# Patient Record
Sex: Female | Born: 1955 | Race: Black or African American | Hispanic: No | Marital: Married | Smoking: Never smoker
Health system: Southern US, Community
[De-identification: ages and names within clinical notes are randomized; demographics above are authoritative.]

## PROBLEM LIST (undated history)

## (undated) DIAGNOSIS — J302 Other seasonal allergic rhinitis: Secondary | ICD-10-CM

## (undated) DIAGNOSIS — E119 Type 2 diabetes mellitus without complications: Secondary | ICD-10-CM

## (undated) DIAGNOSIS — I10 Essential (primary) hypertension: Secondary | ICD-10-CM

## (undated) DIAGNOSIS — R7302 Impaired glucose tolerance (oral): Secondary | ICD-10-CM

## (undated) DIAGNOSIS — E785 Hyperlipidemia, unspecified: Secondary | ICD-10-CM

## (undated) DIAGNOSIS — K5900 Constipation, unspecified: Secondary | ICD-10-CM

## (undated) DIAGNOSIS — E669 Obesity, unspecified: Secondary | ICD-10-CM

## (undated) HISTORY — DX: Hyperlipidemia, unspecified: E78.5

## (undated) HISTORY — PX: TUBAL LIGATION: SHX77

## (undated) HISTORY — DX: Essential (primary) hypertension: I10

## (undated) HISTORY — DX: Impaired glucose tolerance (oral): R73.02

## (undated) HISTORY — DX: Constipation, unspecified: K59.00

## (undated) HISTORY — PX: CYSTECTOMY: SUR359

## (undated) HISTORY — PX: CYSTECTOMY: SHX5119

## (undated) HISTORY — DX: Obesity, unspecified: E66.9

---

## 2002-05-04 ENCOUNTER — Ambulatory Visit (HOSPITAL_COMMUNITY): Admission: RE | Admit: 2002-05-04 | Discharge: 2002-05-04 | Payer: Self-pay | Admitting: Family Medicine

## 2002-08-31 ENCOUNTER — Ambulatory Visit (HOSPITAL_COMMUNITY): Admission: RE | Admit: 2002-08-31 | Discharge: 2002-08-31 | Payer: Self-pay | Admitting: Family Medicine

## 2002-08-31 ENCOUNTER — Encounter: Payer: Self-pay | Admitting: Family Medicine

## 2005-03-17 ENCOUNTER — Ambulatory Visit: Payer: Self-pay | Admitting: Family Medicine

## 2005-03-30 ENCOUNTER — Ambulatory Visit (HOSPITAL_COMMUNITY): Admission: RE | Admit: 2005-03-30 | Discharge: 2005-03-30 | Payer: Self-pay | Admitting: Family Medicine

## 2005-11-04 ENCOUNTER — Ambulatory Visit: Payer: Self-pay | Admitting: Family Medicine

## 2006-05-25 ENCOUNTER — Other Ambulatory Visit: Admission: RE | Admit: 2006-05-25 | Discharge: 2006-05-25 | Payer: Self-pay | Admitting: Family Medicine

## 2006-05-25 ENCOUNTER — Encounter: Payer: Self-pay | Admitting: Family Medicine

## 2006-05-25 ENCOUNTER — Ambulatory Visit: Payer: Self-pay | Admitting: Family Medicine

## 2006-05-25 LAB — CONVERTED CEMR LAB: Pap Smear: NORMAL

## 2006-05-28 ENCOUNTER — Encounter: Payer: Self-pay | Admitting: Family Medicine

## 2006-05-28 LAB — CONVERTED CEMR LAB
BUN: 15 mg/dL (ref 6–23)
Basophils Absolute: 0.1 10*3/uL (ref 0.0–0.1)
Basophils Relative: 1 % (ref 0–1)
CO2: 27 meq/L (ref 19–32)
Calcium: 9.3 mg/dL (ref 8.4–10.5)
Chloride: 103 meq/L (ref 96–112)
Cholesterol: 234 mg/dL — ABNORMAL HIGH (ref 0–200)
Creatinine, Ser: 0.81 mg/dL (ref 0.40–1.20)
Eosinophils Absolute: 0.2 10*3/uL (ref 0.0–0.7)
Eosinophils Relative: 3 % (ref 0–5)
Glucose, Bld: 112 mg/dL — ABNORMAL HIGH (ref 70–99)
HCT: 41.3 % (ref 36.0–46.0)
HDL: 69 mg/dL (ref >39)
Helicobacter Pylori Antibody-IgG: 7.2 — ABNORMAL HIGH
Hemoglobin: 13 g/dL (ref 12.0–15.0)
LDL Cholesterol: 151 mg/dL — ABNORMAL HIGH (ref 0–99)
Lymphocytes Relative: 60 % — ABNORMAL HIGH (ref 12–46)
Lymphs Abs: 3.5 10*3/uL — ABNORMAL HIGH (ref 0.7–3.3)
MCHC: 31.5 g/dL (ref 30.0–36.0)
MCV: 85.3 fL (ref 78.0–100.0)
Monocytes Absolute: 0.5 10*3/uL (ref 0.2–0.7)
Monocytes Relative: 8 % (ref 3–11)
Neutro Abs: 1.7 10*3/uL (ref 1.7–7.7)
Neutrophils Relative %: 30 % — ABNORMAL LOW (ref 43–77)
Platelets: 376 10*3/uL (ref 150–400)
Potassium: 4.4 meq/L (ref 3.5–5.3)
RBC: 4.84 M/uL (ref 3.87–5.11)
RDW: 14.9 % — ABNORMAL HIGH (ref 11.5–14.0)
Sodium: 139 meq/L (ref 135–145)
TSH: 3.336 microintl units/mL (ref 0.350–5.50)
Total CHOL/HDL Ratio: 3.4
Triglycerides: 71 mg/dL (ref <150)
VLDL: 14 mg/dL (ref 0–40)
WBC: 5.9 10*3/uL (ref 4.0–10.5)

## 2006-05-31 ENCOUNTER — Encounter: Payer: Self-pay | Admitting: Family Medicine

## 2006-05-31 LAB — CONVERTED CEMR LAB: Hgb A1c MFr Bld: 6.5 % — ABNORMAL HIGH (ref 4.6–6.1)

## 2006-06-28 ENCOUNTER — Ambulatory Visit (HOSPITAL_COMMUNITY): Admission: RE | Admit: 2006-06-28 | Discharge: 2006-06-28 | Payer: Self-pay | Admitting: Family Medicine

## 2006-07-13 ENCOUNTER — Ambulatory Visit: Payer: Self-pay | Admitting: Family Medicine

## 2006-07-14 ENCOUNTER — Encounter: Payer: Self-pay | Admitting: Family Medicine

## 2006-07-14 LAB — CONVERTED CEMR LAB
Chlamydia, DNA Probe: NEGATIVE
GC Probe Amp, Genital: NEGATIVE

## 2006-07-15 ENCOUNTER — Encounter: Payer: Self-pay | Admitting: Family Medicine

## 2006-07-15 LAB — CONVERTED CEMR LAB
Candida species: NEGATIVE
Gardnerella vaginalis: NEGATIVE
Trichomonal Vaginitis: POSITIVE — AB

## 2006-07-20 ENCOUNTER — Encounter: Payer: Self-pay | Admitting: Family Medicine

## 2006-07-20 LAB — CONVERTED CEMR LAB
Glucose, 2 hour: 189 mg/dL — ABNORMAL HIGH (ref 70–139)
Glucose, Fasting: 113 mg/dL — ABNORMAL HIGH (ref 70–99)

## 2006-08-26 ENCOUNTER — Ambulatory Visit: Payer: Self-pay | Admitting: Family Medicine

## 2006-11-27 ENCOUNTER — Encounter: Payer: Self-pay | Admitting: Family Medicine

## 2006-11-27 LAB — CONVERTED CEMR LAB
BUN: 12 mg/dL (ref 6–23)
CO2: 24 meq/L (ref 19–32)
Calcium: 9.5 mg/dL (ref 8.4–10.5)
Chloride: 105 meq/L (ref 96–112)
Cholesterol: 240 mg/dL — ABNORMAL HIGH (ref 0–200)
Creatinine, Ser: 0.73 mg/dL (ref 0.40–1.20)
Glucose, Bld: 100 mg/dL — ABNORMAL HIGH (ref 70–99)
HDL: 63 mg/dL (ref >39)
LDL Cholesterol: 165 mg/dL — ABNORMAL HIGH (ref 0–99)
Potassium: 4 meq/L (ref 3.5–5.3)
Sodium: 141 meq/L (ref 135–145)
Total CHOL/HDL Ratio: 3.8
Triglycerides: 59 mg/dL (ref <150)
VLDL: 12 mg/dL (ref 0–40)

## 2006-12-03 ENCOUNTER — Ambulatory Visit: Payer: Self-pay | Admitting: Family Medicine

## 2007-01-10 ENCOUNTER — Ambulatory Visit: Payer: Self-pay | Admitting: Family Medicine

## 2007-01-27 ENCOUNTER — Ambulatory Visit: Payer: Self-pay | Admitting: Family Medicine

## 2007-03-26 ENCOUNTER — Encounter: Payer: Self-pay | Admitting: Family Medicine

## 2007-03-26 LAB — CONVERTED CEMR LAB
Cholesterol: 224 mg/dL — ABNORMAL HIGH (ref 0–200)
HDL: 59 mg/dL (ref >39)
LDL Cholesterol: 152 mg/dL — ABNORMAL HIGH (ref 0–99)
Total CHOL/HDL Ratio: 3.8
Triglycerides: 66 mg/dL (ref <150)
VLDL: 13 mg/dL (ref 0–40)

## 2007-04-06 ENCOUNTER — Ambulatory Visit: Payer: Self-pay | Admitting: Family Medicine

## 2007-07-02 ENCOUNTER — Encounter: Payer: Self-pay | Admitting: Family Medicine

## 2007-07-02 LAB — CONVERTED CEMR LAB
ALT: 9 units/L (ref 0–35)
AST: 15 units/L (ref 0–37)
Albumin: 4.1 g/dL (ref 3.5–5.2)
Alkaline Phosphatase: 89 units/L (ref 39–117)
BUN: 13 mg/dL (ref 6–23)
Bilirubin, Direct: 0.1 mg/dL (ref 0.0–0.3)
CO2: 25 meq/L (ref 19–32)
Calcium: 9.2 mg/dL (ref 8.4–10.5)
Chloride: 106 meq/L (ref 96–112)
Cholesterol: 233 mg/dL — ABNORMAL HIGH (ref 0–200)
Creatinine, Ser: 0.72 mg/dL (ref 0.40–1.20)
Glucose, Bld: 100 mg/dL — ABNORMAL HIGH (ref 70–99)
HDL: 69 mg/dL (ref >39)
Indirect Bilirubin: 0.4 mg/dL (ref 0.0–0.9)
LDL Cholesterol: 151 mg/dL — ABNORMAL HIGH (ref 0–99)
Potassium: 4.2 meq/L (ref 3.5–5.3)
Sodium: 143 meq/L (ref 135–145)
Total Bilirubin: 0.5 mg/dL (ref 0.3–1.2)
Total CHOL/HDL Ratio: 3.4
Total Protein: 7.1 g/dL (ref 6.0–8.3)
Triglycerides: 66 mg/dL (ref <150)
VLDL: 13 mg/dL (ref 0–40)

## 2007-07-12 ENCOUNTER — Other Ambulatory Visit: Admission: RE | Admit: 2007-07-12 | Discharge: 2007-07-12 | Payer: Self-pay | Admitting: Family Medicine

## 2007-07-12 ENCOUNTER — Encounter: Payer: Self-pay | Admitting: Family Medicine

## 2007-07-12 ENCOUNTER — Ambulatory Visit: Payer: Self-pay | Admitting: Family Medicine

## 2007-07-19 ENCOUNTER — Encounter: Payer: Self-pay | Admitting: Family Medicine

## 2007-07-19 DIAGNOSIS — E1169 Type 2 diabetes mellitus with other specified complication: Secondary | ICD-10-CM | POA: Insufficient documentation

## 2007-07-19 DIAGNOSIS — E1159 Type 2 diabetes mellitus with other circulatory complications: Secondary | ICD-10-CM | POA: Insufficient documentation

## 2007-07-19 DIAGNOSIS — E785 Hyperlipidemia, unspecified: Secondary | ICD-10-CM | POA: Insufficient documentation

## 2007-07-19 DIAGNOSIS — E739 Lactose intolerance, unspecified: Secondary | ICD-10-CM | POA: Insufficient documentation

## 2007-07-19 DIAGNOSIS — I10 Essential (primary) hypertension: Secondary | ICD-10-CM | POA: Insufficient documentation

## 2007-08-24 ENCOUNTER — Ambulatory Visit (HOSPITAL_COMMUNITY): Admission: RE | Admit: 2007-08-24 | Discharge: 2007-08-24 | Payer: Self-pay | Admitting: Family Medicine

## 2007-10-08 ENCOUNTER — Encounter: Payer: Self-pay | Admitting: Family Medicine

## 2007-10-08 LAB — CONVERTED CEMR LAB
Cholesterol: 231 mg/dL — ABNORMAL HIGH (ref 0–200)
HDL: 69 mg/dL (ref >39)
LDL Cholesterol: 152 mg/dL — ABNORMAL HIGH (ref 0–99)
Total CHOL/HDL Ratio: 3.3
Triglycerides: 51 mg/dL (ref <150)
VLDL: 10 mg/dL (ref 0–40)

## 2007-10-17 ENCOUNTER — Ambulatory Visit: Payer: Self-pay | Admitting: Family Medicine

## 2007-10-17 LAB — CONVERTED CEMR LAB
Glucose, Bld: 95 mg/dL
Hgb A1c MFr Bld: 6.2 %

## 2007-12-20 ENCOUNTER — Telehealth: Payer: Self-pay | Admitting: Family Medicine

## 2008-01-10 ENCOUNTER — Encounter: Payer: Self-pay | Admitting: Family Medicine

## 2008-01-16 LAB — CONVERTED CEMR LAB
ALT: 9 units/L (ref 0–35)
AST: 16 units/L (ref 0–37)
Albumin: 4.3 g/dL (ref 3.5–5.2)
Alkaline Phosphatase: 96 units/L (ref 39–117)
Bilirubin, Direct: 0.1 mg/dL (ref 0.0–0.3)
Cholesterol: 232 mg/dL — ABNORMAL HIGH (ref 0–200)
HDL: 65 mg/dL (ref >39)
Indirect Bilirubin: 0.3 mg/dL (ref 0.0–0.9)
LDL Cholesterol: 153 mg/dL — ABNORMAL HIGH (ref 0–99)
Total Bilirubin: 0.4 mg/dL (ref 0.3–1.2)
Total CHOL/HDL Ratio: 3.6
Total Protein: 7.4 g/dL (ref 6.0–8.3)
Triglycerides: 72 mg/dL (ref <150)
VLDL: 14 mg/dL (ref 0–40)

## 2008-01-17 ENCOUNTER — Ambulatory Visit: Payer: Self-pay | Admitting: Family Medicine

## 2008-01-17 DIAGNOSIS — N950 Postmenopausal bleeding: Secondary | ICD-10-CM | POA: Insufficient documentation

## 2008-01-17 LAB — CONVERTED CEMR LAB: Hgb A1c MFr Bld: 6 %

## 2008-01-19 ENCOUNTER — Telehealth: Payer: Self-pay | Admitting: Family Medicine

## 2008-02-13 ENCOUNTER — Ambulatory Visit: Payer: Self-pay | Admitting: Gastroenterology

## 2008-02-13 ENCOUNTER — Encounter: Payer: Self-pay | Admitting: Gastroenterology

## 2008-04-25 ENCOUNTER — Telehealth: Payer: Self-pay | Admitting: Family Medicine

## 2008-05-03 ENCOUNTER — Ambulatory Visit (HOSPITAL_COMMUNITY): Admission: RE | Admit: 2008-05-03 | Discharge: 2008-05-03 | Payer: Self-pay | Admitting: Internal Medicine

## 2008-05-12 ENCOUNTER — Encounter: Payer: Self-pay | Admitting: Family Medicine

## 2008-05-14 LAB — CONVERTED CEMR LAB
ALT: 8 units/L (ref 0–35)
AST: 14 units/L (ref 0–37)
Albumin: 4.3 g/dL (ref 3.5–5.2)
Alkaline Phosphatase: 85 units/L (ref 39–117)
BUN: 14 mg/dL (ref 6–23)
Bilirubin, Direct: 0.1 mg/dL (ref 0.0–0.3)
CO2: 27 meq/L (ref 19–32)
Calcium: 9.3 mg/dL (ref 8.4–10.5)
Chloride: 104 meq/L (ref 96–112)
Cholesterol: 153 mg/dL (ref 0–200)
Creatinine, Ser: 0.78 mg/dL (ref 0.40–1.20)
Glucose, Bld: 92 mg/dL (ref 70–99)
HDL: 65 mg/dL (ref >39)
Indirect Bilirubin: 0.2 mg/dL (ref 0.0–0.9)
LDL Cholesterol: 77 mg/dL (ref 0–99)
Potassium: 4.1 meq/L (ref 3.5–5.3)
Sodium: 142 meq/L (ref 135–145)
Total Bilirubin: 0.3 mg/dL (ref 0.3–1.2)
Total CHOL/HDL Ratio: 2.4
Total Protein: 7 g/dL (ref 6.0–8.3)
Triglycerides: 54 mg/dL (ref <150)
VLDL: 11 mg/dL (ref 0–40)

## 2008-05-17 ENCOUNTER — Ambulatory Visit: Payer: Self-pay | Admitting: Family Medicine

## 2008-05-20 DIAGNOSIS — E669 Obesity, unspecified: Secondary | ICD-10-CM | POA: Insufficient documentation

## 2008-05-20 DIAGNOSIS — E663 Overweight: Secondary | ICD-10-CM | POA: Insufficient documentation

## 2008-06-29 ENCOUNTER — Encounter: Payer: Self-pay | Admitting: Family Medicine

## 2008-09-17 ENCOUNTER — Encounter: Payer: Self-pay | Admitting: Family Medicine

## 2008-09-17 ENCOUNTER — Other Ambulatory Visit: Admission: RE | Admit: 2008-09-17 | Discharge: 2008-09-17 | Payer: Self-pay | Admitting: Family Medicine

## 2008-09-17 ENCOUNTER — Ambulatory Visit: Payer: Self-pay | Admitting: Family Medicine

## 2008-09-17 LAB — CONVERTED CEMR LAB
Hgb A1c MFr Bld: 6.2 %
OCCULT 1: NEGATIVE

## 2008-09-21 ENCOUNTER — Telehealth: Payer: Self-pay | Admitting: Family Medicine

## 2008-10-03 ENCOUNTER — Ambulatory Visit (HOSPITAL_COMMUNITY): Admission: RE | Admit: 2008-10-03 | Discharge: 2008-10-03 | Payer: Self-pay | Admitting: Pediatrics

## 2009-01-01 ENCOUNTER — Telehealth: Payer: Self-pay | Admitting: Family Medicine

## 2009-01-15 ENCOUNTER — Ambulatory Visit: Payer: Self-pay | Admitting: Family Medicine

## 2009-01-15 LAB — CONVERTED CEMR LAB: Hgb A1c MFr Bld: 6 %

## 2009-01-16 LAB — CONVERTED CEMR LAB
ALT: 11 units/L (ref 0–35)
AST: 16 units/L (ref 0–37)
Albumin: 4.3 g/dL (ref 3.5–5.2)
Alkaline Phosphatase: 82 units/L (ref 39–117)
BUN: 11 mg/dL (ref 6–23)
Basophils Absolute: 0 10*3/uL (ref 0.0–0.1)
Basophils Relative: 1 % (ref 0–1)
Bilirubin, Direct: 0.1 mg/dL (ref 0.0–0.3)
CO2: 23 meq/L (ref 19–32)
Calcium: 9.6 mg/dL (ref 8.4–10.5)
Chloride: 105 meq/L (ref 96–112)
Cholesterol: 174 mg/dL (ref 0–200)
Creatinine, Ser: 0.7 mg/dL (ref 0.40–1.20)
Eosinophils Absolute: 0.1 10*3/uL (ref 0.0–0.7)
Eosinophils Relative: 2 % (ref 0–5)
Glucose, Bld: 113 mg/dL — ABNORMAL HIGH (ref 70–99)
HCT: 38.5 % (ref 36.0–46.0)
HDL: 64 mg/dL (ref >39)
Hemoglobin: 12.6 g/dL (ref 12.0–15.0)
Indirect Bilirubin: 0.2 mg/dL (ref 0.0–0.9)
LDL Cholesterol: 102 mg/dL — ABNORMAL HIGH (ref 0–99)
Lymphocytes Relative: 53 % — ABNORMAL HIGH (ref 12–46)
Lymphs Abs: 2.4 10*3/uL (ref 0.7–4.0)
MCHC: 32.7 g/dL (ref 30.0–36.0)
MCV: 82.8 fL (ref 78.0–100.0)
Monocytes Absolute: 0.3 10*3/uL (ref 0.1–1.0)
Monocytes Relative: 8 % (ref 3–12)
Neutro Abs: 1.7 10*3/uL (ref 1.7–7.7)
Neutrophils Relative %: 38 % — ABNORMAL LOW (ref 43–77)
Platelets: 349 10*3/uL (ref 150–400)
Potassium: 3.9 meq/L (ref 3.5–5.3)
RBC: 4.65 M/uL (ref 3.87–5.11)
RDW: 14.8 % (ref 11.5–15.5)
Sodium: 143 meq/L (ref 135–145)
TSH: 3.518 microintl units/mL (ref 0.350–4.500)
Total Bilirubin: 0.3 mg/dL (ref 0.3–1.2)
Total CHOL/HDL Ratio: 2.7
Total Protein: 7 g/dL (ref 6.0–8.3)
Triglycerides: 41 mg/dL (ref <150)
VLDL: 8 mg/dL (ref 0–40)
WBC: 4.5 10*3/uL (ref 4.0–10.5)

## 2009-01-25 ENCOUNTER — Telehealth: Payer: Self-pay | Admitting: Family Medicine

## 2009-02-11 ENCOUNTER — Telehealth: Payer: Self-pay | Admitting: Family Medicine

## 2009-03-05 ENCOUNTER — Telehealth: Payer: Self-pay | Admitting: Family Medicine

## 2009-06-03 ENCOUNTER — Telehealth: Payer: Self-pay | Admitting: Family Medicine

## 2009-07-02 ENCOUNTER — Encounter (INDEPENDENT_AMBULATORY_CARE_PROVIDER_SITE_OTHER): Payer: Self-pay

## 2009-07-02 ENCOUNTER — Ambulatory Visit: Payer: Self-pay | Admitting: Family Medicine

## 2009-07-02 ENCOUNTER — Other Ambulatory Visit: Admission: RE | Admit: 2009-07-02 | Discharge: 2009-07-02 | Payer: Self-pay | Admitting: Family Medicine

## 2009-07-02 DIAGNOSIS — M79609 Pain in unspecified limb: Secondary | ICD-10-CM | POA: Insufficient documentation

## 2009-07-02 DIAGNOSIS — R7301 Impaired fasting glucose: Secondary | ICD-10-CM | POA: Insufficient documentation

## 2009-07-02 DIAGNOSIS — H6120 Impacted cerumen, unspecified ear: Secondary | ICD-10-CM | POA: Insufficient documentation

## 2009-07-02 LAB — CONVERTED CEMR LAB: OCCULT 1: NEGATIVE

## 2009-07-05 ENCOUNTER — Telehealth: Payer: Self-pay | Admitting: Family Medicine

## 2009-07-05 LAB — CONVERTED CEMR LAB
ALT: 15 units/L (ref 0–35)
AST: 20 units/L (ref 0–37)
Albumin: 4.4 g/dL (ref 3.5–5.2)
Alkaline Phosphatase: 89 units/L (ref 39–117)
BUN: 12 mg/dL (ref 6–23)
Bilirubin, Direct: 0.1 mg/dL (ref 0.0–0.3)
CO2: 27 meq/L (ref 19–32)
Calcium: 9.9 mg/dL (ref 8.4–10.5)
Chloride: 102 meq/L (ref 96–112)
Cholesterol: 186 mg/dL (ref 0–200)
Creatinine, Ser: 0.75 mg/dL (ref 0.40–1.20)
Glucose, Bld: 108 mg/dL — ABNORMAL HIGH (ref 70–99)
HDL: 62 mg/dL (ref >39)
Hgb A1c MFr Bld: 6.3 % — ABNORMAL HIGH (ref <5.7)
Indirect Bilirubin: 0.3 mg/dL (ref 0.0–0.9)
LDL Cholesterol: 110 mg/dL — ABNORMAL HIGH (ref 0–99)
Potassium: 4.1 meq/L (ref 3.5–5.3)
Sodium: 140 meq/L (ref 135–145)
Total Bilirubin: 0.4 mg/dL (ref 0.3–1.2)
Total CHOL/HDL Ratio: 3
Total Protein: 7.2 g/dL (ref 6.0–8.3)
Triglycerides: 71 mg/dL (ref <150)
VLDL: 14 mg/dL (ref 0–40)

## 2009-07-08 ENCOUNTER — Telehealth: Payer: Self-pay | Admitting: Family Medicine

## 2009-07-08 LAB — CONVERTED CEMR LAB: Pap Smear: NEGATIVE

## 2009-07-15 ENCOUNTER — Telehealth: Payer: Self-pay | Admitting: Family Medicine

## 2009-07-15 DIAGNOSIS — M25559 Pain in unspecified hip: Secondary | ICD-10-CM | POA: Insufficient documentation

## 2009-07-17 ENCOUNTER — Encounter: Payer: Self-pay | Admitting: Orthopedic Surgery

## 2009-07-17 ENCOUNTER — Ambulatory Visit (HOSPITAL_COMMUNITY): Admission: RE | Admit: 2009-07-17 | Discharge: 2009-07-17 | Payer: Self-pay | Admitting: Family Medicine

## 2009-07-18 ENCOUNTER — Telehealth: Payer: Self-pay | Admitting: Family Medicine

## 2009-09-12 ENCOUNTER — Ambulatory Visit: Payer: Self-pay | Admitting: Family Medicine

## 2009-09-12 DIAGNOSIS — L259 Unspecified contact dermatitis, unspecified cause: Secondary | ICD-10-CM | POA: Insufficient documentation

## 2009-09-19 ENCOUNTER — Ambulatory Visit: Payer: Self-pay | Admitting: Otolaryngology

## 2009-09-19 ENCOUNTER — Encounter: Payer: Self-pay | Admitting: Family Medicine

## 2009-09-24 DIAGNOSIS — B369 Superficial mycosis, unspecified: Secondary | ICD-10-CM | POA: Insufficient documentation

## 2009-10-14 ENCOUNTER — Ambulatory Visit: Payer: Self-pay | Admitting: Orthopedic Surgery

## 2009-10-14 DIAGNOSIS — M5430 Sciatica, unspecified side: Secondary | ICD-10-CM | POA: Insufficient documentation

## 2009-10-14 DIAGNOSIS — M549 Dorsalgia, unspecified: Secondary | ICD-10-CM | POA: Insufficient documentation

## 2009-10-18 ENCOUNTER — Telehealth: Payer: Self-pay | Admitting: Orthopedic Surgery

## 2009-11-12 ENCOUNTER — Telehealth (INDEPENDENT_AMBULATORY_CARE_PROVIDER_SITE_OTHER): Payer: Self-pay | Admitting: *Deleted

## 2009-11-18 LAB — CONVERTED CEMR LAB
ALT: 15 units/L (ref 0–35)
AST: 22 units/L (ref 0–37)
Albumin: 4.3 g/dL (ref 3.5–5.2)
Alkaline Phosphatase: 92 units/L (ref 39–117)
Bilirubin, Direct: 0.1 mg/dL (ref 0.0–0.3)
Cholesterol: 216 mg/dL — ABNORMAL HIGH (ref 0–200)
HDL: 67 mg/dL (ref >39)
Hgb A1c MFr Bld: 6.1 % — ABNORMAL HIGH (ref <5.7)
Indirect Bilirubin: 0.3 mg/dL (ref 0.0–0.9)
LDL Cholesterol: 138 mg/dL — ABNORMAL HIGH (ref 0–99)
Total Bilirubin: 0.4 mg/dL (ref 0.3–1.2)
Total CHOL/HDL Ratio: 3.2
Total Protein: 6.9 g/dL (ref 6.0–8.3)
Triglycerides: 57 mg/dL (ref <150)
VLDL: 11 mg/dL (ref 0–40)

## 2009-11-20 ENCOUNTER — Ambulatory Visit: Payer: Self-pay | Admitting: Family Medicine

## 2009-12-02 ENCOUNTER — Ambulatory Visit (HOSPITAL_COMMUNITY): Admission: RE | Admit: 2009-12-02 | Discharge: 2009-12-02 | Payer: Self-pay | Admitting: Family Medicine

## 2009-12-03 ENCOUNTER — Telehealth: Payer: Self-pay | Admitting: Orthopedic Surgery

## 2010-02-10 ENCOUNTER — Encounter: Payer: Self-pay | Admitting: Family Medicine

## 2010-02-18 ENCOUNTER — Telehealth: Payer: Self-pay | Admitting: Family Medicine

## 2010-02-20 NOTE — Progress Notes (Signed)
Summary: bw results  Phone Note Call from Patient   Summary of Call: wants results of blood work Initial call taken by: Lind Guest,  January 25, 2009 10:28 AM  Follow-up for Phone Call        returned call, left message Follow-up by: Worthy Keeler LPN,  January 25, 2009 10:30 AM  Additional Follow-up for Phone Call Additional follow up Details #1::        patient aware Additional Follow-up by: Worthy Keeler LPN,  January 25, 2009 11:46 AM

## 2010-02-20 NOTE — Letter (Signed)
Summary: Work Excuse  [facility]  [address]   [place], [place] [address]   Phone: [phone]  Fax: [phone]    Today's Date: July 02, 2009  Name of Patient: Megan Villa  The above named patient had a medical visit today at: 3:00pm.  Please take this into consideration when reviewing the time away from work/school.    Special Instructions:  [  ] None  [*  ] To be off the remainder of today, returning to the normal work / school schedule tomorrow.  [  ] To be off until the next scheduled appointment on ______________________.  [  ] Other ________________________________________________________________ ________________________________________________________________________   Sincerely yours,   Milus Mallick. Lodema Hong, M.D.

## 2010-02-20 NOTE — Progress Notes (Signed)
Summary: lovastatin 40 mg  Phone Note Call from Patient   Summary of Call: patient needs a prescription sent to [facility] in [place] for her lovastatin 40 mg. Initial call taken by: Curtis Sites,  November 12, 2009 5:02 PM  Follow-up for Phone Call        pls fax printed script to pharmacy after you spk with the pt Follow-up by: Syliva Overman MD,  November 12, 2009 5:53 PM  Additional Follow-up for Phone Call Additional follow up Details #1::        Rx Called In Additional Follow-up by: Adella Hare LPN,  November 13, 2009 1:08 PM    New/Updated Medications: LOVASTATIN 40 MG TABS (LOVASTATIN) Take 1 tab by mouth at bedtime Prescriptions: LOVASTATIN 40 MG TABS (LOVASTATIN) Take 1 tab by mouth at bedtime  #90 x 1   Entered by:   Syliva Overman MD   Authorized by:   Adella Hare LPN   Signed by:   Syliva Overman MD on 11/12/2009   Method used:   Printed then faxed to ...       [facility] [address] [facility] (retail)       [address]       [place], [place]  [address]       Ph: [phone]       Fax: [phone]   RxID:   [id number]

## 2010-02-20 NOTE — Progress Notes (Signed)
Summary: DR. Russella Dar  Phone Note Call from Patient   Summary of Call: FOR HER COLONOSCOPY SHE WANTS TO GO TO DR. Russella Dar IN [place] Initial call taken by: Lind Guest,  January 19, 2008 10:07 AM  Follow-up for Phone Call        pt has appt for consultation on jan 12th 9:45. and also a colon on jan 27th 7:30.  Follow-up by: Rudene Anda,  January 23, 2008 9:34 AM

## 2010-02-20 NOTE — Progress Notes (Signed)
Summary: medication side effect?  Phone Note Call from Patient   Caller: Patient Summary of Call: Patient relates that she had taken the Gabapentin and the Terbinifine at the same time, on 10/16/09, before bedtime, and had nausea and vomiting the next morning. Has not taken either since then. Her cell ph# [phone] Initial call taken by: Cammie Sickle,  October 18, 2009 1:01 PM  Follow-up for Phone Call        ok Follow-up by: Ether Griffins,  October 21, 2009 8:36 AM

## 2010-02-20 NOTE — Progress Notes (Signed)
Summary: patient did not wish to keep fol/up appt  Phone Note Outgoing Call   Summary of Call: When our office Steward Drone) called patient to remind of appt for tomorrow 12/04/09, patient stated she did not wish to keep the appointment. She stated that she had called after last visit about a question on the medication and that she did not receive a call back.   Steward Drone was about to defer her to me; or to Dr Romeo Apple; patient said to "just cancel the appointment." Initial call taken by: Cammie Sickle,  December 03, 2009 4:12 PM

## 2010-02-20 NOTE — Letter (Signed)
Summary: External Correspondence  External Correspondence   Imported By: Rudene Anda 06/29/2008 13:10:05  _____________________________________________________________________  External Attachment:    Type:   Image     Comment:   External Document

## 2010-02-20 NOTE — Progress Notes (Signed)
Summary: refill  Phone Note Call from Patient   Summary of Call: has no refills on her pot. pills [facility] [place]  appt in june  call back at [phone] to let her know Initial call taken by: Lind Guest,  Jun 03, 2009 8:10 AM  Follow-up for Phone Call        Rx Called In Follow-up by: Adella Hare LPN,  Jun 03, 2009 9:12 AM    Prescriptions: KLOR-CON M20 20 MEQ  CR-TABS (POTASSIUM CHLORIDE CRYS CR) one tab by mouth once daily  #30 Each x 0   Entered by:   Adella Hare LPN   Authorized by:   Syliva Overman MD   Signed by:   Adella Hare LPN on 16/10/9602   Method used:   Electronically to        [facility]  [address] (retail)       [address]       [place], [place]  [address]       Ph: [phone]       Fax: [phone]   RxID:   [id number]

## 2010-02-20 NOTE — Progress Notes (Signed)
  Phone Note Call from Patient   Summary of Call: The prednisone isn't working and she is still hurting on the right side of her butt like she has a pulled muscle. Wants to know what you want to do about it.  [facility] Initial call taken by: Everitt Amber LPN,  July 08, 2009 3:13 PM  Follow-up for Phone Call        advise muscle relaxant has been sent in see if she wants x ray of the hip if so pls order one verify with pt befope you order exactly whivch hip Follow-up by: Syliva Overman MD,  July 08, 2009 3:39 PM  Additional Follow-up for Phone Call Additional follow up Details #1::        patient aware patient says if gets no better, will do xray then Additional Follow-up by: Adella Hare LPN,  July 08, 2009 5:15 PM    New/Updated Medications: CYCLOBENZAPRINE HCL 10 MG TABS (CYCLOBENZAPRINE HCL) Take 1 tab by mouth at bedtime as needed Prescriptions: CYCLOBENZAPRINE HCL 10 MG TABS (CYCLOBENZAPRINE HCL) Take 1 tab by mouth at bedtime as needed  #30 x 0   Entered and Authorized by:   Syliva Overman MD   Signed by:   Syliva Overman MD on 07/08/2009   Method used:   Electronically to        [facility]  [address] (retail)       [address]       [place], [place]  [address]       Ph: [phone]       Fax: [phone]   RxID:   [id number]

## 2010-02-20 NOTE — Progress Notes (Signed)
Summary: x-ray  Phone Note Call from Patient   Summary of Call: pt would like to get a x-ray of side of leg. she said yall talked about it when she was in. and muscle relaxer not working. [phone] Initial call taken by: Rudene Anda,  July 15, 2009 8:18 AM  Follow-up for Phone Call        pls order x ray of right side of foot , ensure that is where she hurts, have her go get the film complaint is pain Follow-up by: Syliva Overman MD,  July 16, 2009 12:22 PM  Additional Follow-up for Phone Call Additional follow up Details #1::        returned call, left message Additional Follow-up by: Adella Hare LPN,  July 16, 2009 2:32 PM  New Problems: HIP PAIN, RIGHT (ICD-719.45)   Additional Follow-up for Phone Call Additional follow up Details #2::    patient complains of pain from right hip, into buttock,  down leg Follow-up by: Adella Hare LPN,  July 16, 2009 3:08 PM  Additional Follow-up for Phone Call Additional follow up Details #3:: Details for Additional Follow-up Action Taken: PLS ORDER X RAY OF RIGHT HIP, LET HERKNOW IF THIS DOES NOT SHE ANYTHING SHE WILL NEED TO SEE ORTHO ABOUT IT, THANKS Additional Follow-up by: Syliva Overman MD,  July 16, 2009 3:19 PM  New Problems: HIP PAIN, RIGHT (ICD-719.45) x ray ordered, called patient, left message  patient aware Adella Hare LPN  July 17, 2009 4:41 PM

## 2010-02-20 NOTE — Progress Notes (Signed)
Summary: flonax  Phone Note Call from Patient   Summary of Call: [facility] [place] call at [phone] Initial call taken by: Lind Guest,  February 11, 2009 8:07 AM  Follow-up for Phone Call        Rx Called In Follow-up by: Worthy Keeler LPN,  February 11, 2009 10:50 AM    Prescriptions: Aleda Grana 50 MCG/ACT SUSP (FLUTICASONE PROPIONATE) 1 puff per nostril tweice daily as needed  #3 x 2   Entered by:   Worthy Keeler LPN   Authorized by:   Syliva Overman MD   Signed by:   Worthy Keeler LPN on 60/45/4098   Method used:   Electronically to        [facility]  [address] (retail)       [address]       [place], [place]  [address]       Ph: [phone]       Fax: [phone]   RxID:   [id number]

## 2010-02-20 NOTE — Assessment & Plan Note (Signed)
Summary: office visit   Vital Signs:  Patient Profile:   55 Years Old Female Weight:      184 pounds O2 Sat:      98 % Pulse rate:   71 / minute Pulse rhythm:   regular Resp:     16 per minute BP sitting:   118 / 80  (left arm)  Pt. in pain?   no  Vitals Entered By: Everitt Amber (October 17, 2007 4:45 PM)                  Chief Complaint:  follw up chronic problems and review of recent labs.  History of Present Illness: Patient reoports that she has been doing well. She is exercising regularly and reducing caloric intake to assist with weight loss and improved blood sugar.  She denies any recent fever or chills. She denies depression, anxiety or insomnia.    Current Allergies: No known allergies      Review of Systems  ENT      See HPI      Denies decreased hearing, difficulty swallowing, ear discharge, earache, hoarseness, nasal congestion, nosebleeds, postnasal drainage, ringing in ears, sinus pressure, and sore throat.  CV      Denies bluish discoloration of lips or nails, chest pain or discomfort, difficulty breathing at night, difficulty breathing while lying down, fainting, fatigue, leg cramps with exertion, lightheadness, near fainting, palpitations, shortness of breath with exertion, swelling of feet, swelling of hands, and weight gain.  Resp      Denies chest discomfort, chest pain with inspiration, cough, coughing up blood, excessive snoring, hypersomnolence, morning headaches, pleuritic, shortness of breath, sputum productive, and wheezing.  GU      Denies abnormal vaginal bleeding, decreased libido, discharge, dysuria, genital sores, hematuria, incontinence, nocturia, urinary frequency, and urinary hesitancy.  MS      Denies joint pain, muscle weakness, and stiffness.  Endo      Denies cold intolerance, excessive thirst, and excessive urination.  Allergy      Denies hives or rash, itching eyes, and sneezing.   Physical Exam  General:  overweight-appearing.   Head:     Normocephalic and atraumatic without obvious abnormalities. No apparent alopecia or balding. Eyes:     vision grossly intact.   Ears:     R ear normal and L ear normal.   Nose:     no nasal discharge.   Mouth:     Oral mucosa and oropharynx without lesions or exudates.  Teeth in good repair. Neck:     No deformities, masses, or tenderness noted. Lungs:     Normal respiratory effort, chest expands symmetrically. Lungs are clear to auscultation, no crackles or wheezes. Heart:     Normal rate and regular rhythm. S1 and S2 normal without gallop, murmur, click, rub or other extra sounds. Abdomen:     soft and non-tender.   Extremities:     No clubbing, cyanosis, edema, or deformity noted with normal full range of motion of all joints.   Neurologic:     alert & oriented X3, cranial nerves II-XII intact, strength normal in all extremities, sensation intact to light touch, and gait normal.   Skin:     Intact without suspicious lesions or rashes Psych:     Cognition and judgment appear intact. Alert and cooperative with normal attention span and concentration. No apparent delusions, illusions, hallucinations    Impression & Recommendations:  Problem # 1:  IMPAIRED GLUCOSE TOLERANCE (ICD-271.3)  Assessment: Improved  Orders: Glucose, (CBG) (82962) 95 Hemoglobin A1C (83036)  6.2  Orders: Glucose, (CBG) (82962) Hemoglobin A1C (83036)   Problem # 2:  OBESITY (ICD-278.00) Assessment: Improved  Problem # 3:  HYPERLIPIDEMIA (ICD-272.4) Assessment: Deteriorated  Her updated medication list for this problem includes:    Lovastatin 40 Mg Tabs (Lovastatin) .Marland Kitchen... Take 1 tab by mouth at bedtime  Orders: T-Hepatic Function [id number]) T-Lipid Profile [phone])  Labs Reviewed: Chol: 233 (07/02/2007)   HDL: 69 (07/02/2007)   LDL: 151 (07/02/2007)   TG: 66 (07/02/2007) SGOT: 15 (07/02/2007)   SGPT: 9 (07/02/2007)   Problem # 4:  HYPERTENSION  (ICD-401.9) Assessment: Improved  Her updated medication list for this problem includes:    Hydrochlorothiazide 25 Mg Tabs (Hydrochlorothiazide) ..... One tab by mouth once daily   Complete Medication List: 1)  Klor-con M20 20 Meq Cr-tabs (Potassium chloride crys cr) .... One tab by mouth once daily 2)  Hydrochlorothiazide 25 Mg Tabs (Hydrochlorothiazide) .... One tab by mouth once daily 3)  Flonase 50 Mcg/act Susp (Fluticasone propionate) .... 2 puffs per nostril daily 4)  Lovastatin 40 Mg Tabs (Lovastatin) .... Take 1 tab by mouth at bedtime   Patient Instructions: 1)  Please schedule a follow-up appointment in 3 months. 2)  Hepatic Panel prior to visit, ICD-9: 3)  Lipid Panel prior to visit, ICD-9: 4)  Please start new med for cholesterol andcontinue to follow low fat diet and exercise regularly.   Prescriptions: LOVASTATIN 40 MG TABS (LOVASTATIN) Take 1 tab by mouth at bedtime  #30 x 4   Entered and Authorized by:   Syliva Overman MD   Signed by:   Syliva Overman MD on 10/20/2007   Method used:   Electronically to        [facility]  [address] [facility]ail)       [address]       [place], [place]  [address]       Ph: [phone]       Fax: [phone]   RxID:   [id number]  ]   Laboratory Results   Blood Tests   Date/Time Received: 10/17/07 4:30pm Date/Time Reported: 10/17/07 4:30pm  Glucose (random): 95 mg/dL   (Normal Range: 95-638) HGBA1C: 6.2%   (Normal Range: Non-Diabetic - 3-6%   Control Diabetic - 6-8%)

## 2010-02-20 NOTE — Miscellaneous (Signed)
Summary: GI PV  Clinical Lists Changes  Medications: Added new medication of MOVIPREP 100 GM  SOLR (PEG-KCL-NACL-NASULF-NA ASC-C) As per prep instructions. - Signed Rx of MOVIPREP 100 GM  SOLR (PEG-KCL-NACL-NASULF-NA ASC-C) As per prep instructions.;  #1 x 0;  Signed;  Entered by: Barton Fanny RN;  Authorized by: Meryl Dare MD [facility];  Method used: Electronically to [facility] [address], [place], [place], [place]  [address], Ph: [phone], Fax: [phone] Observations: Added new observation of ALLERGY REV: Done (02/13/2008 15:11)    Prescriptions: MOVIPREP 100 GM  SOLR (PEG-KCL-NACL-NASULF-NA ASC-C) As per prep instructions.  #1 x 0   Entered by:   Barton Fanny RN   Authorized by:   Meryl Dare MD [facility]   Signed by:   Barton Fanny RN on 02/13/2008   Method used:   Electronically to        [facility] (retail)       [address]       [place], [place]  [address]       Ph: [phone]       Fax: [phone]   RxID:   [id number]

## 2010-02-20 NOTE — Letter (Signed)
Summary: Work Excuse  [facility]  [address]   [place], [place] [address]   Phone: [phone]  Fax: [phone]    Today's Date: September 17, 2008  Name of Patient: Megan Villa  The above named patient had a medical visit today.  Please take this into consideration when reviewing the time away from work/school.    Special Instructions:  [ * ] None  [  ] To be off the remainder of today, returning to the normal work / school schedule tomorrow.  [  ] To be off until the next scheduled appointment on ______________________.  [  ] Other ________________________________________________________________ ________________________________________________________________________   Sincerely yours,   Worthy Keeler LPN

## 2010-02-20 NOTE — Assessment & Plan Note (Signed)
Summary: physical   Vital Signs:  Patient profile:   55 year old female Menstrual status:  irregular Height:      68 inches Weight:      184.25 pounds BMI:     28.12 O2 Sat:      99 % Pulse rate:   68 / minute Pulse rhythm:   regular Resp:     16 per minute BP sitting:   130 / 78  (left arm) Cuff size:   large  Vitals Entered By: Everitt Amber (September 17, 2008 8:36 AM)  Nutrition Counseling: Patient's BMI is greater than 25 and therefore counseled on weight management options. CC: CPE Is Patient Diabetic? No  Vision Screening:Left eye w/o correction: 20 / 25 Right Eye w/o correction: 20 / 25 Both eyes w/o correction:  20/ 20  Color vision testing: normal      Vision Entered By: Everitt Amber (September 17, 2008 8:46 AM)   CC:  CPE.  History of Present Illness: Patient reports doing well.  Denies any recent fever or chills.  Denies any appetite change or change in bowel movements. Patient denies depression, anxiety or insomnia. the pt has unfortunately not been as diligent with diet AND exercise as in the past, so she has gained a considerablke amt of weight since her last visit.She denies symptoms of infection in recent times.  Current Medications (verified): 1)  Klor-Con M20 20 Meq  Cr-Tabs (Potassium Chloride Crys Cr) .... One Tab By Mouth Once Daily 2)  Hydrochlorothiazide 25 Mg  Tabs (Hydrochlorothiazide) .... One Tab By Mouth Once Daily 3)  Flonase 50 Mcg/act Susp (Fluticasone Propionate) .Marland Kitchen.. 1 Puff Per Nostril Tweice Daily As Needed 4)  Lovastatin 40 Mg Tabs (Lovastatin) .... Take 1 Tablet By Mouth Once A Day  Allergies (verified): 1)  ! Penicillin  Review of Systems      See HPI General:  Denies chills and fever. ENT:  Denies hoarseness, nasal congestion, sinus pressure, and sore throat. CV:  Denies chest pain or discomfort, palpitations, and swelling of feet. Resp:  Denies cough and sputum productive. GI:  Denies abdominal pain, constipation, diarrhea,  nausea, and vomiting. GU:  Denies dysuria and urinary frequency. MS:  Denies joint pain and stiffness. Derm:  Denies itching, lesion(s), and rash. Neuro:  Denies headaches, seizures, and sensation of room spinning. Psych:  Denies anxiety and depression. Endo:  Denies cold intolerance, excessive hunger, excessive thirst, excessive urination, heat intolerance, polyuria, and weight change. Heme:  Denies abnormal bruising and bleeding. Allergy:  Denies hives or rash and itching eyes.  Physical Exam  General:  alert, well-hydrated, and overweight-appearing.   Head:  Normocephalic and atraumatic without obvious abnormalities. No apparent alopecia or balding. Eyes:  No corneal or conjunctival inflammation noted. EOMI. Perrla. Funduscopic exam benign, without hemorrhages, exudates or papilledema. Vision grossly normal. Ears:  External ear exam shows no significant lesions or deformities.  Otoscopic examination reveals clear canals, tympanic membranes are intact bilaterally without bulging, retraction, inflammation or discharge. Hearing is grossly normal bilaterally. Nose:  External nasal examination shows no deformity or inflammation. Nasal mucosa are pink and moist without lesions or exudates. Mouth:  Oral mucosa and oropharynx without lesions or exudates.  Teeth in good repair. Neck:  No deformities, masses, or tenderness noted. Chest Wall:  No deformities, masses, or tenderness noted. Breasts:  No mass, nodules, thickening, tenderness, bulging, retraction, inflamation, nipple discharge or skin changes noted.   Lungs:  Normal respiratory effort, chest expands symmetrically. Lungs are clear  to auscultation, no crackles or wheezes. Heart:  Normal rate and regular rhythm. S1 and S2 normal without gallop, murmur, click, rub or other extra sounds. Abdomen:  Bowel sounds positive,abdomen soft and non-tender without masses, organomegaly or hernias noted. Rectal:  No external abnormalities noted. Normal  sphincter tone. No rectal masses or tenderness.Guaic negative stool Genitalia:  Normal introitus for age, no external lesions, no vaginal discharge, mucosa pink and moist, no vaginal or cervical lesions, no vaginal atrophy, no friaility or hemorrhage, normal uterus size and position, no adnexal masses or tenderness Msk:  No deformity or scoliosis noted of thoracic or lumbar spine.   Pulses:  R and L carotid,radial,femoral,dorsalis pedis and posterior tibial pulses are full and equal bilaterally Extremities:  No clubbing, cyanosis, edema, or deformity noted with normal full range of motion of all joints.   Neurologic:  No cranial nerve deficits noted. Station and gait are normal. Plantar reflexes are down-going bilaterally. DTRs are symmetrical throughout. Sensory, motor and coordinative functions appear intact. Skin:  Intact without suspicious lesions or rashes Cervical Nodes:  No lymphadenopathy noted Axillary Nodes:  No palpable lymphadenopathy Inguinal Nodes:  No significant adenopathy Psych:  Cognition and judgment appear intact. Alert and cooperative with normal attention span and concentration. No apparent delusions, illusions, hallucinations   Impression & Recommendations:  Problem # 1:  OVERWEIGHT (ICD-278.02) Assessment Deteriorated  Ht: 68 (09/17/2008)   Wt: 184.25 (09/17/2008)   BMI: 28.12 (09/17/2008)  Problem # 2:  HYPERLIPIDEMIA (ICD-272.4) Assessment: Comment Only  Her updated medication list for this problem includes:    Lovastatin 40 Mg Tabs (Lovastatin) .Marland Kitchen... Take 1 tablet by mouth once a day  Labs Reviewed: SGOT: 14 (05/12/2008)   SGPT: 8 (05/12/2008)   HDL:65 (05/12/2008), 65 (01/10/2008)  LDL:77 (05/12/2008), 153 (62/13/0865)  Chol:153 (05/12/2008), 232 (01/10/2008)  Trig:54 (05/12/2008), 72 (01/10/2008)  Problem # 3:  IMPAIRED GLUCOSE TOLERANCE (ICD-271.3) Assessment: Comment Only HbA1C 6.2, pt to address lifestyle issues diligently  Problem # 4:  HYPERTENSION  (ICD-401.9) Assessment: Deteriorated  Her updated medication list for this problem includes:    Hydrochlorothiazide 25 Mg Tabs (Hydrochlorothiazide) ..... One tab by mouth once daily  BP today: 130/78 Prior BP: 122/70 (05/17/2008)  Labs Reviewed: K+: 4.1 (05/12/2008) Creat: : 0.78 (05/12/2008)   Chol: 153 (05/12/2008)   HDL: 65 (05/12/2008)   LDL: 77 (05/12/2008)   TG: 54 (05/12/2008)  Complete Medication List: 1)  Klor-con M20 20 Meq Cr-tabs (Potassium chloride crys cr) .... One tab by mouth once daily 2)  Hydrochlorothiazide 25 Mg Tabs (Hydrochlorothiazide) .... One tab by mouth once daily 3)  Flonase 50 Mcg/act Susp (Fluticasone propionate) .Marland Kitchen.. 1 puff per nostril tweice daily as needed 4)  Lovastatin 40 Mg Tabs (Lovastatin) .... Take 1 tablet by mouth once a day  Other Orders: Pap Smear (78469) Hemoccult Guaiac-1 spec.(in office) (62952) Radiology Referral (Radiology)  Patient Instructions: 1)  Please schedule a follow-up appointment in 4 months. 2)  It is important that you exercise regularly at least 40 minutes 6 times a week. If you develop chest pain, have severe difficulty breathing, or feel very tired , stop exercising immediately and seek medical attention. 3)  You need to lose weight. Consider a lower calorie diet and regular exercise.  4)  No med changes  at this time Prescriptions: HYDROCHLOROTHIAZIDE 25 MG  TABS (HYDROCHLOROTHIAZIDE) one tab by mouth once daily  #30 x 5   Entered by:   Worthy Keeler LPN   Authorized by:   Claris Che  Lodema Hong MD   Signed by:   Worthy Keeler LPN on 62/83/1517   Method used:   Electronically to        [facility] [facility] (retail)       [address]       [place], [place]  [address]       Ph: [phone]       Fax: [phone]   RxID:   [id number] LOVASTATIN 40 MG TABS (LOVASTATIN) Take 1 tablet by mouth once a day  #30 x 5   Entered by:   Worthy Keeler LPN   Authorized by:   Syliva Overman MD   Signed by:    Worthy Keeler LPN on 93/81/0175   Method used:   Electronically to        [facility] [address] [facility] (retail)       [address]       [place], [place]  [address]       Ph: [phone]       Fax: [phone]   RxID:   [id number] HYDROCHLOROTHIAZIDE 25 MG  TABS (HYDROCHLOROTHIAZIDE) one tab by mouth once daily  #30 x 5   Entered by:   Worthy Keeler LPN   Authorized by:   Syliva Overman MD   Signed by:   Worthy Keeler LPN on 45/80/9983   Method used:   Electronically to        [facility] [address] [facility] (retail)       [address]       [place], [place]  [address]       Ph: [phone]       Fax: [phone]   RxID:   [id number]     Laboratory Results   Blood Tests   Date/Time Received: 09/17/08 10am Date/Time Reported: 09/17/08 10am  HGBA1C: 6.2%   (Normal Range: Non-Diabetic - 3-6%   Control Diabetic - 6-8%)  Date/Time Received: 09/17/08 10am Date/Time Reported: 09/17/08 10am  Stool - Occult Blood Hemmoccult #1: negative Date: 09/17/2008 Comments: [id number] 11L 8/11 118 10/12

## 2010-02-20 NOTE — Assessment & Plan Note (Signed)
Summary: office visit   Vital Signs:  Patient profile:   55 year old female Menstrual status:  irregular Height:      68 inches (172.72 cm) Weight:      173.31 pounds (78.78 kg) BMI:     26.45 O2 Sat:      98 % Pulse rate:   77 / minute Pulse rhythm:   regular Resp:     16 per minute BP sitting:   122 / 70  (left arm) Cuff size:   large  Vitals Entered By: Everitt Amber (May 17, 2008 3:47 PM)  Nutrition Counseling: Patient's BMI is greater than 25 and therefore counseled on weight management options. CC: Follow up chronic problems, allergies bothering her  years   days  Menstrual Status irregular Last PAP Result normal   CC:  Follow up chronic problems and allergies bothering her.  History of Present Illness: Patient reports doing well.  Denies any recent fever or chills.  Denies any appetite change or change in bowel movements. Patient denies depression, anxiety or insomnia. The pt denies polyuria, polydypsia, blurred vision. She is being evaluated by gynae for post menopausal bleeding , and she recently had a negative screening colonscopy.   Preventive Screening-Counseling & Management     Smoking Status: never  Current Medications (verified): 1)  Klor-Con M20 20 Meq  Cr-Tabs (Potassium Chloride Crys Cr) .... One Tab By Mouth Once Daily 2)  Hydrochlorothiazide 25 Mg  Tabs (Hydrochlorothiazide) .... One Tab By Mouth Once Daily  Allergies (verified): 1)  ! Penicillin  Review of Systems General:  Denies chills and fever. ENT:  Denies nasal congestion, sinus pressure, and sore throat. CV:  Denies chest pain or discomfort, palpitations, shortness of breath with exertion, and swelling of feet. Resp:  Denies cough, sputum productive, and wheezing. GI:  Denies abdominal pain, constipation, diarrhea, nausea, and vomiting; recent colonscopy, this year, is normal. GU:  See HPI; Complains of abnormal vaginal bleeding; denies dysuria and urinary frequency. MS:  Denies joint  pain and stiffness. Derm:  Denies itching, lesion(s), and rash. Neuro:  Denies headaches and seizures. Psych:  Denies anxiety and depression. Endo:  Denies cold intolerance, excessive hunger, excessive thirst, excessive urination, heat intolerance, polyuria, and weight change. Heme:  Denies bleeding and enlarge lymph nodes. Allergy:  Denies hives or rash and itching eyes.  Physical Exam  General:  alert, well-hydrated, and overweight-appearing. HEENT: No facial asymmetry,  EOMI, No sinus tenderness, TM's Clear, oropharynx  pink and moist.   Chest: Clear to auscultation bilaterally.  CVS: S1, S2, No murmurs, No S3.   Abd: Soft, Nontender.  MS: Adequate ROM spine, hips, shoulders and knees.  Ext: No edema.   CNS: CN 2-12 intact, power tone and sensation normal throughout.   Skin: Intact, no visible lesions or rashes.  Psych: Good eye contact, normal affect.  Memory intact, not anxious or depressed appearing.      Impression & Recommendations:  Problem # 1:  OVERWEIGHT (ICD-278.02) Assessment Unchanged  Ht: 68 (05/17/2008)   Wt: 173.31 (05/17/2008)   BMI: 26.45 (05/17/2008)  Problem # 2:  POSTMENOPAUSAL BLEEDING (ICD-627.1) Assessment: Comment Only currently undergoing gynae eval and treatment  Problem # 3:  HYPERLIPIDEMIA (ICD-272.4) Assessment: Improved  Labs Reviewed: SGOT: 14 (05/12/2008)   SGPT: 8 (05/12/2008)   HDL:65 (05/12/2008), 65 (01/10/2008)  LDL:77 (05/12/2008), 153 (16/10/9602)  Chol:153 (05/12/2008), 232 (01/10/2008)  Trig:54 (05/12/2008), 72 (01/10/2008), may reduce lovastatin dose  Problem # 4:  HYPERTENSION (ICD-401.9) Assessment: Unchanged  Her updated medication list for this problem includes:    Hydrochlorothiazide 25 Mg Tabs (Hydrochlorothiazide) ..... One tab by mouth once daily  BP today: 122/70 Prior BP: 126/78 (01/17/2008)  Labs Reviewed: K+: 4.1 (05/12/2008) Creat: : 0.78 (05/12/2008)   Chol: 153 (05/12/2008)   HDL: 65 (05/12/2008)   LDL:  77 (05/12/2008)   TG: 54 (05/12/2008)  Complete Medication List: 1)  Klor-con M20 20 Meq Cr-tabs (Potassium chloride crys cr) .... One tab by mouth once daily 2)  Hydrochlorothiazide 25 Mg Tabs (Hydrochlorothiazide) .... One tab by mouth once daily 3)  Flonase 50 Mcg/act Susp (Fluticasone propionate) .Marland Kitchen.. 1 puff per nostril tweice daily as needed  Patient Instructions: 1)  cPE 4 months. 2)  You can reduce the cholesterol med to 1 every Monday, wednesday , Friday and Sunday, your cholesterol, andsugar and liver are all great, keep it up. 3)  It is important that you exercise regularly at least 20 minutes 5 times a week. If you develop chest pain, have severe difficulty breathing, or feel very tired , stop exercising immediately and seek medical attention. 4)  You need to lose weight. Consider a lower calorie diet and regular exercise.  5)  Allergy med is sent to your pharmacy. Prescriptions: FLONASE 50 MCG/ACT SUSP (FLUTICASONE PROPIONATE) 1 puff per nostril tweice daily as needed  #1 x 4   Entered and Authorized by:   Margaret Simpson MD   Signed by:   Margaret Simpson MD on 05/17/2008   Method used:   Electronically to        [facility] [address] ([facility])       [address]       [place], [place]  [address]       Ph: [phone]       Fax: [phone]   RxID:   [id number]

## 2010-02-20 NOTE — Assessment & Plan Note (Signed)
Summary: EVAL/TREAT RT HIP PAIN/XR [facility] 07/17/09/REF M.SIMPSON/   Visit Type:  new patient Referring Provider:  Dr. Lodema Hong Primary Provider:  Syliva Overman MD  CC:  right hip pain.  History of Present Illness: I saw Megan Villa in the office today for an initial visit.  She is a 55 years old woman with the complaint of:  right hip pain.  Xrays right hip 07/17/09 right hip.  No recent injury.  Meds: EMR, no pain med.  This patient is actually having pain over her RIGHT hip which is actually she is pointing to her back with pain running down the RIGHT leg primarily and the L5 root distribution.  This is become severe for her it is been unrelieved by oral medications to this point.  The pain is fairly severe and been present for several weeks now.      Allergies: 1)  ! Penicillin  Past History:  Past Surgical History: Tubal ligation (1995) cyst left thumb cyst on left elbow  Family History: ONE SON LIVING MOTHER  DECEASED DIABETIC FATHER  LIVING   HYPERTENSION, DIABETES AND STROKE ONE SISTER LIVING  DIABETIC fIVE BROTHERS  LIVING  /  TWO DIABETIC, one deceased at age 66 dec 2010, unknown cause Family History of Arthritis  Social History: EMPLOYED Retail buyer Married Never Smoked Alcohol use-no Drug use-no no caffeine  Review of Systems Constitutional:  Denies weight loss, weight gain, fever, chills, and fatigue. Cardiovascular:  Denies chest pain, palpitations, fainting, and murmurs. Respiratory:  Denies short of breath, wheezing, couch, tightness, pain on inspiration, and snoring . Gastrointestinal:  Complains of heartburn; denies nausea, vomiting, diarrhea, constipation, and blood in your stools. Genitourinary:  Denies frequency, urgency, difficulty urinating, painful urination, flank pain, and bleeding in urine. Neurologic:  Denies numbness, tingling, unsteady gait, dizziness, tremors, and seizure. Musculoskeletal:  Complains of joint pain and  stiffness; denies swelling, instability, redness, heat, and muscle pain. Endocrine:  Denies excessive thirst, exessive urination, and heat or cold intolerance. Psychiatric:  Denies nervousness, depression, anxiety, and hallucinations. Skin:  Complains of rash and itching; denies changes in the skin, poor healing, and redness. HEENT:  Denies blurred or double vision, eye pain, redness, and watering. Immunology:  Complains of seasonal allergies; denies sinus problems and allergic to bee stings. Hemoatologic:  Complains of brusing; denies easy bleeding.  Physical Exam  Additional Exam:  GEN: well developed, well nourished, normal grooming and hygiene, no deformity and normal body habitus.   CDV: Lower extremities/pulses are normal, no edema, no erythema. no tenderness  Lymph:Lower extremities/ normal lymph nodes   Skin: lower extremities/no rashes, skin lesions or open sores   NEURO: lower extremities/normal coordination, reflexes, sensation. she does have a mildly positive straight leg raise on the RIGHT compared to the LEFT at about 60  Psyche: awake, alert and oriented. Mood normal   Gait: ambulating normally  Lumbar spine exam reveals tenderness in the midline primarily between L4 and 5 and L5-S1 there is slight RIGHT box tenderness as well.  Her motor exam the distally is normal in both lower extremities  all joints are stable   Impression & Recommendations:  Problem # 1:  SCIATICA (ICD-724.3) Assessment New  recommendations the pin 100 mg 3 times a day #60 with one refill and Ultracet 1-2 q.4 p.r.n. pain #84 2 refills.  They show some sciatica.  Doesn't seem to be that bad right now with intact muscle function  gaba 100 three times a day 60 1r  ultracet 1-2  q 4 84 2r   Her updated medication list for this problem includes:    Ultracet 37.5-325 Mg Tabs (Tramadol-acetaminophen) .Marland Kitchen... 1-2 q4 as needed pain  Orders: New Patient Level III (28413)  Problem # 2:  BACK PAIN  (ICD-724.5) Assessment: New  Her updated medication list for this problem includes:    Ultracet 37.5-325 Mg Tabs (Tramadol-acetaminophen) .Marland Kitchen... 1-2 q4 as needed pain  Orders: New Patient Level III (24401)  Medications Added to Medication List This Visit: 1)  Neurontin 100 Mg Caps (Gabapentin) .... Three times a day 2)  Ultracet 37.5-325 Mg Tabs (Tramadol-acetaminophen) .Marland Kitchen.. 1-2 q4 as needed pain  Patient Instructions: 1)  Start gabapentin 100 mg hs may icrease to 2 tablets at night  2)  Take ultracet for pain as needed every 4 hours  3)  recheck in 6 weeks  Prescriptions: ULTRACET 37.5-325 MG TABS (TRAMADOL-ACETAMINOPHEN) 1-2 q4 as needed pain  #84 x 2   Entered and Authorized by:   Fuller Canada MD   Signed by:   Fuller Canada MD on 10/16/2009   Method used:   Handwritten   RxID:   [id number] NEURONTIN 100 MG CAPS (GABAPENTIN) three times a day  #60 x 1   Entered and Authorized by:   Fuller Canada MD   Signed by:   Fuller Canada MD on 10/16/2009   Method used:   Handwritten   RxID:   [id number]

## 2010-02-20 NOTE — Letter (Signed)
Summary: DR.TEOH  DR.TEOH   Imported By: Lind Guest 10/01/2009 10:10:23  _____________________________________________________________________  External Attachment:    Type:   Image     Comment:   External Document

## 2010-02-20 NOTE — Assessment & Plan Note (Signed)
Summary: office visit   Vital Signs:  Patient Profile:   55 Years Old Female Height:     68 inches (172.72 cm) Weight:      174 pounds (79.09 kg) BMI:     26.55 BSA:     1.93 O2 Sat:      97 % Pulse rate:   87 / minute Resp:     16 per minute BP sitting:   126 / 78  Vitals Entered ByMarland Kitchen Everitt Amber (January 17, 2008 3:19 PM)                 Chief Complaint:  Follow up.  History of Present Illness: Ms. Martelle has been diligent with lifestyle change to improve her health with excellent results.She has lost weight , and her blood pressure and blood sugar are excellent, unfortunately her cholesterol continues to be high. She denies depression or anxiety. She has had no recent fevr or chills.    Updated Prior Medication List: KLOR-CON M20 20 MEQ  CR-TABS (POTASSIUM CHLORIDE CRYS CR) one tab by mouth once daily HYDROCHLOROTHIAZIDE 25 MG  TABS (HYDROCHLOROTHIAZIDE) one tab by mouth once daily  Current Allergies: ! PENICILLIN     Review of Systems  General      Denies chills, fatigue, fever, loss of appetite, malaise, sleep disorder, sweats, weakness, and weight loss.  ENT      Denies earache, hoarseness, nasal congestion, sinus pressure, and sore throat.  CV      Denies chest pain or discomfort, palpitations, shortness of breath with exertion, and swelling of feet.  Resp      Denies cough, sputum productive, and wheezing.  GI      Denies abdominal pain, constipation, diarrhea, indigestion, nausea, and vomiting.  GU      Complains of abnormal vaginal bleeding.      single episode of pMB in december , lasted 3 days, lMP prior to this was over 1 yeqr ago  MS      Denies joint pain and stiffness.  Derm      Denies itching, lesion(s), and rash.  Neuro      Denies falling down, headaches, poor balance, and seizures.  Psych      Denies anxiety and depression.  Endo      Denies cold intolerance, excessive hunger, excessive thirst, excessive urination, heat  intolerance, polyuria, and weight change.  Allergy      Denies hives or rash and sneezing.   Physical Exam  General:     Well-developed,well-nourished,in no acute distress; alert,appropriate and cooperative throughout examination Head:     Normocephalic and atraumatic without obvious abnormalities. No apparent alopecia or balding. Eyes:     vision grossly intact.   Ears:     External ear exam shows no significant lesions or deformities.  Otoscopic examination reveals clear canals, tympanic membranes are intact bilaterally without bulging, retraction, inflammation or discharge. Hearing is grossly normal bilaterally. Nose:     no external deformity and no septum abnormalities.   Mouth:     Oral mucosa and oropharynx without lesions or exudates.  Teeth in good repair. Neck:     No deformities, masses, or tenderness noted. Lungs:     Normal respiratory effort, chest expands symmetrically. Lungs are clear to auscultation, no crackles or wheezes. Heart:     Normal rate and regular rhythm. S1 and S2 normal without gallop, murmur, click, rub or other extra sounds. Abdomen:     soft and non-tender.   Extremities:  No clubbing, cyanosis, edema, or deformity noted with normal full range of motion of all joints.   Neurologic:     alert & oriented X3, cranial nerves II-XII intact, strength normal in all extremities, sensation intact to light touch, and gait normal.   Skin:     Intact without suspicious lesions or rashes Cervical Nodes:     No lymphadenopathy noted Psych:     Cognition and judgment appear intact. Alert and cooperative with normal attention span and concentration. No apparent delusions, illusions, hallucinations    Impression & Recommendations:  Problem # 1:  POSTMENOPAUSAL BLEEDING (ICD-627.1) Assessment: Comment Only  Orders: Gynecologic Referral (Gyn)   Problem # 2:  IMPAIRED GLUCOSE TOLERANCE (ICD-271.3) Assessment: Improved  Orders: Hemoglobin A1C  (83036)  6.0 improved   Problem # 3:  HYPERLIPIDEMIA (ICD-272.4) Assessment: Unchanged  The following medications were removed from the medication list:    Lovastatin 40 Mg Tabs (Lovastatin) .Marland Kitchen... Take 1 tab by mouth at bedtime, pt was not taking meds but intends to do so  Orders: T-Hepatic Function [phone]) T-Lipid Profile [id number])  Labs Reviewed: Chol: 232 (01/10/2008)   HDL: 65 (01/10/2008)   LDL: 153 (01/10/2008)   TG: 72 (01/10/2008) SGOT: 16 (01/10/2008)   SGPT: 9 (01/10/2008)   Problem # 4:  HYPERTENSION (ICD-401.9) Assessment: Unchanged  Her updated medication list for this problem includes:    Hydrochlorothiazide 25 Mg Tabs (Hydrochlorothiazide) ..... One tab by mouth once daily  Orders: T-Basic Metabolic Panel [id number])  BP today: 126/78 Prior BP: 118/80 (10/17/2007)  Labs Reviewed: Creat: 0.72 (07/02/2007) Chol: 232 (01/10/2008)   HDL: 65 (01/10/2008)   LDL: 153 (01/10/2008)   TG: 72 (01/10/2008)   Problem # 5:  OBESITY (ICD-278.00) Assessment: Improved  Complete Medication List: 1)  Klor-con M20 20 Meq Cr-tabs (Potassium chloride crys cr) .... One tab by mouth once daily 2)  Hydrochlorothiazide 25 Mg Tabs (Hydrochlorothiazide) .... One tab by mouth once daily  Other Orders: Gastroenterology Referral (GI)   Patient Instructions: 1)  Please schedule a follow-up appointment in 4 months. 2)  It is important that you exercise regularly at least 20 minutes 5 times a week. If you develop chest pain, have severe difficulty breathing, or feel very tired , stop exercising immediately and seek medical attention. 3)  You need to lose weight. Consider a lower calorie diet and regular exercise.  4)  Your bp is great and so is your blood sugar, continue exercisre and weight loss and your meds. 5)  Your cholesterol has not changed, you nEED to take th emeds as prescribed... lovastatin 40mg  1 at night. 6)  You will be referred to a gynaecologist about the  menstrual bleeding. 7)  BMP prior to visit, ICD-9: 8)  Hepatic Panel prior to visit, ICD-9:  in four months 9)  Lipid Panel prior to visit, ICD-9:   Prescriptions: HYDROCHLOROTHIAZIDE 25 MG  TABS (HYDROCHLOROTHIAZIDE) one tab by mouth once daily  #30 x 5   Entered by:   Worthy Keeler LPN   Authorized by:   Syliva Overman MD   Signed by:   Worthy Keeler LPN on 47/42/5956   Method used:   Electronically to        [facility]  [address] (retail)       [address]       [place], [place]  [address]       Ph: [phone]  Fax: [phone]   RxID:   [id number] KLOR-CON M20 20 MEQ  CR-TABS (POTASSIUM CHLORIDE CRYS CR) one tab by mouth once daily  #30 x 5   Entered by:   Worthy Keeler LPN   Authorized by:   Syliva Overman MD   Signed by:   Worthy Keeler LPN on 30/86/5784   Method used:   Electronically to        [facility]  [address] (retail)       [address]       [place], [place]  [address]       Ph: [phone]       Fax: [phone]   RxID:   [id number]  ]  Laboratory Results   Blood Tests   Date/Time Received: January 17, 2008 4pm Date/Time Reported: January 17, 2008 4pm  HGBA1C: 6.0%   (Normal Range: Non-Diabetic - 3-6%   Control Diabetic - 6-8%)

## 2010-02-20 NOTE — Assessment & Plan Note (Signed)
Summary: physical   Vital Signs:  Patient profile:   55 year old female Menstrual status:  irregular Height:      68 inches Weight:      192 pounds BMI:     29.30 O2 Sat:      95 % Pulse rate:   91 / minute Pulse rhythm:   regular Resp:     16 per minute BP sitting:   118 / 80  (left arm) Cuff size:   large  Vitals Entered By: Syliva Overman MD (July 02, 2009 2:30 PM)  Nutrition Counseling: Patient's BMI is greater than 25 and therefore counseled on weight management options. CC: CPE, thinks she pulled a mucsle in right hip, shoots pain down her right leg  Vision Screening:Left eye w/o correction: 20 / 20 Right Eye w/o correction: 20 / 20 Both eyes w/o correction:  20/ 20  Color vision testing: normal      Vision Entered By: Syliva Overman MD (July 02, 2009 2:36 PM)  1  Primary Care Provider:  Syliva Overman MD  CC:  CPE, thinks she pulled a mucsle in right hip, and shoots pain down her right leg.  History of Present Illness: Reports  that they are doing well. Denies recent fever or chills. Denies sinus pressure, nasal congestion , ear pain or sore throat. Denies chest congestion, or cough productive of sputum. Denies chest pain, palpitations, PND, orthopnea or leg swelling. Denies abdominal pain, nausea, vomitting, diarrhea or constipation. Denies change in bowel movements or bloody stool. Denies dysuria , frequency, incontinence or hesitancy. . Denies headaches, vertigo, seizures. Denies depression, anxiety or insomnia. Denies  rash, lesions, or itch. Megan Villa reports inc stress and irritability since her spouse has been out of work. She has been less diligent with her diet and does not exercise like she should, she has gained weight ,but plans to reverse this. She denies polyuria, p[olydypsia or  blurred vision      Current Medications (verified): 1)  Klor-Con M20 20 Meq  Cr-Tabs (Potassium Chloride Crys Cr) .... One Tab By Mouth Once Daily 2)   Hydrochlorothiazide 25 Mg  Tabs (Hydrochlorothiazide) .... One Tab By Mouth Once Daily 3)  Flonase 50 Mcg/act Susp (Fluticasone Propionate) .Marland Kitchen.. 1 Puff Per Nostril Tweice Daily As Needed 4)  Lovastatin 40 Mg Tabs (Lovastatin) .... Take 1 Tablet By Mouth Once A Day  Allergies (verified): 1)  ! Penicillin  Review of Systems      See HPI General:  Complains of fatigue. Eyes:  Denies blurring and discharge. Megan:  Complains of low back pain; right hip pain radiating down posterior right thigh to knee ever since pushing something with the hip on the job for 3 weeks, 3 weeks ago she also injured her right knee on the job , she fell, but the pain has resolved, there was some bruising but thisresolved. Psych:  Complains of anxiety and depression; denies suicidal thoughts/plans, thoughts of violence, and unusual visions or sounds; inc stresss due to spouse not working for 2 yrs, also her job is stressful. Endo:  Denies cold intolerance, excessive hunger, excessive thirst, excessive urination, heat intolerance, polyuria, and weight change. Heme:  Denies abnormal bruising and bleeding. Allergy:  Denies hives or rash and itching eyes.  Physical Exam  General:  Well-developed,well-nourished,in no acute distress; alert,appropriate and cooperative throughout examination Head:  Normocephalic and atraumatic without obvious abnormalities. No apparent alopecia or balding. Eyes:  No corneal or conjunctival inflammation noted. EOMI. Perrla. Funduscopic exam  benign, without hemorrhages, exudates or papilledema. Vision grossly normal. Ears:  External ear exam shows no significant lesions or deformities.  Otoscopic examination revealsoccluded  canals, tympanic membranes are intact bilaterally without bulging, retraction, inflammation or discharge. Hearing is grossly normal bilaterally. Nose:  External nasal examination shows no deformity or inflammation. Nasal mucosa are pink and moist without lesions or  exudates. Mouth:  Oral mucosa and oropharynx without lesions or exudates.  Teeth in good repair. Neck:  No deformities, masses, or tenderness noted. Chest Wall:  No deformities, masses, or tenderness noted. Breasts:  No mass, nodules, thickening, tenderness, bulging, retraction, inflamation, nipple discharge or skin changes noted.   Lungs:  Normal respiratory effort, chest expands symmetrically. Lungs are clear to auscultation, no crackles or wheezes. Heart:  Normal rate and regular rhythm. S1 and S2 normal without gallop, murmur, click, rub or other extra sounds. Abdomen:  Bowel sounds positive,abdomen soft and non-tender without masses, organomegaly or hernias noted. Rectal:  No external abnormalities noted. Normal sphincter tone. No rectal masses or tenderness.Guaic neg stool Genitalia:  Normal introitus for age, no external lesions, no vaginal discharge, mucosa pink and moist, no vaginal or cervical lesions, no vaginal atrophy, no friaility or hemorrhage, normal uterus size and position, no adnexal masses or tenderness Msk:  No deformity or scoliosis noted of thoracic or lumbar spine.   Pulses:  R and L carotid,radial,femoral,dorsalis pedis and posterior tibial pulses are full and equal bilaterally Extremities:  No clubbing, cyanosis, edema, or deformity noted with normal full range of motion of all joints.   Neurologic:  No cranial nerve deficits noted. Station and gait are normal. Plantar reflexes are down-going bilaterally. DTRs are symmetrical throughout. Sensory, motor and coordinative functions appear intact. Skin:  Intact without suspicious lesions or rashes Cervical Nodes:  No lymphadenopathy noted Axillary Nodes:  No palpable lymphadenopathy Inguinal Nodes:  No significant adenopathy Psych:  Cognition and judgment appear intact. Alert and cooperative with normal attention span and concentration. No apparent delusions, illusions, hallucinations   Impression &  Recommendations:  Problem # 1:  SPECIAL SCREENING FOR MALIGNANT NEOPLASMS COLON (ICD-V76.51) Assessment Comment Only  Orders: Hemoccult Guaiac-1 spec.(in office) (82270)  Problem # 2:  SCREENING FOR MALIGNANT NEOPLASM OF THE CERVIX (ICD-V76.2) Assessment: Comment Only  Orders: Pap Smear (08657)  Problem # 3:  CERUMEN IMPACTION, BILATERAL (ICD-380.4) Assessment: Comment Only  Orders: ENT Referral (ENT)  Problem # 4:  THIGH PAIN (ICD-729.5) Assessment: Comment Only prednisone dose pack and ibuprofen prescribed  Problem # 5:  IMPAIRED GLUCOSE TOLERANCE (ICD-271.3) Assessment: Comment Only importance of a low carb diet with exercise and weight loss stressed  Problem # 6:  HYPERTENSION (ICD-401.9) Assessment: Unchanged  Her updated medication list for this problem includes:    Hydrochlorothiazide 25 Mg Tabs (Hydrochlorothiazide) ..... One tab by mouth once daily  Orders: T-Basic Metabolic Panel [id number])  BP today: 118/80 Prior BP: 120/80 (01/15/2009)  Labs Reviewed: K+: 3.9 (01/16/2009) Creat: : 0.70 (01/16/2009)   Chol: 174 (01/16/2009)   HDL: 64 (01/16/2009)   LDL: 102 (01/16/2009)   TG: 41 (01/16/2009)  Problem # 7:  HYPERLIPIDEMIA (ICD-272.4) Assessment: Comment Only  Her updated medication list for this problem includes:    Lovastatin 40 Mg Tabs (Lovastatin) .Marland Kitchen... Take 1 tablet by mouth once a day  Orders: T-Hepatic Function [id number]) T-Lipid Profile [id number])  Labs Reviewed: SGOT: 16 (01/16/2009)   SGPT: 11 (01/16/2009)   HDL:64 (01/16/2009), 65 (05/12/2008)  LDL:102 (01/16/2009), 77 (47/42/5956)  Chol:174 (01/16/2009), 153 (05/12/2008)  Trig:41 (  01/16/2009), 54 (05/12/2008)  Complete Medication List: 1)  Klor-con M20 20 Meq Cr-tabs (Potassium chloride crys cr) .... One tab by mouth once daily 2)  Hydrochlorothiazide 25 Mg Tabs (Hydrochlorothiazide) .... One tab by mouth once daily 3)  Flonase 50 Mcg/act Susp (Fluticasone propionate) .Marland Kitchen.. 1  puff per nostril tweice daily as needed 4)  Lovastatin 40 Mg Tabs (Lovastatin) .... Take 1 tablet by mouth once a day 5)  Ibuprofen 800 Mg Tabs (Ibuprofen) .... Take 1 tablet by mouth three times a day , start  today 6)  Prednisone (pak) 5 Mg Tabs (Prednisone) .... Use as directed  Other Orders: T- Hemoglobin A1C ([id number]) T- Hemoglobin A1C ([id number])  Patient Instructions: 1)  Please schedule a follow-up appointment in 3 months. 2)  It is important that you exercise regularly at least 20 minutes 5 times a week. If you develop chest pain, have severe difficulty breathing, or feel very tired , stop exercising immediately and seek medical attention. 3)  You need to lose weight. Consider a lower calorie diet and regular exercise.  4)  BMP prior to visit, ICD-9: 5)  Hepatic Panel prior to visit, ICD-9: 6)  Lipid Panel prior to visit, ICD-9:   fasting asap 7)  HbgA1C prior to visit, ICD-9: 8)  you will be treated for sciatica, meds will be sent in 9)  hBA1c in 3 months 10)  you will be referred for ear irrigation Prescriptions: LOVASTATIN 40 MG TABS (LOVASTATIN) Take 1 tablet by mouth once a day  #90 x 4   Entered by:   Everitt Amber LPN   Authorized by:   Syliva Overman MD   Signed by:   Everitt Amber LPN on 30/86/5784   Method used:   Faxed to ...       [facility] (mail-order)             , [place]         Ph: [phone]       Fax: [phone]   RxID:   [id number] KLOR-CON M20 20 MEQ  CR-TABS (POTASSIUM CHLORIDE CRYS CR) one tab by mouth once daily  #90 x 4   Entered by:   Everitt Amber LPN   Authorized by:   Syliva Overman MD   Signed by:   Everitt Amber LPN on 38/75/6433   Method used:   Faxed to ...       [facility] (mail-order)             , [place]         Ph: [phone]       Fax: [phone]   RxID:   [id number] HYDROCHLOROTHIAZIDE 25 MG  TABS (HYDROCHLOROTHIAZIDE) one tab by mouth once daily  #90 x 4   Entered by:   Everitt Amber LPN   Authorized by:   Syliva Overman  MD   Signed by:   Everitt Amber LPN on 62/37/6283   Method used:   Faxed to ...       [facility] (mail-order)             , [place]         Ph: [phone]       Fax: [phone]   RxID:   [id number] PREDNISONE (PAK) 5 MG TABS (PREDNISONE) Use as directed  #21 x 0   Entered and Authorized by:   Syliva Overman MD   Signed by:   Syliva Overman MD on 07/02/2009   Method used:   Electronically to  [facility]  [address] (retail)       [address]       [place], [place]  [address]       Ph: [phone]       Fax: [phone]   RxID:   [id number] IBUPROFEN 800 MG TABS (IBUPROFEN) Take 1 tablet by mouth three times a day , start  today  #30 x 0   Entered and Authorized by:   Syliva Overman MD   Signed by:   Syliva Overman MD on 07/02/2009   Method used:   Electronically to        [facility]  [address] (retail)       [address]       [place], [place]  [address]       Ph: [phone]       Fax: [phone]   RxID:   [id number]   Laboratory Results    Stool - Occult Blood Hemmoccult #1: negative Date: 07/02/2009 Comments: [id number] 9r 8/11 118 1012

## 2010-02-20 NOTE — Medication Information (Signed)
Summary: Tax adviser   Imported By: Cammie Sickle 10/17/2009 08:29:20  _____________________________________________________________________  External Attachment:    Type:   Image     Comment:   External Document

## 2010-02-20 NOTE — Progress Notes (Signed)
Summary: BP MEDS  Phone Note Call from Patient   Summary of Call: NEEDS BP FILLED DRUG STORE TOLD HER TO CALL HERE [facility] ALSO NEEDS FILLING CELL [phone] Initial call taken by: Lind Guest,  December 20, 2007 10:53 AM  Follow-up for Phone Call        Rxs filled      Prescriptions: KLOR-CON M20 20 MEQ  CR-TABS (POTASSIUM CHLORIDE CRYS CR) one tab by mouth once daily  #30 x 5   Entered by:   Everitt Amber   Authorized by:   Syliva Overman MD   Signed by:   Everitt Amber on 12/20/2007   Method used:   Electronically to        [facility]  [address] (retail)       [address]       [place], [place]  [address]       Ph: [phone]       Fax: [phone]   RxID:   [id number] HYDROCHLOROTHIAZIDE 25 MG  TABS (HYDROCHLOROTHIAZIDE) one tab by mouth once daily  #30 x 5   Entered by:   Everitt Amber   Authorized by:   Syliva Overman MD   Signed by:   Everitt Amber on 12/20/2007   Method used:   Electronically to        [facility]   [address] ([facility]ail)       [address]       [place], [place]  [address]       Ph: [phone]       Fax: [phone]   RxID:   [id number]

## 2010-02-20 NOTE — Progress Notes (Signed)
Summary: REFILL  Phone Note Call from Patient   Reason for Call: Lab or Test Results Summary of Call: NEEDS REFILL ON HER POT. AT [facility] [place] Initial call taken by: Lind Guest,  January 01, 2009 10:32 AM  Follow-up for Phone Call        Rx Called In Follow-up by: Worthy Keeler LPN,  January 01, 2009 1:46 PM    Prescriptions: KLOR-CON M20 20 MEQ  CR-TABS (POTASSIUM CHLORIDE CRYS CR) one tab by mouth once daily  #30 Each x 1   Entered by:   Worthy Keeler LPN   Authorized by:   Syliva Overman MD   Signed by:   Worthy Keeler LPN on 16/10/9602   Method used:   Electronically to        [facility]  [address] (retail)       [address]       [place], [place]  [address]       Ph: [phone]       Fax: [phone]   RxID:   [id number]

## 2010-02-20 NOTE — Progress Notes (Signed)
Summary: refill  Phone Note Call from Patient   Summary of Call: needs her pot. med .[facility] [place] Initial call taken by: Lind Guest,  March 05, 2009 8:14 AM  Follow-up for Phone Call        Rx Called In Follow-up by: Lilyan Gilford LPN,  March 05, 2009 8:36 AM    Prescriptions: KLOR-CON M20 20 MEQ  CR-TABS (POTASSIUM CHLORIDE CRYS CR) one tab by mouth once daily  #90 x 0   Entered by:   Lilyan Gilford LPN   Authorized by:   Syliva Overman MD   Signed by:   Lilyan Gilford LPN on 16/10/9602   Method used:   Electronically to        [facility]  [address] (retail)       [address]       [place], [place]  [address]       Ph: [phone]       Fax: [phone]   RxID:   [id number]

## 2010-02-20 NOTE — Assessment & Plan Note (Signed)
Summary: office visit   Vital Signs:  Patient profile:   55 year old female Menstrual status:  irregular Height:      68 inches Weight:      193 pounds BMI:     29.45 O2 Sat:      97 % on Room air Pulse rate:   83 / minute Pulse rhythm:   regular Resp:     16 per minute BP sitting:   116 / 68  (left arm)  Vitals Entered By: Mauricia Area CMA (November 20, 2009 3:29 PM)  Nutrition Counseling: Patient's BMI is greater than 25 and therefore counseled on weight management options.  O2 Flow:  Room air CC: follow up   Primary Care Provider:  Syliva Overman MD  CC:  follow up.  History of Present Illness: Reports  that she has been  doing well. She states that she really has not been exercising as she should but intends to work on this. Denies recent fever or chills. Denies sinus pressure, nasal congestion , ear pain or sore throat. Denies chest congestion, or cough productive of sputum. Denies chest pain, palpitations, PND, orthopnea or leg swelling. Denies abdominal pain, nausea, vomitting, diarrhea or constipation. Denies change in bowel movements or bloody stool. Denies dysuria , frequency, incontinence or hesitancy. . Denies headaches, vertigo, seizures. Denies depression, anxiety or insomnia. Denies  rash, lesions, or itch.     Current Medications (verified): 1)  Klor-Con M20 20 Meq  Cr-Tabs (Potassium Chloride Crys Cr) .... One Tab By Mouth Once Daily 2)  Hydrochlorothiazide 25 Mg  Tabs (Hydrochlorothiazide) .... One Tab By Mouth Once Daily 3)  Flonase 50 Mcg/act Susp (Fluticasone Propionate) .Marland Kitchen.. 1 Puff Per Nostril Tweice Daily As Needed 4)  Lovastatin 40 Mg Tabs (Lovastatin) .... Take 1 Tab By Mouth At Bedtime  Allergies (verified): 1)  ! Penicillin  Review of Systems      See HPI General:  Complains of fatigue. Eyes:  Denies blurring and discharge. MS:  Complains of low back pain and mid back pain; evaluated by ortho and dx with sciatica. Endo:  Denies  cold intolerance, excessive hunger, excessive thirst, excessive urination, and heat intolerance. Heme:  Denies abnormal bruising and bleeding. Allergy:  Denies hives or rash and itching eyes.  Physical Exam  General:  Well-developedoverweight,in no acute distress; alert,appropriate and cooperative throughout examination HEENT: No facial asymmetry,  EOMI, No sinus tenderness, TM's Clear, oropharynx  pink and moist.   Chest: Clear to auscultation bilaterally.  CVS: S1, S2, No murmurs, No S3.   Abd: Soft, Nontender.  MS: Adequate ROM spine, hips, shoulders and knees.  Ext: No edema.   CNS: CN 2-12 intact, power tone and sensation normal throughout.   Skin: Intact, no visible lesions or rashes.  Psych: Good eye contact, normal affect.  Memory intact, not anxious or depressed appearing.    Impression & Recommendations:  Problem # 1:  OVERWEIGHT (ICD-278.02) Assessment Deteriorated  Ht: 68 (11/20/2009)   Wt: 193 (11/20/2009)   BMI: 29.45 (11/20/2009) therapeutic lifestyle change discussed and encouraged  Problem # 2:  HYPERTENSION (ICD-401.9) Assessment: Improved  Her updated medication list for this problem includes:    Hydrochlorothiazide 25 Mg Tabs (Hydrochlorothiazide) ..... One tab by mouth once daily therapeutic lifestyle change discussed and encouraged  Orders: T-Basic Metabolic Panel ([id number])  BP today: 116/68 Prior BP: 130/70 (09/12/2009)  Labs Reviewed: K+: 4.1 (07/03/2009) Creat: : 0.75 (07/03/2009)   Chol: 216 (11/16/2009)   HDL: 67 (11/16/2009)  LDL: 138 (11/16/2009)   TG: 57 (11/16/2009)  Problem # 3:  HYPERLIPIDEMIA (ICD-272.4) Assessment: Deteriorated  Her updated medication list for this problem includes:    Lovastatin 40 Mg Tabs (Lovastatin) .Marland Kitchen... Take 1 tab by mouth at bedtime Low fat dietdiscussed and encouraged  Orders: T-Lipid Profile ([id number])  Labs Reviewed: SGOT: 22 (11/16/2009)   SGPT: 15 (11/16/2009)   HDL:67 (11/16/2009), 62  (65/78/4696)  LDL:138 (11/16/2009), 110 (07/03/2009)  Chol:216 (11/16/2009), 186 (07/03/2009)  Trig:57 (11/16/2009), 71 (07/03/2009)  Problem # 4:  IMPAIRED GLUCOSE TOLERANCE (ICD-271.3) Assessment: Improved HBA1C diown from 6.3 to 6.1  Complete Medication List: 1)  Klor-con M20 20 Meq Cr-tabs (Potassium chloride crys cr) .... One tab by mouth once daily 2)  Hydrochlorothiazide 25 Mg Tabs (Hydrochlorothiazide) .... One tab by mouth once daily 3)  Flonase 50 Mcg/act Susp (Fluticasone propionate) .Marland Kitchen.. 1 puff per nostril tweice daily as needed 4)  Lovastatin 40 Mg Tabs (Lovastatin) .... Take 1 tab by mouth at bedtime 5)  Clotrimazole-betamethasone 1-0.05 % Crea (Clotrimazole-betamethasone) .... Apply twice daily to affected area for 10 days , then as needed  Other Orders: T-CBC w/Diff ([id number]) T-TSH [phone]) T- Hemoglobin A1C ([id number]) Radiology Referral (Radiology)  Patient Instructions: 1)  Please schedule a follow-up appointment in 4 months. 2)  It is important that you exercise regularly at least 20 minutes 5 times a week. If you develop chest pain, have severe difficulty breathing, or feel very tired , stop exercising immediately and seek medical attention. 3)  You need to lose weight. Consider a lower calorie diet and regular exercise.  4)  BMP prior to visit, ICD-9: 5)  Lipid Panel prior to visit, ICD-9: 6)  TSH prior to visit, ICD-9:b   fasting in 4 mopnths. 7)  CBC w/ Diff prior to visit, ICD-9: 8)  HbgA1C prior to visit, ICD-9: and vit D  9)  your cholesterol is too high, pls increase the lovastatin to every night again Prescriptions: LOVASTATIN 40 MG TABS (LOVASTATIN) Take 1 tab by mouth at bedtime  #90 x 1   Entered by:   Adella Hare LPN   Authorized by:   Syliva Overman MD   Signed by:   Adella Hare LPN on 95/63/8756   Method used:   Electronically to        [facility]  [address] (retail)       [address]       [place], [place]   [address]       Ph: [phone]       Fax: [phone]   RxID:   [id number] CLOTRIMAZOLE-BETAMETHASONE 1-0.05 % CREA (CLOTRIMAZOLE-BETAMETHASONE) apply twice daily to affected area for 10 days , then as needed  #45gm x 1   Entered and Authorized by:   Syliva Overman MD   Signed by:   Syliva Overman MD on 11/20/2009   Method used:   Electronically to        [facility]  [address] (retail)       [address]       [place], [place]  [address]       Ph: [phone]       Fax: [phone]   RxID:   [id number]    Orders Added: 1)  Est. Patient Level IV [[address]] 2)  T-Basic Metabolic Panel [[id number]] 3)  T-Lipid Profile [80061-22930] 4)  T-CBC w/Diff [17510-25852] 5)  T-TSH [  16109-60454] 6)  T- Hemoglobin A1C [83036-23375] 7)  Radiology Referral [Radiology]

## 2010-02-20 NOTE — Progress Notes (Signed)
  Phone Note Call from Patient   Caller: Patient Summary of Call: patient wants to know what she can do about the arthritis Initial call taken by: Adella Hare LPN,  July 18, 2009 11:28 AM  Follow-up for Phone Call        regular walking, weight loss, and if the pain is too bd use one tylenol at most 4 times a week, if the prob worsens I will refer her to ortho pls lether know Follow-up by: Syliva Overman MD,  July 18, 2009 2:47 PM  Additional Follow-up for Phone Call Additional follow up Details #1::        patient aware  wants referal to orth. dr. 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 lower ext pain, pls fax over the xrays also Follow-up by: Syliva Overman MD,  July 19, 2009 11:11 AM  Additional Follow-up for Phone Call Additional follow up Details #3:: Details for Additional Follow-up Action Taken: ok for the ibup used till she sees him Additional Follow-up by: Syliva Overman MD,  July 19, 2009 12:04 PM  Prescriptions: IBUPROFEN 800 MG TABS (IBUPROFEN) Take 1 tablet by mouth three times a day , start  today  #30 x 0   Entered by:   Adella Hare LPN   Authorized by:   Syliva Overman MD   Signed by:   Adella Hare LPN on 96/04/5407   Method used:   Electronically to        [facility]  [address] (retail)       [address]       [place], [place]  [address]       Ph: [phone]       Fax: [phone]   RxID:   [id number]

## 2010-02-20 NOTE — Progress Notes (Signed)
Summary: samples  Phone Note Call from Patient   Summary of Call: pt needs a sample of nasal spray   [phone] Initial call taken by: Rudene Anda,  April 25, 2008 3:46 PM  Follow-up for Phone Call        Phone Call Completed Follow-up by: Worthy Keeler LPN,  April 25, 2008 3:55 PM

## 2010-02-20 NOTE — Miscellaneous (Signed)
Summary: [facility] Previsit/Prep  Clinical Lists Changes  Observations: Added new observation of ALLERGY REV: Done (02/13/2008 17:03)

## 2010-02-20 NOTE — Letter (Signed)
Summary: History form  History form   Imported By: Jacklynn Ganong 10/15/2009 12:17:59  _____________________________________________________________________  External Attachment:    Type:   Image     Comment:   External Document

## 2010-02-20 NOTE — Progress Notes (Signed)
Summary: results on lab  Phone Note Call from Patient   Summary of Call: pt would like to get results of lab [phone] Initial call taken by: Rudene Anda,  July 05, 2009 10:03 AM  Follow-up for Phone Call        Patient aware per append in note Follow-up by: Everitt Amber LPN,  July 05, 2009 3:49 PM

## 2010-02-20 NOTE — Progress Notes (Signed)
Summary: rx  Phone Note Call from Patient   Summary of Call: pt needs her bp meds and chol meds faxed into pharm. [facility] [phone] cell [phone] Initial call taken by: Rudene Anda,  September 21, 2008 8:26 AM  Follow-up for Phone Call        Rx Called In, left detailed message Follow-up by: Worthy Keeler LPN,  September 21, 2008 8:31 AM    Prescriptions: LOVASTATIN 40 MG TABS (LOVASTATIN) Take 1 tablet by mouth once a day  #30 x 5   Entered by:   Worthy Keeler LPN   Authorized by:   Syliva Overman MD   Signed by:   Worthy Keeler LPN on 69/62/9528   Method used:   Electronically to        [facility]  [address] [facility] (retail)       [address]       [place], [place]  [address]       Ph: [phone]       Fax: [phone]   RxID:   [id number] HYDROCHLOROTHIAZIDE 25 MG  TABS (HYDROCHLOROTHIAZIDE) one tab by mouth once daily  #30 x 5   Entered by:   Worthy Keeler LPN   Authorized by:   Syliva Overman MD   Signed by:   Worthy Keeler LPN on 84/16/6063   Method used:   Electronically to        [facility]  [address] (retail)       [address]       [place], [place]  [address]       Ph: [phone]       Fax: [phone]   RxID:   [id number]

## 2010-02-20 NOTE — Assessment & Plan Note (Signed)
Summary: RASH   Vital Signs:  Patient profile:   55 year old female Menstrual status:  irregular Height:      68 inches Weight:      191.75 pounds BMI:     29.26 O2 Sat:      98 % on Room air Pulse rate:   80 / minute Pulse rhythm:   regular Resp:     16 per minute BP sitting:   130 / 70  (left arm)  Vitals Entered By: Adella Hare LPN (September 12, 2009 3:35 PM)  Nutrition Counseling: Patient's BMI is greater than 25 and therefore counseled on weight management options.  O2 Flow:  Room air CC: rash on neck Is Patient Diabetic? No Pain Assessment Patient in pain? no      Comments did not bring meds to ov   Primary Care Provider:  Syliva Overman MD  CC:  rash on neck.  History of Present Illness: Reports  that she has been oing well. Denies recent fever or chills. Denies sinus pressure, nasal congestion , ear pain or sore throat. Denies chest congestion, or cough productive of sputum. Denies chest pain, palpitations, PND, orthopnea or leg swelling. Denies abdominal pain, nausea, vomitting, diarrhea or constipation. Denies change in bowel movements or bloody stool. Denies dysuria , frequency, incontinence or hesitancy.  Denies headaches, vertigo, seizures. Denies depression, anxiety or insomnia.    Allergies (verified): 1)  ! Penicillin  Review of Systems      See HPI General:  Complains of fatigue. Eyes:  Denies blurring and discharge. MS:  Complains of joint pain and stiffness; persistent right hip pain and stiffness. Derm:  Complains of itching and rash; puritic rash on left post neck x 1 week, has used calamine lotion, benadry cream, neosporin getting worse, no fever , chills or drainage. Endo:  Denies cold intolerance, excessive thirst, excessive urination, and weight change. Heme:  Denies abnormal bruising and bleeding. Allergy:  Complains of seasonal allergies.  Physical Exam  General:  Well-developedoverweight,in no acute distress;  alert,appropriate and cooperative throughout examination HEENT: No facial asymmetry,  EOMI, No sinus tenderness, TM's Clear, oropharynx  pink and moist.   Chest: Clear to auscultation bilaterally.  CVS: S1, S2, No murmurs, No S3.   Abd: Soft, Nontender.  MS: Adequate ROM spine,  shoulders and knees. Reduced in right hip. Ext: No edema.   CNS: CN 2-12 intact, power tone and sensation normal throughout.   Skin: Intact,maculopapular rash on neck along with fungal infectiuon Psych: Good eye contact, normal affect.  Memory intact, not anxious or depressed appearing.    Impression & Recommendations:  Problem # 1:  DERMATITIS (ICD-692.9) Assessment Deteriorated  The following medications were removed from the medication list:    Prednisone (pak) 5 Mg Tabs (Prednisone) ..... Use as directed  Orders: Depo- Medrol 80mg  (J1040) Admin of Therapeutic Inj  intramuscular or subcutaneous ([address])  Problem # 2:  HIP PAIN, RIGHT (ICD-719.45) Assessment: Unchanged  The following medications were removed from the medication list:    Ibuprofen 800 Mg Tabs (Ibuprofen) .Marland Kitchen... Take 1 tablet by mouth three times a day , start  today    Cyclobenzaprine Hcl 10 Mg Tabs (Cyclobenzaprine hcl) .Marland Kitchen... Take 1 tab by mouth at bedtime as needed  Orders: Radiology Referral (Radiology) Orthopedic Surgeon Referral (Ortho Surgeon)  Problem # 3:  IMPAIRED FASTING GLUCOSE (ICD-790.21) Assessment: Deteriorated  Labs Reviewed: Creat: 0.75 (07/03/2009)    hBA1C  6.3 in June, reduced caRB INTAKE WITH EXERCISE ADVISED  Problem # 4:  HYPERTENSION (ICD-401.9) Assessment: Unchanged  Her updated medication list for this problem includes:    Hydrochlorothiazide 25 Mg Tabs (Hydrochlorothiazide) ..... One tab by mouth once daily  BP today: 130/70 Prior BP: 118/80 (07/02/2009)  Labs Reviewed: K+: 4.1 (07/03/2009) Creat: : 0.75 (07/03/2009)   Chol: 186 (07/03/2009)   HDL: 62 (07/03/2009)   LDL: 110 (07/03/2009)   TG:  71 (07/03/2009)  Problem # 5:  HYPERLIPIDEMIA (ICD-272.4) Assessment: Deteriorated  Her updated medication list for this problem includes:    Lovastatin 40 Mg Tabs (Lovastatin) .Marland Kitchen... Take 1 tablet by mouth once a day  Labs Reviewed: SGOT: 20 (07/03/2009)   SGPT: 15 (07/03/2009)   HDL:62 (07/03/2009), 64 (01/16/2009)  LDL:110 (07/03/2009), 102 (01/16/2009)  Chol:186 (07/03/2009), 174 (01/16/2009)  Trig:71 (07/03/2009), 41 (01/16/2009)  Problem # 6:  DERMATOMYCOSIS (ICD-111.9) Assessment: Deteriorated  Her updated medication list for this problem includes:    Terbinafine Hcl 250 Mg Tabs (Terbinafine hcl) .Marland Kitchen... Take 1 tablet by mouth once a day    Terbinafine Hcl 1 % Crea (Terbinafine hcl) .Marland Kitchen... Apply twice daily for 10 days, then as needed  Complete Medication List: 1)  Klor-con M20 20 Meq Cr-tabs (Potassium chloride crys cr) .... One tab by mouth once daily 2)  Hydrochlorothiazide 25 Mg Tabs (Hydrochlorothiazide) .... One tab by mouth once daily 3)  Flonase 50 Mcg/act Susp (Fluticasone propionate) .Marland Kitchen.. 1 puff per nostril tweice daily as needed 4)  Lovastatin 40 Mg Tabs (Lovastatin) .... Take 1 tablet by mouth once a day 5)  Terbinafine Hcl 250 Mg Tabs (Terbinafine hcl) .... Take 1 tablet by mouth once a day 6)  Hydroxyzine Hcl 25 Mg Tabs (Hydroxyzine hcl) .... Take 1 tab by mouth at bedtime as needed 7)  Terbinafine Hcl 1 % Crea (Terbinafine hcl) .... Apply twice daily for 10 days, then as needed  Other Orders: T-Hepatic Function [phone]) T-Lipid Profile [phone]) T- Hemoglobin A1C ([id number])  Patient Instructions: 1)  F/u  early october, pls cancel  Sept 2)  You are being treated for dermatitis and fungal; infection of the neck 3)  you are referred to dr Romeo Apple. 4)  HbgA1C prior to visit, ICD-9: 5)  Hepatic Panel prior to visit, ICD-9:   fasting inn october 6)  Lipid Panel prior to visit, ICD-9: Prescriptions: TERBINAFINE HCL 1 % CREA (TERBINAFINE HCL) apply  twice daily for 10 days, then as needed  #45 gm x 1   Entered and Authorized by:   Syliva Overman MD   Signed by:   Syliva Overman MD on 09/12/2009   Method used:   Electronically to        [facility]  [address] [address] [facility] ([facility]ail)       [address]       [place], [place]  [address]       Ph: [phone]       Fax: [phone]   RxID:   [id number] HYDROXYZINE HCL 25 MG TABS (HYDROXYZINE HCL) Take 1 tab by mouth at bedtime as needed  #30 x 0   Entered and Authorized by:   Syliva Overman MD   Signed by:   Syliva Overman MD on 09/12/2009   Method used:   Electronically to        [facility]  [address] [facility] (retail)       [address]       [place],  [place]  [address]       Ph: [phone]       Fax: [phone]   RxID:   [id number] PREDNISONE (PAK) 5 MG TABS (PREDNISONE) Use as directed  #21 x 0   Entered and Authorized by:   Syliva Overman MD   Signed by:   Syliva Overman MD on 09/12/2009   Method used:   Electronically to        [facility]  [address] (retail)       [address]       [place], [place]  [address]       Ph: [phone]       Fax: [phone]   RxID:   [id number] TERBINAFINE HCL 250 MG TABS (TERBINAFINE HCL) Take 1 tablet by mouth once a day  #10 x 0   Entered and Authorized by:   Syliva Overman MD   Signed by:   Syliva Overman MD on 09/12/2009   Method used:   Electronically to        [facility] [address] [facility] ([facility]ail)       [address]       [place], [place]  [address]       Ph: [phone]       Fax: [phone]   RxID:   [id number]    Medication Administration  Injection # 1:    Medication: Depo- Medrol 80mg     Diagnosis: DERMATITIS (ICD-692.9)    Route: IM    Site: LUOQ gluteus    Exp Date: 4/12    Lot #: [id number]    Mfr: [employer]    Patient tolerated injection without complications    Given by: Adella Hare LPN (September 12, 2009 4:40 PM)  Orders Added: 1)   Est. Patient Level IV [[address]] 2)  T-Hepatic Function [80076-22960] 3)  T-Lipid Profile [80061-22930] 4)  T- Hemoglobin A1C [83036-23375] 5)  Depo- Medrol 80mg  [J1040] 6)  Admin of Therapeutic Inj  intramuscular or subcutaneous [[address]] 7)  Radiology Referral [Radiology] 8)  Orthopedic Surgeon Referral [Ortho Surgeon]

## 2010-02-26 NOTE — Progress Notes (Signed)
Summary: NASAL SPRAY  Phone Note Call from Patient   Summary of Call: WANTS TO KNOW WILL YOU SEND IN SOME MORE NASAL PRAY FOR HER AT [facility]  CALL BACK AT [phone] TO LET HER KNOW Initial call taken by: Lind Guest,  February 18, 2010 8:09 AM    Prescriptions: Aleda Grana 50 MCG/ACT SUSP (FLUTICASONE PROPIONATE) 1 puff per nostril tweice daily as needed  #3 x 1   Entered by:   Adella Hare LPN   Authorized by:   Syliva Overman MD   Signed by:   Adella Hare LPN on 11/91/4782   Method used:   Electronically to        [facility]  [address] (retail)       [address]       [place], [place]  [address]       Ph: [phone]       Fax: [phone]   RxID:   [id number]

## 2010-03-23 LAB — CONVERTED CEMR LAB
BUN: 15 mg/dL (ref 6–23)
Basophils Absolute: 0 10*3/uL (ref 0.0–0.1)
Basophils Relative: 1 % (ref 0–1)
CO2: 26 meq/L (ref 19–32)
Calcium: 9.5 mg/dL (ref 8.4–10.5)
Chloride: 105 meq/L (ref 96–112)
Cholesterol: 189 mg/dL (ref 0–200)
Creatinine, Ser: 0.76 mg/dL (ref 0.40–1.20)
Eosinophils Absolute: 0.2 10*3/uL (ref 0.0–0.7)
Eosinophils Relative: 3 % (ref 0–5)
Glucose, Bld: 101 mg/dL — ABNORMAL HIGH (ref 70–99)
HCT: 38.7 % (ref 36.0–46.0)
HDL: 68 mg/dL (ref >39)
Hemoglobin: 12.4 g/dL (ref 12.0–15.0)
Hgb A1c MFr Bld: 6.4 % — ABNORMAL HIGH (ref <5.7)
LDL Cholesterol: 111 mg/dL — ABNORMAL HIGH (ref 0–99)
Lymphocytes Relative: 53 % — ABNORMAL HIGH (ref 12–46)
Lymphs Abs: 2.8 10*3/uL (ref 0.7–4.0)
MCHC: 32 g/dL (ref 30.0–36.0)
MCV: 82.5 fL (ref 78.0–100.0)
Monocytes Absolute: 0.3 10*3/uL (ref 0.1–1.0)
Monocytes Relative: 6 % (ref 3–12)
Neutro Abs: 2 10*3/uL (ref 1.7–7.7)
Neutrophils Relative %: 38 % — ABNORMAL LOW (ref 43–77)
Platelets: 326 10*3/uL (ref 150–400)
Potassium: 4.2 meq/L (ref 3.5–5.3)
RBC: 4.69 M/uL (ref 3.87–5.11)
RDW: 15.4 % (ref 11.5–15.5)
Sodium: 141 meq/L (ref 135–145)
TSH: 3.092 microintl units/mL (ref 0.350–4.500)
Total CHOL/HDL Ratio: 2.8
Triglycerides: 49 mg/dL (ref <150)
VLDL: 10 mg/dL (ref 0–40)
WBC: 5.2 10*3/uL (ref 4.0–10.5)

## 2010-03-24 ENCOUNTER — Encounter: Payer: Self-pay | Admitting: Family Medicine

## 2010-03-24 ENCOUNTER — Ambulatory Visit (INDEPENDENT_AMBULATORY_CARE_PROVIDER_SITE_OTHER): Payer: BC Managed Care – PPO | Admitting: Family Medicine

## 2010-03-24 DIAGNOSIS — E663 Overweight: Secondary | ICD-10-CM

## 2010-03-24 DIAGNOSIS — I10 Essential (primary) hypertension: Secondary | ICD-10-CM

## 2010-03-24 DIAGNOSIS — E785 Hyperlipidemia, unspecified: Secondary | ICD-10-CM

## 2010-04-08 NOTE — Assessment & Plan Note (Signed)
Summary: follow up 4 mth/slj   Vital Signs:  Patient profile:   55 year old female Menstrual status:  irregular Height:      68 inches Weight:      192 pounds BMI:     29.30 O2 Sat:      97 % Pulse rate:   73 / minute Pulse rhythm:   regular Resp:     16 per minute BP sitting:   140 / 80  (left arm) Cuff size:   large  Vitals Entered By: Everitt Amber LPN (March 24, 3662 4:06 PM)  Nutrition Counseling: Patient's BMI is greater than 25 and therefore counseled on weight management options. CC: Follow up chronic problems, wants bump under right breast checked, it has been there for years   Primary Care Provider:  Syliva Overman MD  CC:  Follow up chronic problems, wants bump under right breast checked, and it has been there for years.  History of Present Illness: Reports  that she is doing fairly well. She is under increased stress, her spousehas not worked for 2 years, and is experiencing increased mental health challenges, sometimes reportedly threatening suicide, i have advised she accompany him to mental health She has not been as dilient with exercise and diet, but intends to change this Denies recent fever or chills. Denies sinus pressure, nasal congestion , ear pain or sore throat. Denies chest congestion, or cough productive of sputum. Denies chest pain, palpitations, PND, orthopnea or leg swelling. Denies abdominal pain, nausea, vomitting, diarrhea or constipation. Denies change in bowel movements or bloody stool. Denies dysuria , frequency, incontinence or hesitancy. Denies  joint pain, swelling, or reduced mobility. Denies headaches, vertigo, seizures.  Denies  rash, lesions, or itch.     Current Medications (verified): 1)  Klor-Con M20 20 Meq  Cr-Tabs (Potassium Chloride Crys Cr) .... One Tab By Mouth Once Daily 2)  Hydrochlorothiazide 25 Mg  Tabs (Hydrochlorothiazide) .... One Tab By Mouth Once Daily 3)  Flonase 50 Mcg/act Susp (Fluticasone Propionate) .Marland Kitchen.. 1  Puff Per Nostril Tweice Daily As Needed 4)  Lovastatin 40 Mg Tabs (Lovastatin) .... Take 1 Tab By Mouth At Bedtime 5)  Clotrimazole-Betamethasone 1-0.05 % Crea (Clotrimazole-Betamethasone) .... Apply Twice Daily To Affected Area For 10 Days , Then As Needed  Allergies (verified): 1)  ! Penicillin  Review of Systems Derm:  Complains of lesion(s); painless lesion under right breast . Psych:  Complains of anxiety; denies depression, easily angered, and mental problems. Endo:  Denies cold intolerance, excessive hunger, excessive thirst, and excessive urination. Heme:  Denies abnormal bruising, bleeding, and enlarge lymph nodes. Allergy:  Complains of seasonal allergies; denies hives or rash and itching eyes; mild.  Physical Exam  General:  Well-developedoverweight,in no acute distress; alert,appropriate and cooperative throughout examination HEENT: No facial asymmetry,  EOMI, No sinus tenderness, TM's Clear, oropharynx  pink and moist.   Chest: Clear to auscultation bilaterally.  CVS: S1, S2, No murmurs, No S3.   Abd: Soft, Nontender.  MS: Adequate ROM spine, hips, shoulders and knees.  Ext: No edema.   CNS: CN 2-12 intact, power tone and sensation normal throughout.   Skin: Intact, no visible lesions or rashes.  Psych: Good eye contact, normal affect.  Memory intact,mildly anxious not  depressed appearing.    Impression & Recommendations:  Problem # 1:  OVERWEIGHT (ICD-278.02) Assessment Unchanged  Ht: 68 (03/24/2010)   Wt: 192 (03/24/2010)   BMI: 29.30 (03/24/2010) therapeutic lifestyle change discussed and encouraged  Problem #  2:  HYPERTENSION (ICD-401.9) Assessment: Deteriorated  Her updated medication list for this problem includes:    Hydrochlorothiazide 25 Mg Tabs (Hydrochlorothiazide) ..... One tab by mouth once daily  Orders: T-Basic Metabolic Panel [id number])  BP today: 140/80 Prior BP: 116/68 (11/20/2009)  Labs Reviewed: K+: 4.2 (03/22/2010) Creat: :  0.76 (03/22/2010)   Chol: 189 (03/22/2010)   HDL: 68 (03/22/2010)   LDL: 111 (03/22/2010)   TG: 49 (03/22/2010)  Problem # 3:  IMPAIRED GLUCOSE TOLERANCE (ICD-271.3) Assessment: Deteriorated deteriorated , hBA1C up to 6.4 from 6.1 Pt advised to reduce carbohydrate intake, espescially sweets, and to start regular physical activity, at least 30 minutes 5 days weekly, to enable weight loss, and reduce the risk of becoming diabetic   Problem # 4:  HYPERLIPIDEMIA (ICD-272.4) Assessment: Improved  Her updated medication list for this problem includes:    Lovastatin 40 Mg Tabs (Lovastatin) .Marland Kitchen... Take 1 tab by mouth at bedtime  Orders: T-Hepatic Function [id number]) T-Lipid Profile [phone])  Labs Reviewed: SGOT: 22 (11/16/2009)   SGPT: 15 (11/16/2009)   HDL:68 (03/22/2010), 67 (11/16/2009)  LDL:111 (03/22/2010), 138 (11/16/2009)  Chol:189 (03/22/2010), 216 (11/16/2009)  Trig:49 (03/22/2010), 57 (11/16/2009)  Complete Medication List: 1)  Klor-con M20 20 Meq Cr-tabs (Potassium chloride crys cr) .... One tab by mouth once daily 2)  Hydrochlorothiazide 25 Mg Tabs (Hydrochlorothiazide) .... One tab by mouth once daily 3)  Flonase 50 Mcg/act Susp (Fluticasone propionate) .Marland Kitchen.. 1 puff per nostril tweice daily as needed 4)  Lovastatin 40 Mg Tabs (Lovastatin) .... Take 1 tab by mouth at bedtime 5)  Clotrimazole-betamethasone 1-0.05 % Crea (Clotrimazole-betamethasone) .... Apply twice daily to affected area for 10 days , then as needed  Other Orders: T- Hemoglobin A1C ([id number])  Patient Instructions: 1)  cpe in 4 months. 2)  It is important that you exercise regularly at least 40 minutes 5 times a week. If you develop chest pain, have severe difficulty breathing, or feel very tired , stop exercising immediately and seek medical attention. 3)  You need to lose weight. Consider a lower calorie diet and regular exercise.  4)  BMP prior to visit, ICD-9: 5)  Hepatic Panel prior to visit,  ICD-9: 6)  Lipid Panel prior to visit, ICD-9:  fasting in 4 months. 7)  HbgA1C prior to visit, ICD-9: 8)   I hope that the stress becomes less at home soon,pls get help for your husbandl Prescriptions: LOVASTATIN 40 MG TABS (LOVASTATIN) Take 1 tab by mouth at bedtime  #90 x 1   Entered by:   Adella Hare LPN   Authorized by:   Syliva Overman MD   Signed by:   Adella Hare LPN on 28/41/3244   Method used:   Electronically to        [facility]  [address] (retail)       [address]       [place], [place]  [address]       Ph: [phone]       Fax: [phone]   RxID:   [id number]    Orders Added: 1)  Est. Patient Level IV [63016] 2)  T-Basic Metabolic Panel [[id number]] 3)  T-Hepatic Function [[id number]] 4)  T-Lipid Profile [[id number]] 5)  T- Hemoglobin A1C [[id number]]

## 2010-06-03 NOTE — Op Note (Signed)
Megan Villa              ACCOUNT NO.:  [id number]   MEDICAL RECORD NO.:  [id number]          PATIENT TYPE:  AMB   LOCATION:  DAY                           FACILITY:  [facility]   PHYSICIAN:  Lionel December, M.D.    DATE OF BIRTH:  1955/04/15   DATE OF PROCEDURE:  05/03/2008  DATE OF DISCHARGE:                               OPERATIVE REPORT   PROCEDURE:  Colonoscopy.   INDICATIONS:  Megan Villa is a 55 year old African American female who is  undergoing screening colonoscopy.  Family history is positive for CRC,  but not in a first-degree relative.  She has a niece with history of  CRC.  The procedure risks were reviewed with the patient and informed  consent was obtained.   MEDICATIONS FOR CONSCIOUS SEDATION:  Demerol 50 mg IV, Versed 8 mg IV.   FINDINGS:  Procedure performed in endoscopy suite.  The patient's vital  signs and O2 sat were monitoring during the procedure and remained  stable.  She was very anxious, but seemed to settle down with Versed.  The patient was placed in the left lateral position and a rectal  examination performed.  No abnormality noted on external or digital  exam.  The Pentax videoscope was placed in the rectum and advanced under  vision into the sigmoid colon and beyond.  Preparation was satisfactory.  The scope was passed into the cecum which was identified by the  appendiceal orifice and the ileocecal valve.  Pictures taken for the  record.  This area was carefully examined and no abnormality was noted.  The scope was slowly withdrawn with careful inspection of mucosa and no  polyps, tumor, masses or other  abnormalities were noted.  Rectal mucosa  similarly was normal.  The scope was retroflexed to examined the  anorectal junction which was unremarkable.  The endoscope was  straightened and withdrawn.  The patient tolerated the procedure well.   Withdrawal time was 9 minutes.   FINAL DIAGNOSIS:  Normal colonoscopy.   RECOMMENDATIONS:  1. Standard  instructions given.  2. Yearly hemoccults.  3. Next screening exam will be in 10 years.      Lionel December, M.D.  Electronically Signed     NR/MEDQ  D:  05/03/2008  T:  05/03/2008  Job:  [id number]   cc:   Milus Mallick. Lodema Hong, M.D.  Fax: [phone]

## 2010-06-23 ENCOUNTER — Telehealth: Payer: Self-pay | Admitting: Family Medicine

## 2010-06-23 MED ORDER — HYDROCHLOROTHIAZIDE 25 MG PO TABS: 25.0000 mg | ORAL_TABLET | Freq: Every day | ORAL | Status: DC

## 2010-06-23 MED ORDER — POTASSIUM CHLORIDE CRYS ER 20 MEQ PO TBCR: 20.0000 meq | EXTENDED_RELEASE_TABLET | Freq: Every day | ORAL | Status: DC

## 2010-06-23 MED ORDER — FLUTICASONE PROPIONATE 50 MCG/ACT NA SUSP: 2.0000 | Freq: Every day | NASAL | Status: DC

## 2010-06-23 NOTE — Telephone Encounter (Signed)
Sent as requested.

## 2010-06-24 ENCOUNTER — Telehealth: Payer: Self-pay | Admitting: Family Medicine

## 2010-06-24 MED ORDER — HYDROCHLOROTHIAZIDE 25 MG PO TABS: 25.0000 mg | ORAL_TABLET | Freq: Every day | ORAL | Status: DC

## 2010-06-24 NOTE — Telephone Encounter (Signed)
Resent as requested.  

## 2010-06-26 ENCOUNTER — Telehealth: Payer: Self-pay | Admitting: Family Medicine

## 2010-06-26 MED ORDER — FLUTICASONE PROPIONATE 50 MCG/ACT NA SUSP: 2.0000 | Freq: Every day | NASAL | Status: DC

## 2010-06-26 MED ORDER — POTASSIUM CHLORIDE CRYS ER 20 MEQ PO TBCR: 20.0000 meq | EXTENDED_RELEASE_TABLET | Freq: Every day | ORAL | Status: DC

## 2010-06-26 MED ORDER — HYDROCHLOROTHIAZIDE 25 MG PO TABS: 25.0000 mg | ORAL_TABLET | Freq: Every day | ORAL | Status: DC

## 2010-06-26 NOTE — Telephone Encounter (Signed)
Resent as requested.  

## 2010-07-18 ENCOUNTER — Telehealth: Payer: Self-pay | Admitting: Family Medicine

## 2010-07-18 MED ORDER — POTASSIUM CHLORIDE CRYS ER 20 MEQ PO TBCR: 20.0000 meq | EXTENDED_RELEASE_TABLET | Freq: Every day | ORAL | Status: DC

## 2010-07-18 NOTE — Telephone Encounter (Signed)
meds sent as requested 

## 2010-07-25 ENCOUNTER — Other Ambulatory Visit: Payer: Self-pay | Admitting: Family Medicine

## 2010-07-25 LAB — HEMOGLOBIN A1C
Hgb A1c MFr Bld: 6.3 % — ABNORMAL HIGH (ref <5.7)
Mean Plasma Glucose: 134 mg/dL — ABNORMAL HIGH (ref <117)

## 2010-07-25 LAB — HEPATIC FUNCTION PANEL
ALT: 15 U/L (ref 0–35)
AST: 18 U/L (ref 0–37)
Albumin: 4.5 g/dL (ref 3.5–5.2)
Alkaline Phosphatase: 85 U/L (ref 39–117)
Bilirubin, Direct: 0.1 mg/dL (ref 0.0–0.3)
Indirect Bilirubin: 0.3 mg/dL (ref 0.0–0.9)
Total Bilirubin: 0.4 mg/dL (ref 0.3–1.2)
Total Protein: 6.9 g/dL (ref 6.0–8.3)

## 2010-07-25 LAB — LIPID PANEL
Cholesterol: 251 mg/dL — ABNORMAL HIGH (ref 0–200)
HDL: 72 mg/dL (ref >39)
LDL Cholesterol: 169 mg/dL — ABNORMAL HIGH (ref 0–99)
Total CHOL/HDL Ratio: 3.5 Ratio
Triglycerides: 49 mg/dL (ref <150)
VLDL: 10 mg/dL (ref 0–40)

## 2010-07-25 LAB — BASIC METABOLIC PANEL
BUN: 14 mg/dL (ref 6–23)
CO2: 30 mEq/L (ref 19–32)
Calcium: 9.3 mg/dL (ref 8.4–10.5)
Chloride: 105 mEq/L (ref 96–112)
Creat: 0.73 mg/dL (ref 0.50–1.10)
Glucose, Bld: 107 mg/dL — ABNORMAL HIGH (ref 70–99)
Potassium: 4 mEq/L (ref 3.5–5.3)
Sodium: 142 mEq/L (ref 135–145)

## 2010-07-30 ENCOUNTER — Encounter: Payer: Self-pay | Admitting: Family Medicine

## 2010-08-05 ENCOUNTER — Encounter: Payer: Self-pay | Admitting: Family Medicine

## 2010-08-05 ENCOUNTER — Other Ambulatory Visit (HOSPITAL_COMMUNITY)
Admission: RE | Admit: 2010-08-05 | Discharge: 2010-08-05 | Disposition: A | Discharge status: Home / Self Care | Payer: BC Managed Care – PPO | Source: Ambulatory Visit | Attending: Family Medicine | Admitting: Family Medicine

## 2010-08-05 ENCOUNTER — Ambulatory Visit (INDEPENDENT_AMBULATORY_CARE_PROVIDER_SITE_OTHER): Payer: BC Managed Care – PPO | Admitting: Family Medicine

## 2010-08-05 VITALS — BP 130/70 | HR 86 | Resp 16 | Ht 67.25 in | Wt 187.0 lb

## 2010-08-05 DIAGNOSIS — E785 Hyperlipidemia, unspecified: Secondary | ICD-10-CM

## 2010-08-05 DIAGNOSIS — Z1211 Encounter for screening for malignant neoplasm of colon: Secondary | ICD-10-CM

## 2010-08-05 DIAGNOSIS — Z23 Encounter for immunization: Secondary | ICD-10-CM

## 2010-08-05 DIAGNOSIS — E739 Lactose intolerance, unspecified: Secondary | ICD-10-CM

## 2010-08-05 DIAGNOSIS — R7301 Impaired fasting glucose: Secondary | ICD-10-CM

## 2010-08-05 DIAGNOSIS — E663 Overweight: Secondary | ICD-10-CM

## 2010-08-05 DIAGNOSIS — Z0000 Encounter for general adult medical examination without abnormal findings: Secondary | ICD-10-CM

## 2010-08-05 DIAGNOSIS — Z01419 Encounter for gynecological examination (general) (routine) without abnormal findings: Secondary | ICD-10-CM | POA: Insufficient documentation

## 2010-08-05 DIAGNOSIS — Z124 Encounter for screening for malignant neoplasm of cervix: Secondary | ICD-10-CM

## 2010-08-05 DIAGNOSIS — I10 Essential (primary) hypertension: Secondary | ICD-10-CM

## 2010-08-05 LAB — POC HEMOCCULT BLD/STL (OFFICE/1-CARD/DIAGNOSTIC): Fecal Occult Blood, POC: NEGATIVE

## 2010-08-05 NOTE — Patient Instructions (Addendum)
F/u in 4.5 months  It is important that you exercise regularly at least 30 minutes 5 times a week. If you develop chest pain, have severe difficulty breathing, or feel very tired, stop exercising immediately and seek medical attention   A healthy diet is rich in fruit, vegetables and whole grains. Poultry fish, nuts and beans are a healthy choice for protein rather then red meat. A low sodium diet and drinking 64 ounces of water daily is generally recommended. Oils and sweet should be limited. Carbohydrates especially for those who are diabetic or overweight, should be limited to 34-45 gram per meal. It is important to eat on a regular schedule, at least 3 times daily. Snacks should be primarily fruits, vegetables or nuts.  Fasting lipid, hepatic , chem7 , hBA1c in 4.5 months TdAP today

## 2010-08-06 MED ORDER — POTASSIUM CHLORIDE CRYS ER 20 MEQ PO TBCR: 20.0000 meq | EXTENDED_RELEASE_TABLET | Freq: Every day | ORAL | Status: DC

## 2010-08-06 MED ORDER — HYDROCHLOROTHIAZIDE 25 MG PO TABS: 25.0000 mg | ORAL_TABLET | Freq: Every day | ORAL | Status: DC

## 2010-08-06 MED ORDER — LOVASTATIN 40 MG PO TABS: 40.0000 mg | ORAL_TABLET | Freq: Every day | ORAL | Status: DC

## 2010-08-06 MED ORDER — CLOTRIMAZOLE 1 % EX CREA: TOPICAL_CREAM | Freq: Two times a day (BID) | CUTANEOUS | Status: DC

## 2010-08-06 MED ORDER — FLUTICASONE PROPIONATE 50 MCG/ACT NA SUSP: 2.0000 | Freq: Every day | NASAL | Status: DC

## 2010-08-06 NOTE — Progress Notes (Signed)
  Subjective:    Patient ID: Megan Villa, female    DOB: 07-Oct-1955, 55 y.o.   MRN: [id number]  HPI The PT is here for annual exam  and re-evaluation of chronic medical conditions, medication management and review of any  recent lab and radiology data.  Preventive health is updated, specifically  Cancer screening,  and Immunization.   Questions or concerns regarding consultations or procedures which the PT has had in the interim are  addressed. The PT denies any adverse reactions to current medications since the last visit.  There are no new concerns.  There are no specific complaints       Review of Systems Denies recent fever or chills. Denies sinus pressure, nasal congestion, ear pain or sore throat. Denies chest congestion, productive cough or wheezing. Denies chest pains, palpitations, paroxysmal nocturnal dyspnea, orthopnea and leg swelling Denies abdominal pain, nausea, vomiting,diarrhea or constipation.  Denies rectal bleeding or change in bowel movement. Denies dysuria, frequency, hesitancy or incontinence. Denies joint pain, swelling and limitation in mobility. Denies headaches, seizure, numbness, or tingling. Denies uncontrolled  Depression,notes increased  Anxiety in the past 2 to 3 months, denies  insomnia. Denies skin break down or rash.        Objective:   Physical Exam Pleasant well nourished female, alert and oriented x 3, in no cardio-pulmonary distress. Afebrile. HEENT No facial trauma or asymetry.   EOMI, PERTL, fundoscopic exam is normal, no hemorhage or exudate.  External ears normal, tympanic membranes clear. Oropharynx moist, no exudate, fair dentition. Neck: supple, no adenopathy,JVD or thyromegaly.No bruits.  Chest: Clear to ascultation bilaterally.No crackles or wheezes. Non tender to palpation  Breast: No asymetry,no masses. No nipple discharge or inversion. No axillary or supraclavicular adenopathy  Cardiovascular system; Heart sounds  normal,  S1 and  S2 ,no S3.  No murmur, or thrill. Apical beat not displaced Peripheral pulses normal.  Abdomen: Soft, non tender, no organomegaly or masses. No bruits. Bowel sounds normal. No guarding, tenderness or rebound.  Rectal:  No mass. guaic negative stool.  GU: External genitalia normal. No lesions. Vaginal canal normal.No discharge. Uterus normal size, no adnexal masses, no cervical motion or adnexal tenderness.  Musculoskeletal exam: Full ROM of spine, hips , shoulders and knees. No deformity ,swelling or crepitus noted. No muscle wasting or atrophy.   Neurologic: Cranial nerves 2 to 12 intact. Power, tone ,sensation and reflexes normal throughout. No disturbance in gait. No tremor.  Skin: Intact, no ulceration, erythema , scaling or rash noted. Pigmentation normal throughout  Psych; Normal mood and affect. Judgement and concentration normal        Assessment & Plan:

## 2010-08-06 NOTE — Assessment & Plan Note (Signed)
Unchanged, pt encouraged to work consistently on weight loss

## 2010-08-06 NOTE — Assessment & Plan Note (Signed)
Unchanged , importance of modification of diet and commitment to regular exercise stressed

## 2010-08-06 NOTE — Assessment & Plan Note (Signed)
Deteriorated, non compliant with med, question of muscle aches, will start 3 times weekly and inc as able

## 2010-08-06 NOTE — Assessment & Plan Note (Signed)
Controlled, no change in medication  

## 2010-10-20 ENCOUNTER — Telehealth: Payer: Self-pay | Admitting: Family Medicine

## 2010-10-20 MED ORDER — POTASSIUM CHLORIDE CRYS ER 20 MEQ PO TBCR: 20.0000 meq | EXTENDED_RELEASE_TABLET | Freq: Every day | ORAL | Status: DC

## 2010-10-20 NOTE — Telephone Encounter (Signed)
Med sent as requested 

## 2010-11-21 ENCOUNTER — Telehealth: Payer: Self-pay | Admitting: Family Medicine

## 2010-11-21 MED ORDER — POTASSIUM CHLORIDE CRYS ER 20 MEQ PO TBCR: 20.0000 meq | EXTENDED_RELEASE_TABLET | Freq: Every day | ORAL | Status: DC

## 2010-11-21 MED ORDER — HYDROCHLOROTHIAZIDE 25 MG PO TABS: 25.0000 mg | ORAL_TABLET | Freq: Every day | ORAL | Status: DC

## 2010-11-21 NOTE — Telephone Encounter (Signed)
She is out of the medicine

## 2010-12-03 NOTE — Telephone Encounter (Signed)
Was sent in to pharmacy

## 2010-12-08 ENCOUNTER — Encounter: Payer: Self-pay | Admitting: Family Medicine

## 2010-12-09 LAB — LIPID PANEL
Cholesterol: 231 mg/dL — ABNORMAL HIGH (ref 0–200)
HDL: 71 mg/dL (ref >39)
LDL Cholesterol: 150 mg/dL — ABNORMAL HIGH (ref 0–99)
Total CHOL/HDL Ratio: 3.3 Ratio
Triglycerides: 51 mg/dL (ref <150)
VLDL: 10 mg/dL (ref 0–40)

## 2010-12-09 LAB — BASIC METABOLIC PANEL
BUN: 11 mg/dL (ref 6–23)
CO2: 27 mEq/L (ref 19–32)
Calcium: 9.7 mg/dL (ref 8.4–10.5)
Chloride: 105 mEq/L (ref 96–112)
Creat: 0.63 mg/dL (ref 0.50–1.10)
Glucose, Bld: 113 mg/dL — ABNORMAL HIGH (ref 70–99)
Potassium: 4.1 mEq/L (ref 3.5–5.3)
Sodium: 142 mEq/L (ref 135–145)

## 2010-12-09 LAB — HEPATIC FUNCTION PANEL
ALT: 12 U/L (ref 0–35)
AST: 16 U/L (ref 0–37)
Albumin: 4.4 g/dL (ref 3.5–5.2)
Alkaline Phosphatase: 92 U/L (ref 39–117)
Bilirubin, Direct: 0.1 mg/dL (ref 0.0–0.3)
Indirect Bilirubin: 0.2 mg/dL (ref 0.0–0.9)
Total Bilirubin: 0.3 mg/dL (ref 0.3–1.2)
Total Protein: 7 g/dL (ref 6.0–8.3)

## 2010-12-09 LAB — HEMOGLOBIN A1C
Hgb A1c MFr Bld: 6.2 % — ABNORMAL HIGH (ref <5.7)
Mean Plasma Glucose: 131 mg/dL — ABNORMAL HIGH (ref <117)

## 2010-12-18 ENCOUNTER — Other Ambulatory Visit: Payer: Self-pay | Admitting: Family Medicine

## 2010-12-18 ENCOUNTER — Ambulatory Visit (INDEPENDENT_AMBULATORY_CARE_PROVIDER_SITE_OTHER): Payer: BC Managed Care – PPO | Admitting: Family Medicine

## 2010-12-18 ENCOUNTER — Encounter: Payer: Self-pay | Admitting: Family Medicine

## 2010-12-18 VITALS — BP 128/62 | HR 72 | Resp 16 | Ht 67.0 in | Wt 191.0 lb

## 2010-12-18 DIAGNOSIS — R7301 Impaired fasting glucose: Secondary | ICD-10-CM

## 2010-12-18 DIAGNOSIS — R5381 Other malaise: Secondary | ICD-10-CM

## 2010-12-18 DIAGNOSIS — E785 Hyperlipidemia, unspecified: Secondary | ICD-10-CM

## 2010-12-18 DIAGNOSIS — R7309 Other abnormal glucose: Secondary | ICD-10-CM

## 2010-12-18 DIAGNOSIS — E663 Overweight: Secondary | ICD-10-CM

## 2010-12-18 DIAGNOSIS — Z139 Encounter for screening, unspecified: Secondary | ICD-10-CM

## 2010-12-18 DIAGNOSIS — I10 Essential (primary) hypertension: Secondary | ICD-10-CM

## 2010-12-18 DIAGNOSIS — R5383 Other fatigue: Secondary | ICD-10-CM

## 2010-12-18 DIAGNOSIS — R7303 Prediabetes: Secondary | ICD-10-CM

## 2010-12-18 NOTE — Patient Instructions (Addendum)
F/u end March   Your blood sugar average is better but still high, cut back on carbs and start regular exercise.  Fasting lipid, chem 7, hepatic, hBA1C, TSH , cbc and Vit D in March.  Cholesterol is too high, start taking the medication every night  It is important that you exercise regularly at least 30 minutes 5 times a week. If you develop chest pain, have severe difficulty breathing, or feel very tired, stop exercising immediately and seek medical attention  A healthy diet is rich in fruit, vegetables and whole grains. Poultry fish, nuts and beans are a healthy choice for protein rather then red meat. A low sodium diet and drinking 64 ounces of water daily is generally recommended. Oils and sweet should be limited. Carbohydrates especially for those who are diabetic or overweight, should be limited to 34-45 gram per meal. It is important to eat on a regular schedule, at least 3 times daily. Snacks should be primarily fruits, vegetables or nuts. Weight losss goal of approx 8 pounds  Mammogram past due please schedule

## 2010-12-18 NOTE — Progress Notes (Signed)
  Subjective:    Patient ID: Megan Villa, female    DOB: 1955-05-05, 55 y.o.   MRN: [id number]  HPI The PT is here for follow up and re-evaluation of chronic medical conditions, medication management and review of any available recent lab and radiology data.  Preventive health is updated, specifically  Cancer screening and Immunization.   Questions or concerns regarding consultations or procedures which the PT has had in the interim are  addressed. The PT denies any adverse reactions to current medications since the last visit.  Concerned that she is unable to exercise as she should due to a heavy work schedule, on avg 50 hrs per week. There are no specific complaints       Review of Systems See HPI Denies recent fever or chills. Denies sinus pressure, nasal congestion, ear pain or sore throat. Denies chest congestion, productive cough or wheezing. Denies chest pains, palpitations and leg swelling Denies abdominal pain, nausea, vomiting,diarrhea or constipation.   Denies dysuria, frequency, hesitancy or incontinence. Denies joint pain, swelling and limitation in mobility. Denies headaches, seizures, numbness, or tingling. Denies depression,  or insomnia.Still has anxiety , spouse has been unemployed for the past approx 2 years. Denies skin break down or rash.        Objective:   Physical Exam Patient alert and oriented and in no cardiopulmonary distress.  HEENT: No facial asymmetry, EOMI, no sinus tenderness,  oropharynx pink and moist.  Neck supple no adenopathy.  Chest: Clear to auscultation bilaterally.  CVS: S1, S2 no murmurs, no S3.  ABD: Soft non tender. Bowel sounds normal.  Ext: No edema  MS: Adequate ROM spine, shoulders, hips and knees.  Skin: Intact, no ulcerations or rash noted.  Psych: Good eye contact, normal affect. Memory intact not anxious or depressed appearing.  CNS: CN 2-12 intact, power, tone and sensation normal throughout.          Assessment & Plan:

## 2010-12-19 NOTE — Assessment & Plan Note (Signed)
Controlled, no change in medication  

## 2010-12-19 NOTE — Assessment & Plan Note (Signed)
Unchanged. Patient re-educated about  the importance of commitment to a  minimum of 150 minutes of exercise per week. The importance of healthy food choices with portion control discussed. Encouraged to start a food diary, count calories and to consider  joining a support group. Sample diet sheets offered. Goals set by the patient for the next several months.    

## 2010-12-19 NOTE — Assessment & Plan Note (Signed)
Slight improvement in HBA1C. The importance of lifestyle change and weight loss is stressed

## 2010-12-19 NOTE — Assessment & Plan Note (Signed)
Unchanged, pt to take med as prescribed , which is daily, has been taking med on avg 3 days per week. Low fat diet discussed and encouraged also

## 2010-12-22 ENCOUNTER — Ambulatory Visit (HOSPITAL_COMMUNITY)
Admission: RE | Admit: 2010-12-22 | Discharge: 2010-12-22 | Disposition: A | Discharge status: Home / Self Care | Payer: BC Managed Care – PPO | Source: Ambulatory Visit | Attending: Family Medicine | Admitting: Family Medicine

## 2010-12-22 DIAGNOSIS — Z139 Encounter for screening, unspecified: Secondary | ICD-10-CM

## 2010-12-22 DIAGNOSIS — Z1231 Encounter for screening mammogram for malignant neoplasm of breast: Secondary | ICD-10-CM | POA: Insufficient documentation

## 2011-01-19 ENCOUNTER — Ambulatory Visit (INDEPENDENT_AMBULATORY_CARE_PROVIDER_SITE_OTHER): Payer: BC Managed Care – PPO | Admitting: Family Medicine

## 2011-01-19 ENCOUNTER — Telehealth: Payer: Self-pay

## 2011-01-19 ENCOUNTER — Encounter: Payer: Self-pay | Admitting: Family Medicine

## 2011-01-19 VITALS — BP 124/72 | HR 86 | Temp 98.4°F | Resp 18 | Ht 67.0 in | Wt 182.0 lb

## 2011-01-19 DIAGNOSIS — I10 Essential (primary) hypertension: Secondary | ICD-10-CM

## 2011-01-19 DIAGNOSIS — N3090 Cystitis, unspecified without hematuria: Secondary | ICD-10-CM

## 2011-01-19 DIAGNOSIS — R110 Nausea: Secondary | ICD-10-CM

## 2011-01-19 DIAGNOSIS — K529 Noninfective gastroenteritis and colitis, unspecified: Secondary | ICD-10-CM

## 2011-01-19 DIAGNOSIS — R42 Dizziness and giddiness: Secondary | ICD-10-CM

## 2011-01-19 DIAGNOSIS — E663 Overweight: Secondary | ICD-10-CM

## 2011-01-19 DIAGNOSIS — N390 Urinary tract infection, site not specified: Secondary | ICD-10-CM

## 2011-01-19 DIAGNOSIS — K5289 Other specified noninfective gastroenteritis and colitis: Secondary | ICD-10-CM

## 2011-01-19 MED ORDER — PROMETHAZINE HCL 12.5 MG PO TABS: 12.5000 mg | ORAL_TABLET | Freq: Four times a day (QID) | ORAL | Status: AC | PRN

## 2011-01-19 MED ORDER — ONDANSETRON HCL 4 MG/2ML IJ SOLN
4.0000 mg | Freq: Once | INTRAMUSCULAR | Status: AC
  Administered 2011-01-19: 4 mg via INTRAMUSCULAR

## 2011-01-19 MED ORDER — MECLIZINE HCL 12.5 MG PO TABS: 12.5000 mg | ORAL_TABLET | Freq: Three times a day (TID) | ORAL | Status: DC | PRN

## 2011-01-19 NOTE — Patient Instructions (Signed)
F/U as before.  You need to stop the flonase for now. You have overused it, and that is why your nostrils are dry, and also your throat.  Pls follow the BRAT diet until the nausea goes away and you feel better. Increase food intake as tolerated and you are able. Ensure adequate fluid intake.  Your urine will be checked, if abnormal antibiotics will be prescribed.  You will get Zofran injection for nausea, also med sent to your pharmacy for nausea and vertigo, and you will get tylenol two tablets in the office for neck pain

## 2011-01-19 NOTE — Telephone Encounter (Addendum)
Patient c/o sinus pain and feeling off balance for almost a week. States she thinks she has a sinus infection. Insists on being seen although I told her that we closed at 3 and you would not be here until after lunch. She did not want something called in, wanted to be seen. Wanted me to pass the message to you

## 2011-01-19 NOTE — Telephone Encounter (Signed)
Pt has appt on 01/19/2011 2:00. Pt aware

## 2011-01-19 NOTE — Telephone Encounter (Signed)
pls work in pt todauy if able, thanks, I am now aware office is closing at 3 so do as able pls

## 2011-01-20 DIAGNOSIS — N3090 Cystitis, unspecified without hematuria: Secondary | ICD-10-CM | POA: Insufficient documentation

## 2011-01-20 DIAGNOSIS — K529 Noninfective gastroenteritis and colitis, unspecified: Secondary | ICD-10-CM | POA: Insufficient documentation

## 2011-01-20 NOTE — Assessment & Plan Note (Signed)
Acute symptoms, will check CCUA, if abnormal will treat/send for c/s

## 2011-01-20 NOTE — Progress Notes (Signed)
Subjective:     Patient ID: Megan Villa, female   DOB: [birth year], 56 y.o.   MRN: [id number]  HPI 1 week h/o head congestion, nasal stuffiness, and dry feeling in the throat. Pt reports using her flonase spray excessively during this . Denies any fever , had chills 1 day last week, and reports a fullness in the ears. She has also experienced mild vertigo during this time. She has had nausea and cramping abdominal pain, also some loose stools, which have resolved in the last 3 days, never vomited, though nausea persists. She reports weakness and has had a 10 pound weight loss, she believes most has occurred in the past week  Review of Systems See HPI Denies recent fever. . Denies chest congestion, productive cough or wheezing. Denies chest pains, palpitations and leg swelling  C/o mild dysuria, and frequency in the past 2 days Denies joint pain, swelling and limitation in mobility. Denies headaches, seizures, numbness, or tingling. Denies depression, anxiety or insomnia. Denies skin break down or rash.        Objective:   Physical Exam    Patient alert and oriented and in no cardiopulmonary distress.  HEENT: No facial asymmetry, EOMI, no sinus tenderness,  oropharynx pink and moist.  Neck supple no adenopathy.Nasal mucosa erythematous and edematous  Chest: Clear to auscultation bilaterally.  CVS: S1, S2 no murmurs, no S3.  ABD: Soft mild suprapubic tenderness, no guarding or rebound , hyperactive bowel sounds  Ext: No edema  MS: Adequate ROM spine, shoulders, hips and knees.  Skin: Intact, no ulcerations or rash noted.  Psych: Good eye contact, normal affect. Memory intact not anxious or depressed appearing.  CNS: CN 2-12 intact, power, tone and sensation normal throughout.  Assessment:         Plan:

## 2011-01-20 NOTE — Assessment & Plan Note (Signed)
Improved. Pt applauded on succesful weight loss through lifestyle change, and encouraged to continue same. Weight loss goal set for the next several months.  

## 2011-01-20 NOTE — Assessment & Plan Note (Signed)
Acute episode, pt educated, BRAT provided, pt educated

## 2011-01-20 NOTE — Assessment & Plan Note (Signed)
Uncontrolled and moderately severe, zofran admin in office and med prescribed

## 2011-01-20 NOTE — Assessment & Plan Note (Signed)
Controlled, no change in medication  

## 2011-01-20 NOTE — Assessment & Plan Note (Signed)
Acute, mild episode med prescribed and pt educated

## 2011-01-21 LAB — URINE CULTURE: Colony Count: 15000

## 2011-03-13 NOTE — Telephone Encounter (Signed)
aware

## 2011-04-12 LAB — COMPREHENSIVE METABOLIC PANEL
ALT: 12 U/L (ref 0–35)
AST: 20 U/L (ref 0–37)
Albumin: 4.4 g/dL (ref 3.5–5.2)
Alkaline Phosphatase: 86 U/L (ref 39–117)
BUN: 12 mg/dL (ref 6–23)
CO2: 30 mEq/L (ref 19–32)
Calcium: 9.7 mg/dL (ref 8.4–10.5)
Chloride: 105 mEq/L (ref 96–112)
Creat: 0.72 mg/dL (ref 0.50–1.10)
Glucose, Bld: 98 mg/dL (ref 70–99)
Potassium: 4.1 mEq/L (ref 3.5–5.3)
Sodium: 141 mEq/L (ref 135–145)
Total Bilirubin: 0.5 mg/dL (ref 0.3–1.2)
Total Protein: 6.8 g/dL (ref 6.0–8.3)

## 2011-04-12 LAB — LIPID PANEL
Cholesterol: 197 mg/dL (ref 0–200)
HDL: 71 mg/dL (ref >39)
LDL Cholesterol: 115 mg/dL — ABNORMAL HIGH (ref 0–99)
Total CHOL/HDL Ratio: 2.8 Ratio
Triglycerides: 57 mg/dL (ref <150)
VLDL: 11 mg/dL (ref 0–40)

## 2011-04-12 LAB — HEPATIC FUNCTION PANEL
ALT: 12 U/L (ref 0–35)
AST: 20 U/L (ref 0–37)
Albumin: 4.4 g/dL (ref 3.5–5.2)
Alkaline Phosphatase: 86 U/L (ref 39–117)
Bilirubin, Direct: 0.1 mg/dL (ref 0.0–0.3)
Indirect Bilirubin: 0.4 mg/dL (ref 0.0–0.9)
Total Bilirubin: 0.5 mg/dL (ref 0.3–1.2)
Total Protein: 6.8 g/dL (ref 6.0–8.3)

## 2011-04-12 LAB — CBC
HCT: 41.3 % (ref 36.0–46.0)
Hemoglobin: 12.6 g/dL (ref 12.0–15.0)
MCH: 26.9 pg (ref 26.0–34.0)
MCHC: 30.5 g/dL (ref 30.0–36.0)
MCV: 88.1 fL (ref 78.0–100.0)
Platelets: 353 10*3/uL (ref 150–400)
RBC: 4.69 MIL/uL (ref 3.87–5.11)
RDW: 16 % — ABNORMAL HIGH (ref 11.5–15.5)
WBC: 5 10*3/uL (ref 4.0–10.5)

## 2011-04-12 LAB — HEMOGLOBIN A1C
Hgb A1c MFr Bld: 6.2 % — ABNORMAL HIGH (ref <5.7)
Mean Plasma Glucose: 131 mg/dL — ABNORMAL HIGH (ref <117)

## 2011-04-12 LAB — TSH: TSH: 3.771 u[IU]/mL (ref 0.350–4.500)

## 2011-04-13 LAB — VITAMIN D 25 HYDROXY (VIT D DEFICIENCY, FRACTURES): Vit D, 25-Hydroxy: 26 ng/mL — ABNORMAL LOW (ref 30–89)

## 2011-04-16 ENCOUNTER — Encounter: Payer: Self-pay | Admitting: Family Medicine

## 2011-04-16 ENCOUNTER — Ambulatory Visit (INDEPENDENT_AMBULATORY_CARE_PROVIDER_SITE_OTHER): Payer: BC Managed Care – PPO | Admitting: Family Medicine

## 2011-04-16 VITALS — BP 120/80 | HR 77 | Resp 16 | Ht 67.0 in | Wt 175.8 lb

## 2011-04-16 DIAGNOSIS — E663 Overweight: Secondary | ICD-10-CM

## 2011-04-16 DIAGNOSIS — E739 Lactose intolerance, unspecified: Secondary | ICD-10-CM

## 2011-04-16 DIAGNOSIS — E785 Hyperlipidemia, unspecified: Secondary | ICD-10-CM

## 2011-04-16 DIAGNOSIS — I10 Essential (primary) hypertension: Secondary | ICD-10-CM

## 2011-04-16 MED ORDER — POTASSIUM CHLORIDE CRYS ER 20 MEQ PO TBCR: 20.0000 meq | EXTENDED_RELEASE_TABLET | Freq: Every day | ORAL | Status: DC

## 2011-04-16 MED ORDER — HYDROCHLOROTHIAZIDE 25 MG PO TABS: 25.0000 mg | ORAL_TABLET | Freq: Every day | ORAL | Status: DC

## 2011-04-16 MED ORDER — LOVASTATIN 40 MG PO TABS: 40.0000 mg | ORAL_TABLET | Freq: Every day | ORAL | Status: DC

## 2011-04-16 NOTE — Progress Notes (Signed)
  Subjective:    Patient ID: Megan Villa, female    DOB: 1955/10/17, 56 y.o.   MRN: [id number]  HPI The PT is here for follow up and re-evaluation of chronic medical conditions, medication management and review of any available recent lab and radiology data.  Preventive health is updated, specifically  Cancer screening and Immunization.   Questions or concerns regarding consultations or procedures which the PT has had in the interim are  addressed. The PT denies any adverse reactions to current medications since the last visit.  There are no new concerns.  Had acute gastroenteritis this past weekend, much improved  Now, and almost back to a normal diet.       Review of Systems See HPI Denies recent fever or chills. Denies sinus pressure, nasal congestion, ear pain or sore throat. Denies chest congestion, productive cough or wheezing. Denies chest pains, palpitations and leg swelling Denies abdominal pain, nausea, vomiting,diarrhea or constipation.   Denies dysuria, frequency, hesitancy or incontinence. Denies joint pain, swelling and limitation in mobility. Denies headaches, seizures, numbness, or tingling. Denies depression, anxiety or insomnia. Denies skin break down or rash.        Objective:   Physical Exam  Patient alert and oriented and in no cardiopulmonary distress.  HEENT: No facial asymmetry, EOMI, no sinus tenderness,  oropharynx pink and moist.  Neck supple no adenopathy.  Chest: Clear to auscultation bilaterally.  CVS: S1, S2 no murmurs, no S3.  ABD: Soft non tender. Bowel sounds normal.  Ext: No edema  MS: Adequate ROM spine, shoulders, hips and knees.  Skin: Intact, no ulcerations or rash noted.  Psych: Good eye contact, normal affect. Memory intact not anxious or depressed appearing.  CNS: CN 2-12 intact, power, tone and sensation normal throughout.       Assessment & Plan:

## 2011-04-16 NOTE — Patient Instructions (Addendum)
cPE in September  Blood pressure , blood sugar and cholesterol are good  It is important that you exercise regularly at least 30 minutes 5 times a week. If you develop chest pain, have severe difficulty breathing, or feel very tired, stop exercising immediately and seek medical attention    A healthy diet is rich in fruit, vegetables and whole grains. Poultry fish, nuts and beans are a healthy choice for protein rather then red meat. A low sodium diet and drinking 64 ounces of water daily is generally recommended. Oils and sweet should be limited. Carbohydrates especially for those who are diabetic or overweight, should be limited to 30-45 gram per meal. It is important to eat on a regular schedule, at least 3 times daily. Snacks should be primarily fruits, vegetables or nuts.   Continue medication you are currently taking  Start calcium with D gel capsule 1200mg cacium with 1000IU vitamin d  One daily  Fasting lipid, cmp and hBa1C in september

## 2011-04-18 NOTE — Assessment & Plan Note (Signed)
Improved, continue current medication and low fat diet

## 2011-04-18 NOTE — Assessment & Plan Note (Signed)
Deteriorated, pt needs to work more consistently on lifestyle modification to improve blood sugars

## 2011-04-18 NOTE — Assessment & Plan Note (Signed)
Improved. Pt applauded on succesful weight loss through lifestyle change, and encouraged to continue same. Weight loss goal set for the next several months.  

## 2011-04-18 NOTE — Assessment & Plan Note (Signed)
Controlled, no change in medication  

## 2011-06-26 ENCOUNTER — Telehealth: Payer: Self-pay | Admitting: Family Medicine

## 2011-06-26 MED ORDER — LOVASTATIN 40 MG PO TABS: 40.0000 mg | ORAL_TABLET | Freq: Every day | ORAL | Status: DC

## 2011-06-26 NOTE — Telephone Encounter (Signed)
Patient aware been sent in

## 2011-10-09 ENCOUNTER — Other Ambulatory Visit: Payer: Self-pay | Admitting: Family Medicine

## 2011-10-10 LAB — COMPLETE METABOLIC PANEL WITH GFR
ALT: 11 U/L (ref 0–35)
AST: 17 U/L (ref 0–37)
Albumin: 4.1 g/dL (ref 3.5–5.2)
Alkaline Phosphatase: 81 U/L (ref 39–117)
BUN: 13 mg/dL (ref 6–23)
CO2: 33 mEq/L — ABNORMAL HIGH (ref 19–32)
Calcium: 9.5 mg/dL (ref 8.4–10.5)
Chloride: 102 mEq/L (ref 96–112)
Creat: 0.69 mg/dL (ref 0.50–1.10)
GFR, Est African American: >89 mL/min
GFR, Est Non African American: >89 mL/min
Glucose, Bld: 98 mg/dL (ref 70–99)
Potassium: 3.9 mEq/L (ref 3.5–5.3)
Sodium: 139 mEq/L (ref 135–145)
Total Bilirubin: 0.3 mg/dL (ref 0.3–1.2)
Total Protein: 6.5 g/dL (ref 6.0–8.3)

## 2011-10-10 LAB — LIPID PANEL
Cholesterol: 195 mg/dL (ref 0–200)
HDL: 66 mg/dL (ref >39)
LDL Cholesterol: 119 mg/dL — ABNORMAL HIGH (ref 0–99)
Total CHOL/HDL Ratio: 3 Ratio
Triglycerides: 51 mg/dL (ref <150)
VLDL: 10 mg/dL (ref 0–40)

## 2011-10-10 LAB — HEMOGLOBIN A1C
Hgb A1c MFr Bld: 6.4 % — ABNORMAL HIGH (ref <5.7)
Mean Plasma Glucose: 137 mg/dL — ABNORMAL HIGH (ref <117)

## 2011-10-16 ENCOUNTER — Other Ambulatory Visit (HOSPITAL_COMMUNITY)
Admission: RE | Admit: 2011-10-16 | Discharge: 2011-10-16 | Disposition: A | Discharge status: Home / Self Care | Payer: BC Managed Care – PPO | Source: Ambulatory Visit | Attending: Family Medicine | Admitting: Family Medicine

## 2011-10-16 ENCOUNTER — Ambulatory Visit (INDEPENDENT_AMBULATORY_CARE_PROVIDER_SITE_OTHER): Payer: BC Managed Care – PPO | Admitting: Family Medicine

## 2011-10-16 ENCOUNTER — Encounter: Payer: Self-pay | Admitting: Family Medicine

## 2011-10-16 VITALS — BP 126/80 | HR 77 | Resp 15 | Ht 67.0 in | Wt 192.4 lb

## 2011-10-16 DIAGNOSIS — Z0000 Encounter for general adult medical examination without abnormal findings: Secondary | ICD-10-CM

## 2011-10-16 DIAGNOSIS — Z124 Encounter for screening for malignant neoplasm of cervix: Secondary | ICD-10-CM | POA: Insufficient documentation

## 2011-10-16 DIAGNOSIS — I10 Essential (primary) hypertension: Secondary | ICD-10-CM

## 2011-10-16 DIAGNOSIS — Z1211 Encounter for screening for malignant neoplasm of colon: Secondary | ICD-10-CM

## 2011-10-16 DIAGNOSIS — R7301 Impaired fasting glucose: Secondary | ICD-10-CM

## 2011-10-16 DIAGNOSIS — E739 Lactose intolerance, unspecified: Secondary | ICD-10-CM

## 2011-10-16 DIAGNOSIS — E663 Overweight: Secondary | ICD-10-CM

## 2011-10-16 DIAGNOSIS — Z23 Encounter for immunization: Secondary | ICD-10-CM

## 2011-10-16 LAB — POC HEMOCCULT BLD/STL (OFFICE/1-CARD/DIAGNOSTIC): Fecal Occult Blood, POC: NEGATIVE

## 2011-10-16 MED ORDER — CLOTRIMAZOLE 1 % EX CREA: TOPICAL_CREAM | Freq: Two times a day (BID) | CUTANEOUS | Status: DC

## 2011-10-16 MED ORDER — HYDROCHLOROTHIAZIDE 25 MG PO TABS: 25.0000 mg | ORAL_TABLET | Freq: Every day | ORAL | Status: DC

## 2011-10-16 MED ORDER — POTASSIUM CHLORIDE CRYS ER 20 MEQ PO TBCR: 20.0000 meq | EXTENDED_RELEASE_TABLET | Freq: Every day | ORAL | Status: DC

## 2011-10-16 NOTE — Patient Instructions (Addendum)
F/u in 4 month, please call if you need me before  Please commit to daily physical activity for 30 minutes outside of work  Please follow a 1500 calorie diet, and cut out snacks and sweets. Healthy snacks are fresh or frozen fruit and vegetable. We will provide you with one , also information on prediabetes  Water is the healthy drink, aim for 64 ounces daily  Weight loss goal of 3 to 4 pounds per month  We are human and all experience stress, please work on different ways to deal with stress , as we discussed, other than eating unhealthy food  HBA1c in 4 month

## 2011-10-16 NOTE — Progress Notes (Signed)
  Subjective:    Patient ID: Megan Villa, female    DOB: 1955/10/28, 56 y.o.   MRN: [id number]  HPI The PT is here for  Annual examand re-evaluation of chronic medical conditions, medication management and review of any available recent lab and radiology data.  Preventive health is updated, specifically  Cancer screening and Immunization.   There are no new concerns. Concerned about weight gain, and increased stress There are no specific complaints       Review of Systems See HPI Denies recent fever or chills. Denies sinus pressure, nasal congestion, ear pain or sore throat. Denies chest congestion, productive cough or wheezing. Denies chest pains, palpitations and leg swelling Denies abdominal pain, nausea, vomiting,diarrhea or constipation.   Denies dysuria, frequency, hesitancy or incontinence. Denies joint pain, swelling and limitation in mobility. Denies headaches, seizures, numbness, or tingling. Denies skin break down or rash.        Objective:   Physical Exam Pleasant overweight  female, alert and oriented x 3, in no cardio-pulmonary distress. Afebrile. HEENT No facial trauma or asymetry. Sinuses non tender.  EOMI, PERTL, fundoscopic exam is normal, no hemorhage or exudate.  External ears normal, tympanic membranes clear. Oropharynx moist, no exudate, fair  dentition. Neck: supple, no adenopathy,JVD or thyromegaly.No bruits.  Chest: Clear to ascultation bilaterally.No crackles or wheezes. Non tender to palpation  Breast: No asymetry,no masses. No nipple discharge or inversion. No axillary or supraclavicular adenopathy  Cardiovascular system; Heart sounds normal,  S1 and  S2 ,no S3.  No murmur, or thrill. Apical beat not displaced Peripheral pulses normal.  Abdomen: Soft, non tender, no organomegaly or masses. No bruits. Bowel sounds normal. No guarding, tenderness or rebound.  Rectal:  No mass. Guaiac negative stool.  GU: External genitalia  normal. No lesions. Vaginal canal normal.No discharge. Uterus normal size, no adnexal masses, no cervical motion or adnexal tenderness.  Musculoskeletal exam: Full ROM of spine, hips , shoulders and knees. No deformity ,swelling or crepitus noted. No muscle wasting or atrophy.   Neurologic: Cranial nerves 2 to 12 intact. Power, tone ,sensation and reflexes normal throughout. No disturbance in gait. No tremor.  Skin: Intact, no ulceration, erythema , scaling or rash noted. Pigmentation normal throughout  Psych; Normal mood and affect. Judgement and concentration normal        Assessment & Plan:

## 2011-10-18 DIAGNOSIS — Z0000 Encounter for general adult medical examination without abnormal findings: Secondary | ICD-10-CM | POA: Insufficient documentation

## 2011-10-18 NOTE — Assessment & Plan Note (Signed)
Controlled, no change in medication DASH diet and commitment to daily physical activity for a minimum of 30 minutes discussed and encouraged, as a part of hypertension management. The importance of attaining a healthy weight is also discussed.  

## 2011-10-18 NOTE — Assessment & Plan Note (Signed)
Deteriorated, weight gain and increased HBA1C Patient educated about the importance of limiting  Carbohydrate intake , the need to commit to daily physical activity for a minimum of 30 minutes , and to commit weight loss. The fact that changes in all these areas will reduce or eliminate all together the development of diabetes is stressed.

## 2011-10-18 NOTE — Assessment & Plan Note (Signed)
Deteriorated. Patient re-educated about  the importance of commitment to a  minimum of 150 minutes of exercise per week. The importance of healthy food choices with portion control discussed. Encouraged to start a food diary, count calories and to consider  joining a support group. Sample diet sheets offered. Goals set by the patient for the next several months.    

## 2011-10-18 NOTE — Assessment & Plan Note (Signed)
Complete exam including pelvic and breast documented. Approx 7 minutes spent counseling pt re lifestyle changes for improved health with weight loss, and commitment to regular physical activity. Stress management counseling done, and pt vented also

## 2011-10-22 ENCOUNTER — Telehealth: Payer: Self-pay | Admitting: Family Medicine

## 2011-10-22 NOTE — Telephone Encounter (Signed)
Spoke with patient.

## 2011-11-06 ENCOUNTER — Telehealth: Payer: Self-pay | Admitting: Family Medicine

## 2011-11-09 NOTE — Telephone Encounter (Signed)
Courtney spoke with patient

## 2011-11-10 ENCOUNTER — Telehealth: Payer: Self-pay | Admitting: Family Medicine

## 2011-11-10 NOTE — Telephone Encounter (Signed)
Patient aware.

## 2012-01-04 ENCOUNTER — Other Ambulatory Visit: Payer: Self-pay | Admitting: Family Medicine

## 2012-01-04 DIAGNOSIS — Z139 Encounter for screening, unspecified: Secondary | ICD-10-CM

## 2012-01-07 ENCOUNTER — Ambulatory Visit (HOSPITAL_COMMUNITY): Payer: BC Managed Care – PPO

## 2012-01-14 ENCOUNTER — Ambulatory Visit (HOSPITAL_COMMUNITY)
Admission: RE | Admit: 2012-01-14 | Discharge: 2012-01-14 | Disposition: A | Discharge status: Home / Self Care | Payer: BC Managed Care – PPO | Source: Ambulatory Visit | Attending: Family Medicine | Admitting: Family Medicine

## 2012-01-14 DIAGNOSIS — Z139 Encounter for screening, unspecified: Secondary | ICD-10-CM

## 2012-01-14 DIAGNOSIS — Z1231 Encounter for screening mammogram for malignant neoplasm of breast: Secondary | ICD-10-CM | POA: Insufficient documentation

## 2012-01-22 ENCOUNTER — Ambulatory Visit: Payer: BC Managed Care – PPO | Admitting: Family Medicine

## 2012-01-29 LAB — HEMOGLOBIN A1C
Hgb A1c MFr Bld: 6.2 % — ABNORMAL HIGH (ref <5.7)
Mean Plasma Glucose: 131 mg/dL — ABNORMAL HIGH (ref <117)

## 2012-02-02 ENCOUNTER — Encounter: Payer: Self-pay | Admitting: Family Medicine

## 2012-02-02 ENCOUNTER — Ambulatory Visit (INDEPENDENT_AMBULATORY_CARE_PROVIDER_SITE_OTHER): Payer: BC Managed Care – PPO | Admitting: Family Medicine

## 2012-02-02 VITALS — BP 128/72 | HR 86 | Resp 18 | Ht 67.0 in | Wt 189.8 lb

## 2012-02-02 DIAGNOSIS — R7301 Impaired fasting glucose: Secondary | ICD-10-CM

## 2012-02-02 DIAGNOSIS — E785 Hyperlipidemia, unspecified: Secondary | ICD-10-CM

## 2012-02-02 DIAGNOSIS — J309 Allergic rhinitis, unspecified: Secondary | ICD-10-CM

## 2012-02-02 DIAGNOSIS — E663 Overweight: Secondary | ICD-10-CM

## 2012-02-02 DIAGNOSIS — J302 Other seasonal allergic rhinitis: Secondary | ICD-10-CM

## 2012-02-02 DIAGNOSIS — I10 Essential (primary) hypertension: Secondary | ICD-10-CM

## 2012-02-02 DIAGNOSIS — E739 Lactose intolerance, unspecified: Secondary | ICD-10-CM

## 2012-02-02 NOTE — Patient Instructions (Addendum)
F/U in 4.5 month  Fasting lipid, cmp and EGFR, HBA1C, CBc, TSH and vit D  In 4.5 month  It is important that you exercise regularly at least 30 minutes 5 times a week. If you develop chest pain, have severe difficulty breathing, or feel very tired, stop exercising immediately and seek medical attention   A healthy diet is rich in fruit, vegetables and whole grains. Poultry fish, nuts and beans are a healthy choice for protein rather then red meat. A low sodium diet and drinking 64 ounces of water daily is generally recommended. Oils and sweet should be limited. Carbohydrates especially for those who are diabetic or overweight, should be limited to 30-45 gram per meal. It is important to eat on a regular schedule, at least 3 times daily. Snacks should be primarily fruits, vegetables or nuts.  No med changes at this time, refill flonase  Weight loss goal is 10 pounds

## 2012-02-02 NOTE — Progress Notes (Signed)
  Subjective:    Patient ID: Megan Villa, female    DOB: [birth year], 57 y.o.   MRN: [id number]  HPI The PT is here for follow up and re-evaluation of chronic medical conditions, medication management and review of any available recent lab and radiology data.  Preventive health is updated, specifically  Cancer screening and Immunization.   Questions or concerns regarding consultations or procedures which the PT has had in the interim are  addressed. The PT denies any adverse reactions to current medications since the last visit.  There are no new concerns.  C/o increased and uncontrolled allergy symptoms with excessive clear nasal drainage, congestion and watery eyes    Review of Systems See HPI Denies recent fever or chills. Denies sinus pressure, nasal congestion, ear pain or sore throat. Denies chest congestion, productive cough or wheezing. Denies chest pains, palpitations and leg swelling Denies abdominal pain, nausea, vomiting,diarrhea or constipation.   Denies dysuria, frequency, hesitancy or incontinence. Denies joint pain, swelling and limitation in mobility. Denies headaches, seizures, numbness, or tingling. Denies depression, anxiety or insomnia.Recently has ahd increased stress due to unexpected death of her mother in law Denies skin break down or rash.        Objective:   Physical Exam  Patient alert and oriented and in no cardiopulmonary distress.  HEENT: No facial asymmetry, EOMI, no sinus tenderness,  oropharynx pink and moist.  Neck supple no adenopathy.  Chest: Clear to auscultation bilaterally.  CVS: S1, S2 no murmurs, no S3.  ABD: Soft non tender. Bowel sounds normal.  Ext: No edema  MS: Adequate ROM spine, shoulders, hips and knees.  Skin: Intact, no ulcerations or rash noted.  Psych: Good eye contact, normal affect. Memory intact not anxious or depressed appearing.  CNS: CN 2-12 intact, power, tone and sensation normal throughout.         Assessment & Plan:

## 2012-02-05 ENCOUNTER — Telehealth: Payer: Self-pay | Admitting: Family Medicine

## 2012-02-05 MED ORDER — FLUTICASONE PROPIONATE 50 MCG/ACT NA SUSP: 2.0000 | Freq: Every day | NASAL | Status: DC

## 2012-02-05 NOTE — Telephone Encounter (Signed)
Sent In

## 2012-02-07 DIAGNOSIS — J302 Other seasonal allergic rhinitis: Secondary | ICD-10-CM | POA: Insufficient documentation

## 2012-02-07 NOTE — Assessment & Plan Note (Signed)
Controlled, no change in medication DASH diet and commitment to daily physical activity for a minimum of 30 minutes discussed and encouraged, as a part of hypertension management. The importance of attaining a healthy weight is also discussed.  

## 2012-02-07 NOTE — Assessment & Plan Note (Signed)
Improved Patient advised to reduce carb and sweets, commit to regular physical activity, take meds as prescribed, test blood as directed, and attempt to lose weight, to improve blood sugar control.  

## 2012-02-07 NOTE — Assessment & Plan Note (Signed)
Improved. Pt applauded on succesful weight loss through lifestyle change, and encouraged to continue same. Weight loss goal set for the next several months.  

## 2012-02-07 NOTE — Assessment & Plan Note (Signed)
Hyperlipidemia:Low fat diet discussed and encouraged. LDL elevated, no med change, update d labs next visit

## 2012-02-07 NOTE — Assessment & Plan Note (Signed)
Uncontrolled, with increased symptoms. Resume flonase, script sent in

## 2012-02-08 ENCOUNTER — Other Ambulatory Visit: Payer: Self-pay

## 2012-02-08 ENCOUNTER — Telehealth: Payer: Self-pay | Admitting: Family Medicine

## 2012-02-08 MED ORDER — LOVASTATIN 40 MG PO TABS: 40.0000 mg | ORAL_TABLET | Freq: Every day | ORAL | Status: DC

## 2012-02-08 NOTE — Telephone Encounter (Signed)
Med refilled.

## 2012-04-06 ENCOUNTER — Telehealth: Payer: Self-pay | Admitting: Family Medicine

## 2012-04-06 MED ORDER — POTASSIUM CHLORIDE CRYS ER 20 MEQ PO TBCR: 20.0000 meq | EXTENDED_RELEASE_TABLET | Freq: Every day | ORAL | Status: DC

## 2012-04-06 MED ORDER — HYDROCHLOROTHIAZIDE 25 MG PO TABS: 25.0000 mg | ORAL_TABLET | Freq: Every day | ORAL | Status: DC

## 2012-04-06 NOTE — Telephone Encounter (Signed)
Patients meds refilled  

## 2012-05-20 LAB — COMPLETE METABOLIC PANEL WITH GFR
ALT: 11 U/L (ref 0–35)
AST: 15 U/L (ref 0–37)
Albumin: 4.4 g/dL (ref 3.5–5.2)
Alkaline Phosphatase: 93 U/L (ref 39–117)
BUN: 12 mg/dL (ref 6–23)
CO2: 30 mEq/L (ref 19–32)
Calcium: 9.4 mg/dL (ref 8.4–10.5)
Chloride: 102 mEq/L (ref 96–112)
Creat: 0.78 mg/dL (ref 0.50–1.10)
GFR, Est African American: >89 mL/min
GFR, Est Non African American: 86 mL/min
Glucose, Bld: 104 mg/dL — ABNORMAL HIGH (ref 70–99)
Potassium: 4.3 mEq/L (ref 3.5–5.3)
Sodium: 140 mEq/L (ref 135–145)
Total Bilirubin: 0.4 mg/dL (ref 0.3–1.2)
Total Protein: 7.3 g/dL (ref 6.0–8.3)

## 2012-05-20 LAB — LIPID PANEL
Cholesterol: 208 mg/dL — ABNORMAL HIGH (ref 0–200)
HDL: 64 mg/dL (ref >39)
LDL Cholesterol: 129 mg/dL — ABNORMAL HIGH (ref 0–99)
Total CHOL/HDL Ratio: 3.3 Ratio
Triglycerides: 77 mg/dL (ref <150)
VLDL: 15 mg/dL (ref 0–40)

## 2012-05-20 LAB — CBC
HCT: 40.3 % (ref 36.0–46.0)
Hemoglobin: 12.9 g/dL (ref 12.0–15.0)
MCH: 26.1 pg (ref 26.0–34.0)
MCHC: 32 g/dL (ref 30.0–36.0)
MCV: 81.6 fL (ref 78.0–100.0)
Platelets: 426 10*3/uL — ABNORMAL HIGH (ref 150–400)
RBC: 4.94 MIL/uL (ref 3.87–5.11)
RDW: 14.7 % (ref 11.5–15.5)
WBC: 5.4 10*3/uL (ref 4.0–10.5)

## 2012-05-20 LAB — HEMOGLOBIN A1C
Hgb A1c MFr Bld: 6.1 % — ABNORMAL HIGH (ref <5.7)
Mean Plasma Glucose: 128 mg/dL — ABNORMAL HIGH (ref <117)

## 2012-05-20 LAB — TSH: TSH: 2.997 u[IU]/mL (ref 0.350–4.500)

## 2012-05-21 LAB — VITAMIN D 25 HYDROXY (VIT D DEFICIENCY, FRACTURES): Vit D, 25-Hydroxy: 44 ng/mL (ref 30–89)

## 2012-06-01 ENCOUNTER — Encounter: Payer: Self-pay | Admitting: Family Medicine

## 2012-06-01 ENCOUNTER — Ambulatory Visit (INDEPENDENT_AMBULATORY_CARE_PROVIDER_SITE_OTHER): Payer: BC Managed Care – PPO | Admitting: Family Medicine

## 2012-06-01 VITALS — BP 122/72 | HR 83 | Resp 16 | Ht 67.0 in | Wt 181.0 lb

## 2012-06-01 DIAGNOSIS — I10 Essential (primary) hypertension: Secondary | ICD-10-CM

## 2012-06-01 DIAGNOSIS — E785 Hyperlipidemia, unspecified: Secondary | ICD-10-CM

## 2012-06-01 DIAGNOSIS — E739 Lactose intolerance, unspecified: Secondary | ICD-10-CM

## 2012-06-01 DIAGNOSIS — E663 Overweight: Secondary | ICD-10-CM

## 2012-06-01 DIAGNOSIS — J309 Allergic rhinitis, unspecified: Secondary | ICD-10-CM

## 2012-06-01 DIAGNOSIS — J302 Other seasonal allergic rhinitis: Secondary | ICD-10-CM

## 2012-06-01 MED ORDER — ASPIRIN EC 81 MG PO TBEC: 81.0000 mg | DELAYED_RELEASE_TABLET | Freq: Every day | ORAL | Status: AC

## 2012-06-01 NOTE — Progress Notes (Signed)
  Subjective:    Patient ID: Megan Villa, female    DOB: Jan 27, 1955, 57 y.o.   MRN: [id number]  HPI The PT is here for follow up and re-evaluation of chronic medical conditions, medication management and review of any available recent lab and radiology data.  Preventive health is updated, specifically  Cancer screening and Immunization.   Questions or concerns regarding consultations or procedures which the PT has had in the interim are  addressed. The PT denies any adverse reactions to current medications since the last visit.  There are no new concerns.  There are no specific complaints       Review of Systems See HPI Denies recent fever or chills. Denies sinus pressure, nasal congestion, ear pain or sore throat. Denies chest congestion, productive cough or wheezing. Denies chest pains, palpitations and leg swelling Denies abdominal pain, nausea, vomiting,diarrhea or constipation.   Denies dysuria, frequency, hesitancy or incontinence. Denies joint pain, swelling and limitation in mobility. Denies headaches, seizures, numbness, or tingling. Denies depression, anxiety or insomnia. Denies skin break down or rash.        Objective:   Physical Exam  Patient alert and oriented and in no cardiopulmonary distress.  HEENT: No facial asymmetry, EOMI, no sinus tenderness,  oropharynx pink and moist.  Neck supple no adenopathy.  Chest: Clear to auscultation bilaterally.  CVS: S1, S2 no murmurs, no S3.  ABD: Soft non tender. Bowel sounds normal.  Ext: No edema  MS: Adequate ROM spine, shoulders, hips and knees.  Skin: Intact, no ulcerations or rash noted.  Psych: Good eye contact, normal affect. Memory intact not anxious or depressed appearing.  CNS: CN 2-12 intact, power, tone and sensation normal throughout.       Assessment & Plan:

## 2012-06-01 NOTE — Patient Instructions (Addendum)
F/u  With rectal end October   Reduce cheese, butter, margerine, egg yolks need to go. Cholesterol has increased  Congrats on weight loss, 8 pounds and improved blood sugar  Please start walking at least 5 days per week  Weight loss goal of 8 to 10 pounds   Fasting lipid, cmp HBa1c before October visit   Start baby aspirin 81mg  one daily for stroke risk reduction

## 2012-06-03 MED ORDER — FLUTICASONE PROPIONATE 50 MCG/ACT NA SUSP: 2.0000 | Freq: Every day | NASAL | Status: DC

## 2012-06-13 NOTE — Assessment & Plan Note (Signed)
Improved. Pt applauded on succesful weight loss through lifestyle change, and encouraged to continue same. Weight loss goal set for the next several months.  

## 2012-06-13 NOTE — Assessment & Plan Note (Signed)
Deteriorated, med compliance stressed. Hyperlipidemia:Low fat diet discussed and encouraged.

## 2012-06-13 NOTE — Assessment & Plan Note (Signed)
Improved slightly Patient educated about the importance of limiting  Carbohydrate intake , the need to commit to daily physical activity for a minimum of 30 minutes , and to commit weight loss. The fact that changes in all these areas will reduce or eliminate all together the development of diabetes is stressed.

## 2012-06-13 NOTE — Assessment & Plan Note (Signed)
Controlled, no change in medication DASH diet and commitment to daily physical activity for a minimum of 30 minutes discussed and encouraged, as a part of hypertension management. The importance of attaining a healthy weight is also discussed.  

## 2012-06-13 NOTE — Assessment & Plan Note (Signed)
Controlled, no change in medication No significant flare currently

## 2012-10-11 ENCOUNTER — Telehealth: Payer: Self-pay | Admitting: Family Medicine

## 2012-10-12 MED ORDER — POTASSIUM CHLORIDE CRYS ER 20 MEQ PO TBCR: 20.0000 meq | EXTENDED_RELEASE_TABLET | Freq: Every day | ORAL | Status: DC

## 2012-10-12 MED ORDER — HYDROCHLOROTHIAZIDE 25 MG PO TABS: 25.0000 mg | ORAL_TABLET | Freq: Every day | ORAL | Status: DC

## 2012-10-12 NOTE — Telephone Encounter (Signed)
meds sent in

## 2012-11-12 ENCOUNTER — Other Ambulatory Visit: Payer: Self-pay | Admitting: Family Medicine

## 2012-11-12 LAB — HEMOGLOBIN A1C
Hgb A1c MFr Bld: 6.3 % — ABNORMAL HIGH (ref <5.7)
Mean Plasma Glucose: 134 mg/dL — ABNORMAL HIGH (ref <117)

## 2012-11-12 LAB — LIPID PANEL
Cholesterol: 171 mg/dL (ref 0–200)
HDL: 66 mg/dL (ref >39)
LDL Cholesterol: 99 mg/dL (ref 0–99)
Total CHOL/HDL Ratio: 2.6 Ratio
Triglycerides: 31 mg/dL (ref <150)
VLDL: 6 mg/dL (ref 0–40)

## 2012-11-17 ENCOUNTER — Ambulatory Visit (HOSPITAL_COMMUNITY)
Admission: RE | Admit: 2012-11-17 | Discharge: 2012-11-17 | Disposition: A | Discharge status: Home / Self Care | Payer: BC Managed Care – PPO | Source: Ambulatory Visit | Attending: Family Medicine | Admitting: Family Medicine

## 2012-11-17 ENCOUNTER — Encounter: Payer: Self-pay | Admitting: Family Medicine

## 2012-11-17 ENCOUNTER — Ambulatory Visit (INDEPENDENT_AMBULATORY_CARE_PROVIDER_SITE_OTHER): Payer: BC Managed Care – PPO | Admitting: Family Medicine

## 2012-11-17 VITALS — BP 142/72 | HR 70 | Resp 18 | Ht 67.0 in | Wt 187.0 lb

## 2012-11-17 DIAGNOSIS — M7730 Calcaneal spur, unspecified foot: Secondary | ICD-10-CM | POA: Insufficient documentation

## 2012-11-17 DIAGNOSIS — Z6825 Body mass index (BMI) 25.0-25.9, adult: Secondary | ICD-10-CM

## 2012-11-17 DIAGNOSIS — M25579 Pain in unspecified ankle and joints of unspecified foot: Secondary | ICD-10-CM | POA: Insufficient documentation

## 2012-11-17 DIAGNOSIS — E785 Hyperlipidemia, unspecified: Secondary | ICD-10-CM

## 2012-11-17 DIAGNOSIS — I10 Essential (primary) hypertension: Secondary | ICD-10-CM

## 2012-11-17 DIAGNOSIS — M25571 Pain in right ankle and joints of right foot: Secondary | ICD-10-CM

## 2012-11-17 DIAGNOSIS — E739 Lactose intolerance, unspecified: Secondary | ICD-10-CM

## 2012-11-17 DIAGNOSIS — E663 Overweight: Secondary | ICD-10-CM

## 2012-11-17 LAB — COMPLETE METABOLIC PANEL WITH GFR
ALT: 11 U/L (ref 0–35)
AST: 18 U/L (ref 0–37)
Albumin: 4.1 g/dL (ref 3.5–5.2)
Alkaline Phosphatase: 83 U/L (ref 39–117)
BUN: 14 mg/dL (ref 6–23)
CO2: 29 mEq/L (ref 19–32)
Calcium: 9.7 mg/dL (ref 8.4–10.5)
Chloride: 107 mEq/L (ref 96–112)
Creat: 0.77 mg/dL (ref 0.50–1.10)
GFR, Est African American: >89 mL/min
GFR, Est Non African American: 87 mL/min
Glucose, Bld: 102 mg/dL — ABNORMAL HIGH (ref 70–99)
Potassium: 5.1 mEq/L (ref 3.5–5.3)
Sodium: 144 mEq/L (ref 135–145)
Total Bilirubin: 0.3 mg/dL (ref 0.3–1.2)
Total Protein: 6.8 g/dL (ref 6.0–8.3)

## 2012-11-17 MED ORDER — PREDNISONE 5 MG PO TABS: 5.0000 mg | ORAL_TABLET | Freq: Two times a day (BID) | ORAL | Status: AC

## 2012-11-17 MED ORDER — IBUPROFEN 800 MG PO TABS: ORAL_TABLET | ORAL | Status: DC

## 2012-11-17 MED ORDER — OMEPRAZOLE 20 MG PO CPDR: 20.0000 mg | DELAYED_RELEASE_CAPSULE | Freq: Every day | ORAL | Status: DC

## 2012-11-17 NOTE — Patient Instructions (Addendum)
Pelvic and breast in 4 month, call if you need me before  Blood pressure and blood sugar ar elevated today.  Commit to exercise for at least 30 MINUTES EVERY DAY  Aim for weight loss of 6 to 8 pound  Cholesterol is very good You are being treated for achilles tendinitis. Toradol 60 mg and depo medrol 80 mg Im atre administered in the office followed by 1 week of ibuprofen and prednisone and prilosec samples one  Daily for 8 days for stomach protection X ray of right ankle is also ordered  Call if symptoms persist or worsen for referral to dr Romeo Apple  Achilles Tendinitis Tendinitis a swelling and soreness of the tendon. The pain in the tendon (cord-like structure which attaches muscle to bone) is produced by tiny tears and the inflammation present in that tendon. It commonly occurs at the shoulders, heels, and elbows. It is usually caused by overusing the tendon and joint involved. Achilles tendinitis involves the Achilles tendon. This is the large tendon in the back of the leg just above the foot. It attaches the large muscles of the lower leg to the heel bone (called calcaneus).  This diagnosis (learning what is wrong) is made by examination. X-rays will be generally be normal if only tendinitis is present. HOME CARE INSTRUCTIONS   Apply ice to the injury for 15-20 minutes, 3-4 times per day. Put the ice in a plastic bag and place a towel between the bag of ice and your skin.  Try to avoid use other than gentle range of motion while the tendon is painful. Do not resume use until instructed by your caregiver. Then begin use gradually. Do not increase use to the point of pain. If pain does develop, decrease use and continue the above measures. Gradually increase activities that do not cause discomfort until you gradually achieve normal use.  Only take over-the-counter or prescription medicines for pain, discomfort, or fever as directed by your caregiver. SEEK MEDICAL CARE IF:   Your pain  and swelling increase or pain is uncontrolled with medications.  You develop new, unexplained problems (symptoms) or an increase of the symptoms that brought you to your caregiver.  You develop an inability to move your toes or foot, develop warmth and swelling in your foot, or begin running an unexplained temperature. MAKE SURE YOU:   Understand these instructions.  Will watch your condition.  Will get help right away if you are not doing well or get worse. Document Released: 10/15/2004 Document Revised: 03/30/2011 Document Reviewed: 08/24/2007 [facility] Patient Information 2014 [place], [place].   HBA1C in 4 month, just before f/u visit with non fasting chem 7

## 2012-11-18 ENCOUNTER — Other Ambulatory Visit: Payer: Self-pay

## 2012-11-18 MED ORDER — LOVASTATIN 40 MG PO TABS: 40.0000 mg | ORAL_TABLET | Freq: Every day | ORAL | Status: DC

## 2012-11-20 NOTE — Assessment & Plan Note (Signed)
Deteriorated Patient educated about the importance of limiting  Carbohydrate intake , the need to commit to daily physical activity for a minimum of 30 minutes , and to commit weight loss. The fact that changes in all these areas will reduce or eliminate all together the development of diabetes is stressed.    

## 2012-11-20 NOTE — Assessment & Plan Note (Signed)
Deteriorated. Patient re-educated about  the importance of commitment to a  minimum of 150 minutes of exercise per week. The importance of healthy food choices with portion control discussed. Encouraged to start a food diary, count calories and to consider  joining a support group. Sample diet sheets offered. Goals set by the patient for the next several months.    

## 2012-11-20 NOTE — Assessment & Plan Note (Signed)
Controlled, no change in medication DASH diet and commitment to daily physical activity for a minimum of 30 minutes discussed and encouraged, as a part of hypertension management. The importance of attaining a healthy weight is also discussed.  

## 2012-11-20 NOTE — Progress Notes (Signed)
  Subjective:    Patient ID: Megan Villa, female    DOB: 1955-07-18, 57 y.o.   MRN: [id number]  HPI The PT is here for follow up and re-evaluation of chronic medical conditions, medication management and review of any available recent lab and radiology data.  Preventive health is updated, specifically  Cancer screening and Immunization.   Questions or concerns regarding consultations or procedures which the PT has had in the interim are  addressed. The PT denies any adverse reactions to current medications since the last visit.  1 month h/o disabling pain at back of right ankle in area of achiles, no trauma aggravated the symptom Has stopped exercising, gained weight and blood suagr has increased      Review of Systems See HPI Denies recent fever or chills. Denies sinus pressure, nasal congestion, ear pain or sore throat. Denies chest congestion, productive cough or wheezing. Denies chest pains, palpitations and leg swelling Denies abdominal pain, nausea, vomiting,diarrhea or constipation.   Denies dysuria, frequency, hesitancy or incontinence. Denies headaches, seizures, numbness, or tingling. Denies depression, anxiety or insomnia. Denies skin break down or rash.        Objective:   Physical Exam  Patient alert and oriented and in no cardiopulmonary distress.  HEENT: No facial asymmetry, EOMI, no sinus tenderness,  oropharynx pink and moist.  Neck supple no adenopathy.  Chest: Clear to auscultation bilaterally.  CVS: S1, S2 no murmurs, no S3.  ABD: Soft non tender. Bowel sounds normal.  Ext: No edema  MS: Adequate ROM spine, shoulders, hips and knees.Tender over right achilees tendon, able to stand on tiptoe, no palpable rupture  Skin: Intact, no ulcerations or rash noted.  Psych: Good eye contact, normal affect. Memory intact not anxious or depressed appearing.  CNS: CN 2-12 intact, power, tone and sensation normal throughout.       Assessment & Plan:

## 2012-11-20 NOTE — Assessment & Plan Note (Signed)
Controlled, no change in medication Hyperlipidemia:Low fat diet discussed and encouraged.  \ 

## 2012-11-20 NOTE — Assessment & Plan Note (Signed)
Clinical impression of tendinitis. After discussion with pt ordered anti inflammatories in office and oral, by the time nurse went to ad minister pt changed her mind about both. Will be referred to ortho should she call back, this had been offered initially, and was unwilling due to co pay Ankle xray

## 2012-11-24 ENCOUNTER — Telehealth: Payer: Self-pay | Admitting: Family Medicine

## 2012-11-24 NOTE — Telephone Encounter (Signed)
Patient was told to go to urgent care and does not want to go she is worried about the pain in her stomach

## 2012-11-24 NOTE — Telephone Encounter (Signed)
I spoke with pt, stated she is having intermittent cramping pain up to an 8 , central abdominal  pain, still has her gall bladder I advised again that she go to urgent care or the ED , that she needs to be evaluated today,   Asked for an appointment next week, pls call her and give appointment for next week, I again made it clear that she should not "wait " to be seen here with such severe pain as she was describing

## 2012-11-25 NOTE — Telephone Encounter (Signed)
Patient has appointment 11.10.2014

## 2012-11-25 NOTE — Telephone Encounter (Signed)
Left message for patient to call back  

## 2012-11-28 ENCOUNTER — Encounter: Payer: Self-pay | Admitting: Family Medicine

## 2012-11-28 ENCOUNTER — Ambulatory Visit (INDEPENDENT_AMBULATORY_CARE_PROVIDER_SITE_OTHER): Payer: BC Managed Care – PPO | Admitting: Family Medicine

## 2012-11-28 VITALS — BP 126/70 | HR 80 | Resp 18 | Ht 67.0 in | Wt 183.1 lb

## 2012-11-28 DIAGNOSIS — E785 Hyperlipidemia, unspecified: Secondary | ICD-10-CM

## 2012-11-28 DIAGNOSIS — R109 Unspecified abdominal pain: Secondary | ICD-10-CM

## 2012-11-28 DIAGNOSIS — N814 Uterovaginal prolapse, unspecified: Secondary | ICD-10-CM

## 2012-11-28 DIAGNOSIS — K5900 Constipation, unspecified: Secondary | ICD-10-CM

## 2012-11-28 DIAGNOSIS — I10 Essential (primary) hypertension: Secondary | ICD-10-CM

## 2012-11-28 DIAGNOSIS — N3090 Cystitis, unspecified without hematuria: Secondary | ICD-10-CM

## 2012-11-28 LAB — POCT URINALYSIS DIPSTICK
Bilirubin, UA: NEGATIVE
Blood, UA: NEGATIVE
Glucose, UA: NEGATIVE
Ketones, UA: NEGATIVE
Leukocytes, UA: NEGATIVE
Nitrite, UA: NEGATIVE
Protein, UA: NEGATIVE
Spec Grav, UA: 1.02
Urobilinogen, UA: 0.2
pH, UA: 7

## 2012-11-28 MED ORDER — LOVASTATIN 40 MG PO TABS: 40.0000 mg | ORAL_TABLET | Freq: Every day | ORAL | Status: DC

## 2012-11-28 NOTE — Patient Instructions (Addendum)
F/u as before .   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 If this worsens, call and you will be referred to a stomach specialist, as we discussed.  Urine shows no sign of infection  H pylori test today, you have been treated for this in the past , this may be causing some of your abdominal gassy pains

## 2012-11-28 NOTE — Progress Notes (Signed)
  Subjective:    Patient ID: Megan Villa, female    DOB: 07-Mar-1955, 57 y.o.   MRN: [id number]  HPI 1 week h/o lower abdominal pain and pressure , also experienced in the vaginal vault. Pt also c/o straining with BM's that are hard, also urinary frequency. C/o excess flatulence and burping. Denies fever, chills or excessive vaginal d/c Denies vomiting    Review of Systems See HPI Denies recent fever or chills. Denies sinus pressure, nasal congestion, ear pain or sore throat. Denies chest congestion, productive cough or wheezing. Denies chest pains, palpitations and leg swelling  Denies joint pain, swelling and limitation in mobility. Denies headaches, seizures, numbness, or tingling. Denies depression, anxiety or insomnia. Denies skin break down or rash.        Objective:   Physical Exam  Patient alert and oriented and in no cardiopulmonary distress.Anxious  HEENT: No facial asymmetry, EOMI, no sinus tenderness,  oropharynx pink and moist.  Neck supple no adenopathy.  Chest: Clear to auscultation bilaterally.  CVS: S1, S2 no murmurs, no S3.  ABD: Soft , generalized superficial tenderness, no guarding or rebound. No organomegaly or mass. Normal BS. No renal angle tenderness, mild suprapubic tenderness  Pelvic: uterus enlarged with prolapse into the vaginal vault. No d/c  Or tenderness on exam. No palpable adnexal masses  Ext: No edema  MS: Adequate ROM spine, shoulders, hips and knees.  Skin: Intact, no ulcerations or rash noted.  Psych: Good eye contact, normal affect. Memory intact not anxious or depressed appearing.  CNS: CN 2-12 intact, power, tone and sensation normal throughout.       Assessment & Plan:

## 2012-11-29 LAB — H. PYLORI ANTIBODY, IGG: H Pylori IgG: 0.66 {ISR}

## 2012-12-11 DIAGNOSIS — R109 Unspecified abdominal pain: Secondary | ICD-10-CM | POA: Insufficient documentation

## 2012-12-11 DIAGNOSIS — N3090 Cystitis, unspecified without hematuria: Secondary | ICD-10-CM | POA: Insufficient documentation

## 2012-12-11 DIAGNOSIS — K5900 Constipation, unspecified: Secondary | ICD-10-CM | POA: Insufficient documentation

## 2012-12-11 DIAGNOSIS — N814 Uterovaginal prolapse, unspecified: Secondary | ICD-10-CM | POA: Insufficient documentation

## 2012-12-11 DIAGNOSIS — I10 Essential (primary) hypertension: Secondary | ICD-10-CM | POA: Insufficient documentation

## 2012-12-11 DIAGNOSIS — R1013 Epigastric pain: Secondary | ICD-10-CM | POA: Insufficient documentation

## 2012-12-11 NOTE — Assessment & Plan Note (Signed)
Symptomatic , with pelvic pressure and vaginal fullness, refer to gyne

## 2012-12-11 NOTE — Assessment & Plan Note (Signed)
Controlled, no change in medication  

## 2012-12-11 NOTE — Assessment & Plan Note (Signed)
Negative UA

## 2012-12-11 NOTE — Assessment & Plan Note (Signed)
UA negative Dyspepsia, flatulence and abdominal pain, check H pylori

## 2012-12-11 NOTE — Assessment & Plan Note (Signed)
Abdominal pain associated with flatulence and dyspepsia, check H pylori status

## 2012-12-11 NOTE — Assessment & Plan Note (Signed)
Controlled, no change in medication DASH diet and commitment to daily physical activity for a minimum of 30 minutes discussed and encouraged, as a part of hypertension management. The importance of attaining a healthy weight is also discussed.  

## 2012-12-11 NOTE — Assessment & Plan Note (Signed)
General advice re need to increase fiber and water intake, also fesh unprocessed fruit anfd  veg and commmit to daily exercise. Regular use of stool softenr and laxative every 3 days if needed, to maintain regular BM

## 2012-12-22 ENCOUNTER — Encounter: Payer: Self-pay | Admitting: Obstetrics & Gynecology

## 2012-12-26 ENCOUNTER — Encounter: Payer: Self-pay | Admitting: Obstetrics & Gynecology

## 2013-01-02 ENCOUNTER — Ambulatory Visit (INDEPENDENT_AMBULATORY_CARE_PROVIDER_SITE_OTHER): Payer: BC Managed Care – PPO | Admitting: Obstetrics and Gynecology

## 2013-01-02 ENCOUNTER — Encounter: Payer: Self-pay | Admitting: Obstetrics and Gynecology

## 2013-01-02 VITALS — BP 142/70 | Ht 68.0 in | Wt 184.6 lb

## 2013-01-02 DIAGNOSIS — N8111 Cystocele, midline: Secondary | ICD-10-CM

## 2013-01-02 DIAGNOSIS — Z1212 Encounter for screening for malignant neoplasm of rectum: Secondary | ICD-10-CM

## 2013-01-02 NOTE — Patient Instructions (Signed)
Return for pessary fitting.

## 2013-01-02 NOTE — Progress Notes (Signed)
Patient ID: Megan Villa, female   DOB: [birth year], 57 y.o.   MRN: [id number]  No chief complaint on file.   HPI Megan Villa is a 57 y.o. female.  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 IGT (impaired glucose tolerance)   . Obesity   . Hyperlipidemia   . Hypertension   . Constipation     Past Surgical History  Procedure Laterality Date  . Tubal ligation    . Cystectomy      left thumb   . Cystectomy      left elbow     Family History  Problem Relation Age of Onset  . Diabetes Mother   . Diabetes Father   . Hypertension Father   . Stroke Father   . Diabetes Sister   . Diabetes Brother   . Diabetes Brother   . Arthritis      family history     Social History History  Substance Use Topics  . Smoking status: Never Smoker   . Smokeless tobacco: Not on file  . Alcohol Use: No    Allergies  Allergen Reactions  . Penicillins     REACTION: In high dosage    Current Outpatient Prescriptions  Medication Sig Dispense Refill  . aspirin EC 81 MG tablet Take 1 tablet (81 mg total) by mouth daily.  150 tablet  2  . fluticasone (FLONASE) 50 MCG/ACT nasal spray Place 2 sprays into the nose daily. As needed  16 g  5  . hydrochlorothiazide (HYDRODIURIL) 25 MG tablet Take 1 tablet (25 mg total) by mouth daily. Take one tablet by mouth once daily  30 tablet  5  . ibuprofen (ADVIL,MOTRIN) 800 MG tablet One tablet twice daily for 1 week, then only as needed  30 tablet  0  . lovastatin (MEVACOR) 40 MG tablet Take 1 tablet (40 mg total) by mouth at bedtime. Take one tablet by mouth at bedtime  30 tablet  5  . potassium chloride SA (KLOR-CON M20) 20 MEQ tablet Take 1 tablet (20 mEq total) by mouth daily. Take one tablet by mouth once daily  30 tablet  5  . omeprazole (PRILOSEC) 20 MG capsule Take 1 capsule (20 mg total) by mouth daily.  8 capsule  0   No current  facility-administered medications for this visit.    Review of Systems Review of Systems  Blood pressure 142/70, height 5\' 8"  (1.727 m), weight 83.734 kg (184 lb 9.6 oz).  Physical Exam Physical Exam  Constitutional: She is oriented to person, place, and time. She appears well-developed and well-nourished.  Eyes: Pupils are equal, round, and reactive to light.  Neck: Normal range of motion.  Cardiovascular: Normal rate.   Pulmonary/Chest: Effort normal.  Abdominal: Soft. Bowel sounds are normal. She exhibits no distension and no mass.  Genitourinary: Vagina normal. Guaiac negative stool. No vaginal discharge found.  Elongate cx nearly to inroitus, with small uterus minimal descent of bladder or bowel and no rectocele, minimal cystocele.  Musculoskeletal: Normal range of motion.  Neurological: She is alert and oriented to person, place, and time.  Psychiatric: She has a normal mood and affect. Her behavior is normal. Judgment and thought content normal.    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

## 2013-01-30 ENCOUNTER — Ambulatory Visit: Payer: BC Managed Care – PPO | Admitting: Obstetrics and Gynecology

## 2013-02-07 ENCOUNTER — Ambulatory Visit: Payer: BC Managed Care – PPO | Admitting: Obstetrics and Gynecology

## 2013-02-13 ENCOUNTER — Ambulatory Visit: Payer: BC Managed Care – PPO | Admitting: Obstetrics and Gynecology

## 2013-02-15 ENCOUNTER — Encounter: Payer: Self-pay | Admitting: Obstetrics and Gynecology

## 2013-02-15 ENCOUNTER — Ambulatory Visit (INDEPENDENT_AMBULATORY_CARE_PROVIDER_SITE_OTHER): Payer: BC Managed Care – PPO | Admitting: Obstetrics and Gynecology

## 2013-02-15 VITALS — BP 120/76 | Ht 68.0 in | Wt 182.0 lb

## 2013-02-15 DIAGNOSIS — N814 Uterovaginal prolapse, unspecified: Secondary | ICD-10-CM

## 2013-02-15 DIAGNOSIS — Z4689 Encounter for fitting and adjustment of other specified devices: Secondary | ICD-10-CM

## 2013-02-15 NOTE — Patient Instructions (Addendum)
Follow up with [facility] for ring pessary. Try to insert the pessary on your own. Follow up in 2 weeks.

## 2013-02-15 NOTE — Progress Notes (Signed)
This chart was scribed by Bennett Scrapehristina Taylor, Medical Scribe, for Dr. Christin BachJohn Tasfia Vasseur on 02/15/13 at 4:02 PM. This chart was reviewed by Dr. Christin BachJohn Duru Reiger and is accurate.    [facility]  Patient name: Megan Villa MRN [id number]  Date of birth: 06-13-1955  CC & HPI:  Megan Villa is a 58 y.o. 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 Pt is sexually active. No prior diaphragm use.  ROS:  No other complaints  Pertinent History Reviewed:  Medical & Surgical Hx:  Reviewed: Significant for HTN, uterine prolapse  Medications: Reviewed & Updated - see associated section Social History: Reviewed -  reports that she has never smoked. She does not have any smokeless tobacco history on file.  Objective Findings:  Vitals: BP 120/76  Ht 5\' 8"  (1.727 m)  Wt 182 lb (82.555 kg)  BMI 27.68 kg/m2  Physical Examination: General appearance - alert, well appearing, and in no distress and oriented to person, place, and time Mental status - alert, oriented to person, place, and time, normal mood, behavior, speech, dress, motor activity, and thought processes Pelvic - VULVA: normal appearing vulva with no masses, tenderness or lesions, VAGINA: normal appearing vagina with normal color and discharge, no lesions,  2nd degree uterine prolapse with elongated cervix nearly to inroitus   Assessment & Plan:  Fitted with a ring pessary, 2.25 with support. Will order ring pessary through [facility]. Discussed insertion and removal on her own with pt.

## 2013-03-20 ENCOUNTER — Encounter: Payer: Self-pay | Admitting: Family Medicine

## 2013-03-20 ENCOUNTER — Other Ambulatory Visit: Payer: Self-pay | Admitting: Family Medicine

## 2013-03-20 ENCOUNTER — Ambulatory Visit (INDEPENDENT_AMBULATORY_CARE_PROVIDER_SITE_OTHER): Payer: BC Managed Care – PPO | Admitting: Family Medicine

## 2013-03-20 VITALS — BP 122/64 | HR 72 | Resp 18 | Ht 67.0 in | Wt 179.1 lb

## 2013-03-20 DIAGNOSIS — J302 Other seasonal allergic rhinitis: Secondary | ICD-10-CM

## 2013-03-20 DIAGNOSIS — J309 Allergic rhinitis, unspecified: Secondary | ICD-10-CM

## 2013-03-20 DIAGNOSIS — E663 Overweight: Secondary | ICD-10-CM

## 2013-03-20 DIAGNOSIS — Z1321 Encounter for screening for nutritional disorder: Secondary | ICD-10-CM

## 2013-03-20 DIAGNOSIS — Z1329 Encounter for screening for other suspected endocrine disorder: Secondary | ICD-10-CM

## 2013-03-20 DIAGNOSIS — Z1212 Encounter for screening for malignant neoplasm of rectum: Secondary | ICD-10-CM

## 2013-03-20 DIAGNOSIS — Z139 Encounter for screening, unspecified: Secondary | ICD-10-CM

## 2013-03-20 DIAGNOSIS — Z13228 Encounter for screening for other metabolic disorders: Secondary | ICD-10-CM

## 2013-03-20 DIAGNOSIS — I10 Essential (primary) hypertension: Secondary | ICD-10-CM

## 2013-03-20 DIAGNOSIS — Z1211 Encounter for screening for malignant neoplasm of colon: Secondary | ICD-10-CM

## 2013-03-20 DIAGNOSIS — E785 Hyperlipidemia, unspecified: Secondary | ICD-10-CM

## 2013-03-20 DIAGNOSIS — E739 Lactose intolerance, unspecified: Secondary | ICD-10-CM

## 2013-03-20 DIAGNOSIS — E8881 Metabolic syndrome: Secondary | ICD-10-CM

## 2013-03-20 DIAGNOSIS — Z130 Encounter for screening for diseases of the blood and blood-forming organs and certain disorders involving the immune mechanism: Secondary | ICD-10-CM

## 2013-03-20 LAB — POC HEMOCCULT BLD/STL (OFFICE/1-CARD/DIAGNOSTIC): Fecal Occult Blood, POC: NEGATIVE

## 2013-03-20 NOTE — Patient Instructions (Signed)
F/u in early October,m call if you need me before  CONGRATS on 10 pound weight loss in 1 year  Please call and schedule your mammogram it is past due  Rectal today  Fasting lipid, cm,p and EGFr , hBA1c March 25 or after  CBC fasting lipid, cmp and EGFr, hBA1c, TSH and vit D  End September before visit, 3 to 5 days  It is important that you exercise regularly at least 30 minutes 5 times a week. If you develop chest pain, have severe difficulty breathing, or feel very tired, stop exercising immediately and seek medical attention    A healthy diet is rich in fruit, vegetables and whole grains. Poultry fish, nuts and beans are a healthy choice for protein rather then red meat. A low sodium diet and drinking 64 ounces of water daily is generally recommended. Oils and sweet should be limited. Carbohydrates especially for those who are diabetic or overweight, should be limited to 34-45 gram per meal. It is important to eat on a regular schedule, at least 3 times daily. Snacks should be primarily fruits, vegetables or nuts.

## 2013-03-27 ENCOUNTER — Ambulatory Visit (HOSPITAL_COMMUNITY)
Admission: RE | Admit: 2013-03-27 | Discharge: 2013-03-27 | Disposition: A | Discharge status: Home / Self Care | Payer: BC Managed Care – PPO | Source: Ambulatory Visit | Attending: Family Medicine | Admitting: Family Medicine

## 2013-03-27 DIAGNOSIS — Z1231 Encounter for screening mammogram for malignant neoplasm of breast: Secondary | ICD-10-CM | POA: Insufficient documentation

## 2013-03-27 DIAGNOSIS — Z139 Encounter for screening, unspecified: Secondary | ICD-10-CM

## 2013-04-10 ENCOUNTER — Telehealth: Payer: Self-pay | Admitting: Family Medicine

## 2013-04-10 MED ORDER — POTASSIUM CHLORIDE CRYS ER 20 MEQ PO TBCR: 20.0000 meq | EXTENDED_RELEASE_TABLET | Freq: Every day | ORAL | Status: DC

## 2013-04-10 NOTE — Telephone Encounter (Signed)
Med sent to [facility] per request

## 2013-04-20 ENCOUNTER — Telehealth: Payer: Self-pay | Admitting: Family Medicine

## 2013-04-20 NOTE — Telephone Encounter (Signed)
Pt aware will fax next door

## 2013-04-20 NOTE — Telephone Encounter (Signed)
Patient aware will fax to the lab

## 2013-04-21 LAB — CBC
HCT: 39.6 % (ref 36.0–46.0)
Hemoglobin: 13 g/dL (ref 12.0–15.0)
MCH: 26.5 pg (ref 26.0–34.0)
MCHC: 32.8 g/dL (ref 30.0–36.0)
MCV: 80.8 fL (ref 78.0–100.0)
Platelets: 332 10*3/uL (ref 150–400)
RBC: 4.9 MIL/uL (ref 3.87–5.11)
RDW: 15 % (ref 11.5–15.5)
WBC: 4.3 10*3/uL (ref 4.0–10.5)

## 2013-04-21 LAB — COMPLETE METABOLIC PANEL WITH GFR
ALT: 10 U/L (ref 0–35)
AST: 18 U/L (ref 0–37)
Albumin: 4.2 g/dL (ref 3.5–5.2)
Alkaline Phosphatase: 78 U/L (ref 39–117)
BUN: 12 mg/dL (ref 6–23)
CO2: 27 mEq/L (ref 19–32)
Calcium: 9.2 mg/dL (ref 8.4–10.5)
Chloride: 98 mEq/L (ref 96–112)
Creat: 0.72 mg/dL (ref 0.50–1.10)
GFR, Est African American: >89 mL/min
GFR, Est Non African American: >89 mL/min
Glucose, Bld: 104 mg/dL — ABNORMAL HIGH (ref 70–99)
Potassium: 3.7 mEq/L (ref 3.5–5.3)
Sodium: 134 mEq/L — ABNORMAL LOW (ref 135–145)
Total Bilirubin: 0.3 mg/dL (ref 0.2–1.2)
Total Protein: 6.8 g/dL (ref 6.0–8.3)

## 2013-04-21 LAB — LIPID PANEL
Cholesterol: 181 mg/dL (ref 0–200)
HDL: 71 mg/dL (ref >39)
LDL Cholesterol: 102 mg/dL — ABNORMAL HIGH (ref 0–99)
Total CHOL/HDL Ratio: 2.5 Ratio
Triglycerides: 40 mg/dL (ref <150)
VLDL: 8 mg/dL (ref 0–40)

## 2013-04-21 LAB — TSH: TSH: 2.735 u[IU]/mL (ref 0.350–4.500)

## 2013-04-21 LAB — HEMOGLOBIN A1C
Hgb A1c MFr Bld: 6.1 % — ABNORMAL HIGH (ref <5.7)
Mean Plasma Glucose: 128 mg/dL — ABNORMAL HIGH (ref <117)

## 2013-04-22 LAB — VITAMIN D 25 HYDROXY (VIT D DEFICIENCY, FRACTURES): Vit D, 25-Hydroxy: 37 ng/mL (ref 30–89)

## 2013-04-24 ENCOUNTER — Telehealth: Payer: Self-pay | Admitting: Family Medicine

## 2013-04-24 ENCOUNTER — Other Ambulatory Visit: Payer: Self-pay | Admitting: Family Medicine

## 2013-04-24 MED ORDER — HYDROCHLOROTHIAZIDE 25 MG PO TABS: ORAL_TABLET | ORAL | Status: DC

## 2013-04-24 NOTE — Telephone Encounter (Signed)
Med refilled.

## 2013-05-12 ENCOUNTER — Other Ambulatory Visit: Payer: Self-pay

## 2013-05-12 ENCOUNTER — Telehealth: Payer: Self-pay

## 2013-05-12 MED ORDER — HYDROCHLOROTHIAZIDE 25 MG PO TABS: ORAL_TABLET | ORAL | Status: DC

## 2013-05-12 NOTE — Telephone Encounter (Signed)
Med refilled.

## 2013-06-25 DIAGNOSIS — E8881 Metabolic syndrome: Secondary | ICD-10-CM

## 2013-06-25 HISTORY — DX: Metabolic syndrome: E88.810

## 2013-06-25 HISTORY — DX: Metabolic syndrome: E88.81

## 2013-06-25 NOTE — Progress Notes (Signed)
Subjective:    Patient ID: Megan Villa, female    DOB: 08-31-1955, 58 y.o.   MRN: [id number]  HPI The PT is here for follow up and re-evaluation of chronic medical conditions, medication management and review of any available recent lab and radiology data.  Preventive health is updated, specifically  Cancer screening and Immunization.   Questions or concerns regarding consultations or procedures which the PT has had in the interim are  Addressed.Seeing gyne re proplapse The PT denies any adverse reactions to current medications since the last visit.  There are no new concerns.  There are no specific complaints       Review of Systems See HPI Denies recent fever or chills. Denies sinus pressure, nasal congestion, ear pain or sore throat.Incrased allergy symptoms in past month Denies chest congestion, productive cough or wheezing. Denies chest pains, palpitations and leg swelling Denies abdominal pain, nausea, vomiting,diarrhea or constipation.   Denies dysuria, frequency, hesitancy or incontinence. Denies joint pain, swelling and limitation in mobility. Denies headaches, seizures, numbness, or tingling. Denies depression, anxiety or insomnia. Denies skin break down or rash.        Objective:   Physical Exam  BP 122/64  Pulse 72  Resp 18  Ht 5\' 7"  (1.702 m)  Wt 179 lb 1.9 oz (81.248 kg)  BMI 28.05 kg/m2  SpO2 97% Patient alert and oriented and in no cardiopulmonary distress.  HEENT: No facial asymmetry, EOMI,   oropharynx pink and moist.  Neck supple no JVD, no mass.  Chest: Clear to auscultation bilaterally.  CVS: S1, S2 no murmurs, no S3.  ABD: Soft non tender.  Rectal: good sphincter tone, no mass or hemorrhoid, heme negative stool, no perianal rash Ext: No edema  MS: Adequate ROM spine, shoulders, hips and knees.  Skin: Intact, no ulcerations or rash noted.  Psych: Good eye contact, normal affect. Memory intact not anxious or depressed  appearing.  CNS: CN 2-12 intact, power,  normal throughout.no focal deficits noted.       Assessment & Plan:  HYPERTENSION Controlled, no change in medication DASH diet and commitment to daily physical activity for a minimum of 30 minutes discussed and encouraged, as a part of hypertension management. The importance of attaining a healthy weight is also discussed.   HYPERLIPIDEMIA lDL slightly elevated Hyperlipidemia:Low fat diet discussed and encouraged.  Updated lab needed at/ before next visit. No med change at this time  Overweight (BMI 25.0-29.9) Improved. Pt applauded on succesful weight loss through lifestyle change, and encouraged to continue same. Weight loss goal set for the next several months.   IMPAIRED GLUCOSE TOLERANCE Improved, pt applauded on this and encouraged to continue same Patient educated about the importance of limiting  Carbohydrate intake , the need to commit to daily physical activity for a minimum of 30 minutes , and to commit weight loss. The fact that changes in all these areas will reduce or eliminate all together the development of diabetes is stressed.     Seasonal allergies Increased since season change, pt to use fl;onase daily  Metabolic syndrome X The increased risk of cardiovascular disease associated with this diagnosis, and the need to consistently work on lifestyle to change this is discussed. Following  a  heart healthy diet ,commitment to 30 minutes of exercise at least 5 days per week, as well as control of blood sugar and cholesterol , and achieving a healthy weight are all the areas to be addressed .

## 2013-06-25 NOTE — Assessment & Plan Note (Signed)
Controlled, no change in medication DASH diet and commitment to daily physical activity for a minimum of 30 minutes discussed and encouraged, as a part of hypertension management. The importance of attaining a healthy weight is also discussed.  

## 2013-06-25 NOTE — Assessment & Plan Note (Signed)
Improved, pt applauded on this and encouraged to continue same Patient educated about the importance of limiting  Carbohydrate intake , the need to commit to daily physical activity for a minimum of 30 minutes , and to commit weight loss. The fact that changes in all these areas will reduce or eliminate all together the development of diabetes is stressed.    

## 2013-06-25 NOTE — Assessment & Plan Note (Signed)
Increased since season change, pt to use fl;onase daily

## 2013-06-25 NOTE — Assessment & Plan Note (Signed)
lDL slightly elevated Hyperlipidemia:Low fat diet discussed and encouraged.  Updated lab needed at/ before next visit. No med change at this time

## 2013-06-25 NOTE — Assessment & Plan Note (Signed)
Improved. Pt applauded on succesful weight loss through lifestyle change, and encouraged to continue same. Weight loss goal set for the next several months.  

## 2013-06-25 NOTE — Assessment & Plan Note (Signed)
The increased risk of cardiovascular disease associated with this diagnosis, and the need to consistently work on lifestyle to change this is discussed. Following  a  heart healthy diet ,commitment to 30 minutes of exercise at least 5 days per week, as well as control of blood sugar and cholesterol , and achieving a healthy weight are all the areas to be addressed .  

## 2013-07-03 ENCOUNTER — Telehealth: Payer: Self-pay

## 2013-07-03 ENCOUNTER — Other Ambulatory Visit: Payer: Self-pay

## 2013-07-03 MED ORDER — LOVASTATIN 40 MG PO TABS: 40.0000 mg | ORAL_TABLET | Freq: Every day | ORAL | Status: DC

## 2013-07-03 NOTE — Telephone Encounter (Signed)
Med refilled.

## 2013-07-23 ENCOUNTER — Other Ambulatory Visit: Payer: Self-pay | Admitting: Family Medicine

## 2013-07-24 NOTE — Telephone Encounter (Signed)
Pt called wanting a nasal spray called into the [facility]

## 2013-10-02 ENCOUNTER — Telehealth: Payer: Self-pay | Admitting: Family Medicine

## 2013-10-02 MED ORDER — POTASSIUM CHLORIDE CRYS ER 20 MEQ PO TBCR: 20.0000 meq | EXTENDED_RELEASE_TABLET | Freq: Every day | ORAL | Status: DC

## 2013-10-02 NOTE — Telephone Encounter (Signed)
Med sent in.

## 2013-10-16 ENCOUNTER — Telehealth: Payer: Self-pay | Admitting: Family Medicine

## 2013-10-16 MED ORDER — HYDROCHLOROTHIAZIDE 25 MG PO TABS: ORAL_TABLET | ORAL | Status: DC

## 2013-10-16 NOTE — Telephone Encounter (Signed)
2 refills sent in until patient comes in for Oct appt

## 2013-10-21 LAB — LIPID PANEL
Cholesterol: 188 mg/dL (ref 0–200)
HDL: 73 mg/dL (ref >39)
LDL Cholesterol: 106 mg/dL — ABNORMAL HIGH (ref 0–99)
Total CHOL/HDL Ratio: 2.6 Ratio
Triglycerides: 43 mg/dL (ref <150)
VLDL: 9 mg/dL (ref 0–40)

## 2013-10-21 LAB — HEMOGLOBIN A1C
Hgb A1c MFr Bld: 6.3 % — ABNORMAL HIGH (ref <5.7)
Mean Plasma Glucose: 134 mg/dL — ABNORMAL HIGH (ref <117)

## 2013-10-21 LAB — COMPLETE METABOLIC PANEL WITH GFR
ALT: 12 U/L (ref 0–35)
AST: 18 U/L (ref 0–37)
Albumin: 4.2 g/dL (ref 3.5–5.2)
Alkaline Phosphatase: 75 U/L (ref 39–117)
BUN: 13 mg/dL (ref 6–23)
CO2: 28 mEq/L (ref 19–32)
Calcium: 9.8 mg/dL (ref 8.4–10.5)
Chloride: 106 mEq/L (ref 96–112)
Creat: 0.72 mg/dL (ref 0.50–1.10)
GFR, Est African American: >89 mL/min
GFR, Est Non African American: >89 mL/min
Glucose, Bld: 98 mg/dL (ref 70–99)
Potassium: 4.9 mEq/L (ref 3.5–5.3)
Sodium: 141 mEq/L (ref 135–145)
Total Bilirubin: 0.4 mg/dL (ref 0.2–1.2)
Total Protein: 6.8 g/dL (ref 6.0–8.3)

## 2013-10-25 ENCOUNTER — Ambulatory Visit (INDEPENDENT_AMBULATORY_CARE_PROVIDER_SITE_OTHER): Payer: BC Managed Care – PPO | Admitting: Family Medicine

## 2013-10-25 ENCOUNTER — Encounter: Payer: Self-pay | Admitting: Family Medicine

## 2013-10-25 VITALS — BP 120/72 | HR 82 | Resp 16 | Ht 67.0 in | Wt 181.4 lb

## 2013-10-25 DIAGNOSIS — E8881 Metabolic syndrome: Secondary | ICD-10-CM

## 2013-10-25 DIAGNOSIS — E663 Overweight: Secondary | ICD-10-CM

## 2013-10-25 DIAGNOSIS — E739 Lactose intolerance, unspecified: Secondary | ICD-10-CM

## 2013-10-25 DIAGNOSIS — J302 Other seasonal allergic rhinitis: Secondary | ICD-10-CM

## 2013-10-25 DIAGNOSIS — E785 Hyperlipidemia, unspecified: Secondary | ICD-10-CM

## 2013-10-25 DIAGNOSIS — H6123 Impacted cerumen, bilateral: Secondary | ICD-10-CM

## 2013-10-25 DIAGNOSIS — I10 Essential (primary) hypertension: Secondary | ICD-10-CM

## 2013-10-25 NOTE — Patient Instructions (Addendum)
F/u in April 10 approx, call if you need me before  No change in meds, reduce ginger snaps, ice cream etc , and continue your GREAT attitude, you are doing VERY well!!  It is important that you exercise regularly at least 30 minutes 5 times a week. If you develop chest pain, have severe difficulty breathing, or feel very tired, stop exercising immediately and seek medical attention  A healthy diet is rich in fruit, vegetables and whole grains. Poultry fish, nuts and beans are a healthy choice for protein rather then red meat. A low sodium diet and drinking 64 ounces of water daily is generally recommended. Oils and sweet should be limited. Carbohydrates especially for those who are diabetic or overweight, should be limited to 45 to 60 gram per meal. It is important to eat on a regular schedule, at least 3 times daily. Snacks should be primarily fruits, vegetables or nuts.     HBa1C, fasting lipid, cmp, cBc, tSH April 4 or after  Need to start aspirin 81 mg daily for stroke risk reduction  You are referred to ENT for bilateral ear irrigation

## 2013-10-26 ENCOUNTER — Ambulatory Visit: Payer: BC Managed Care – PPO | Admitting: Family Medicine

## 2013-10-26 DIAGNOSIS — H6123 Impacted cerumen, bilateral: Secondary | ICD-10-CM | POA: Insufficient documentation

## 2013-10-26 NOTE — Assessment & Plan Note (Signed)
Unchanged. Patient re-educated about  the importance of commitment to a  minimum of 150 minutes of exercise per week. The importance of healthy food choices with portion control discussed. Encouraged to start a food diary, count calories and to consider  joining a support group. Sample diet sheets offered. Goals set by the patient for the next several months.    

## 2013-10-26 NOTE — Assessment & Plan Note (Signed)
symptomatic and imbalance , with ear fullness. Pt has very narrow external ear canals, refer to ENT for irrigation, have not been successful in the past doing it in this office

## 2013-10-26 NOTE — Progress Notes (Signed)
Subjective:    Patient ID: Megan Villa, female    DOB: Jul 08, 1955, 58 y.o.   MRN: [id number]  HPI The PT is here for follow up and re-evaluation of chronic medical conditions, medication management and review of any available recent lab and radiology data.  Preventive health is updated, specifically  Cancer screening and Immunization.   . The PT c/o bilateral ear pressure/ fullness with some imbalance in the past 1 month. 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 Denies sinus pressure, nasal congestion,  or sore throat. Denies chest congestion, productive cough or wheezing. Denies chest pains, palpitations and leg swelling Denies abdominal pain, nausea, vomiting,diarrhea or constipation.   Denies dysuria, frequency, hesitancy or incontinence. Denies joint pain, swelling and limitation in mobility. Denies headaches, seizures, numbness, or tingling. Denies uncontrolled  Depression, does have stress and  Anxiety, denies insomnia Denies skin break down or rash.        Objective:   Physical Exam  BP 120/72  Pulse 82  Resp 16  Ht 5\' 7"  (1.702 m)  Wt 181 lb 6.4 oz (82.283 kg)  BMI 28.40 kg/m2  SpO2 99% Patient alert and oriented and in no cardiopulmonary distress.  HEENT: No facial asymmetry, EOMI,   oropharynx pink and moist.  Neck supple no JVD, no mass. bilateral cerumen impaction, no  Sinus tenderness Chest: Clear to auscultation bilaterally.  CVS: S1, S2 no murmurs, no S3.Regular rate.  ABD: Soft non tender.   Ext: No edema  MS: Adequate ROM spine, shoulders, hips and knees.  Skin: Intact, no ulcerations or rash noted.  Psych: Good eye contact, normal affect.  Memory intact not anxious or depressed appearing.  CNS: CN 2-12 intact, power,  normal throughout.no focal deficits noted.       Assessment & Plan:  Seasonal allergies Controlled, no change in medication   IMPAIRED GLUCOSE TOLERANCE Deteriorated Patient educated about the importance of limiting  Carbohydrate intake , the need to commit to daily physical activity for a minimum of 30 minutes , and to commit weight loss. The fact that changes in all these areas will reduce or eliminate all together the development of diabetes is stressed.     Hyperlipidemia LDL goal <100 Deteriorated Hyperlipidemia:Low fat diet discussed and encouraged.  No change in med dose Updated lab needed at/ before next visit.   Overweight (BMI 25.0-29.9) Unchanged. Patient re-educated about  the importance of commitment to a  minimum of 150 minutes of exercise per week. The importance of healthy food choices with portion control discussed. Encouraged to start a food diary, count calories and to consider  joining a support group. Sample diet sheets offered. Goals set by the patient for the next several months.     Metabolic syndrome X The increased risk of cardiovascular disease associated with this diagnosis, and the need to consistently work on lifestyle to change this is discussed. Following  a  heart healthy diet ,commitment to 30 minutes of exercise at least 5 days per week, as well as control of blood sugar and cholesterol , and achieving a healthy weight are all the areas to be addressed .   Bilateral impacted cerumen symptomatic and  imbalance , with ear fullness. Pt has very narrow external ear canals, refer to ENT for irrigation, have not been successful in the past doing it in this office

## 2013-10-26 NOTE — Assessment & Plan Note (Signed)
Deteriorated Hyperlipidemia:Low fat diet discussed and encouraged.  No change in med dose Updated lab needed at/ before next visit.

## 2013-10-26 NOTE — Assessment & Plan Note (Signed)
Controlled, no change in medication  

## 2013-10-26 NOTE — Assessment & Plan Note (Signed)
Deteriorated Patient educated about the importance of limiting  Carbohydrate intake , the need to commit to daily physical activity for a minimum of 30 minutes , and to commit weight loss. The fact that changes in all these areas will reduce or eliminate all together the development of diabetes is stressed.    

## 2013-10-26 NOTE — Assessment & Plan Note (Signed)
The increased risk of cardiovascular disease associated with this diagnosis, and the need to consistently work on lifestyle to change this is discussed. Following  a  heart healthy diet ,commitment to 30 minutes of exercise at least 5 days per week, as well as control of blood sugar and cholesterol , and achieving a healthy weight are all the areas to be addressed .  

## 2013-10-31 ENCOUNTER — Other Ambulatory Visit: Payer: Self-pay

## 2013-10-31 MED ORDER — FLUTICASONE PROPIONATE 50 MCG/ACT NA SUSP: NASAL | Status: DC

## 2013-10-31 MED ORDER — HYDROCHLOROTHIAZIDE 25 MG PO TABS: ORAL_TABLET | ORAL | Status: DC

## 2014-01-26 ENCOUNTER — Telehealth: Payer: Self-pay | Admitting: Family Medicine

## 2014-01-26 MED ORDER — LOVASTATIN 40 MG PO TABS: 40.0000 mg | ORAL_TABLET | Freq: Every day | ORAL | Status: DC

## 2014-01-26 NOTE — Telephone Encounter (Signed)
Med sent.

## 2014-02-21 ENCOUNTER — Other Ambulatory Visit: Payer: Self-pay | Admitting: Family Medicine

## 2014-02-21 DIAGNOSIS — Z1231 Encounter for screening mammogram for malignant neoplasm of breast: Secondary | ICD-10-CM

## 2014-03-26 ENCOUNTER — Ambulatory Visit (HOSPITAL_COMMUNITY)
Admission: RE | Admit: 2014-03-26 | Discharge: 2014-03-26 | Disposition: A | Discharge status: Home / Self Care | Payer: 59 | Source: Ambulatory Visit | Attending: Family Medicine | Admitting: Family Medicine

## 2014-03-26 DIAGNOSIS — Z1231 Encounter for screening mammogram for malignant neoplasm of breast: Secondary | ICD-10-CM | POA: Insufficient documentation

## 2014-04-05 ENCOUNTER — Telehealth: Payer: Self-pay | Admitting: Family Medicine

## 2014-04-05 MED ORDER — POTASSIUM CHLORIDE CRYS ER 20 MEQ PO TBCR: 20.0000 meq | EXTENDED_RELEASE_TABLET | Freq: Every day | ORAL | Status: DC

## 2014-04-05 NOTE — Telephone Encounter (Signed)
med sent with message that patient needs appt

## 2014-04-05 NOTE — Telephone Encounter (Signed)
Patient is aware of this appointment

## 2014-04-27 ENCOUNTER — Other Ambulatory Visit: Payer: Self-pay

## 2014-04-27 DIAGNOSIS — E739 Lactose intolerance, unspecified: Secondary | ICD-10-CM

## 2014-04-27 DIAGNOSIS — E785 Hyperlipidemia, unspecified: Secondary | ICD-10-CM

## 2014-04-27 DIAGNOSIS — I10 Essential (primary) hypertension: Secondary | ICD-10-CM

## 2014-04-27 LAB — COMPREHENSIVE METABOLIC PANEL
ALT: 12 U/L (ref 0–35)
AST: 19 U/L (ref 0–37)
Albumin: 4.2 g/dL (ref 3.5–5.2)
Alkaline Phosphatase: 79 U/L (ref 39–117)
BUN: 13 mg/dL (ref 6–23)
CO2: 30 mEq/L (ref 19–32)
Calcium: 9.5 mg/dL (ref 8.4–10.5)
Chloride: 104 mEq/L (ref 96–112)
Creat: 0.66 mg/dL (ref 0.50–1.10)
Glucose, Bld: 95 mg/dL (ref 70–99)
Potassium: 4.1 mEq/L (ref 3.5–5.3)
Sodium: 141 mEq/L (ref 135–145)
Total Bilirubin: 0.5 mg/dL (ref 0.2–1.2)
Total Protein: 6.8 g/dL (ref 6.0–8.3)

## 2014-04-27 LAB — TSH: TSH: 3.285 u[IU]/mL (ref 0.350–4.500)

## 2014-04-27 LAB — LIPID PANEL
Cholesterol: 192 mg/dL (ref 0–200)
HDL: 75 mg/dL (ref >=46)
LDL Cholesterol: 107 mg/dL — ABNORMAL HIGH (ref 0–99)
Total CHOL/HDL Ratio: 2.6 Ratio
Triglycerides: 48 mg/dL (ref <150)
VLDL: 10 mg/dL (ref 0–40)

## 2014-04-28 LAB — HEMOGLOBIN A1C
Hgb A1c MFr Bld: 6.4 % — ABNORMAL HIGH (ref <5.7)
Mean Plasma Glucose: 137 mg/dL — ABNORMAL HIGH (ref <117)

## 2014-05-01 ENCOUNTER — Ambulatory Visit (INDEPENDENT_AMBULATORY_CARE_PROVIDER_SITE_OTHER): Payer: 59 | Admitting: Family Medicine

## 2014-05-01 ENCOUNTER — Encounter: Payer: Self-pay | Admitting: Family Medicine

## 2014-05-01 VITALS — BP 126/74 | HR 100 | Resp 18 | Ht 67.0 in | Wt 174.0 lb

## 2014-05-01 DIAGNOSIS — E663 Overweight: Secondary | ICD-10-CM

## 2014-05-01 DIAGNOSIS — I10 Essential (primary) hypertension: Secondary | ICD-10-CM

## 2014-05-01 DIAGNOSIS — E739 Lactose intolerance, unspecified: Secondary | ICD-10-CM | POA: Diagnosis not present

## 2014-05-01 DIAGNOSIS — J302 Other seasonal allergic rhinitis: Secondary | ICD-10-CM

## 2014-05-01 DIAGNOSIS — E785 Hyperlipidemia, unspecified: Secondary | ICD-10-CM | POA: Diagnosis not present

## 2014-05-01 DIAGNOSIS — H6123 Impacted cerumen, bilateral: Secondary | ICD-10-CM

## 2014-05-01 MED ORDER — POTASSIUM CHLORIDE CRYS ER 20 MEQ PO TBCR: 20.0000 meq | EXTENDED_RELEASE_TABLET | Freq: Every day | ORAL | Status: DC

## 2014-05-01 MED ORDER — HYDROCHLOROTHIAZIDE 25 MG PO TABS: ORAL_TABLET | ORAL | Status: DC

## 2014-05-01 MED ORDER — FLUTICASONE PROPIONATE 50 MCG/ACT NA SUSP: 2.0000 | Freq: Every day | NASAL | Status: DC

## 2014-05-01 MED ORDER — LOVASTATIN 40 MG PO TABS: 40.0000 mg | ORAL_TABLET | Freq: Every day | ORAL | Status: DC

## 2014-05-01 NOTE — Assessment & Plan Note (Signed)
Increased symptoms with spring, resume daily flonase, med prescribed

## 2014-05-01 NOTE — Assessment & Plan Note (Signed)
Unchanged, no med change though not at goal Hyperlipidemia:Low fat diet discussed and encouraged.   Lipid Panel  Lab Results  Component Value Date   CHOL 192 04/27/2014   HDL 75 04/27/2014   LDLCALC 107* 04/27/2014   TRIG 48 04/27/2014   CHOLHDL 2.6 04/27/2014      Updated lab needed at/ before next visit.

## 2014-05-01 NOTE — Assessment & Plan Note (Signed)
Controlled, no change in medication DASH diet and commitment to daily physical activity for a minimum of 30 minutes discussed and encouraged, as a part of hypertension management. The importance of attaining a healthy weight is also discussed.  BP/Weight 05/01/2014 10/25/2013 03/20/2013 02/15/2013 01/02/2013 11/28/2012 11/17/2012  Systolic BP 126 120 122 120 142 126 142  Diastolic BP 74 72 64 76 70 70 72  Wt. (Lbs) 174 181.4 179.12 182 184.6 183.12 187  BMI 27.25 28.4 28.05 27.68 28.07 28.67 29.28

## 2014-05-01 NOTE — Patient Instructions (Signed)
F/u in 5 month with rectal, please call if you need me before  Blood sugar and "bad cholesterol" have increased slightly, please make changes to your eating habits aswe discussed  Flonase is sent for your allergies  You will benefit from the ear flush as you have impacted cerumen, right ear moreso than left  It is important that you exercise regularly at least 30 minutes 5 times a week. If you develop chest pain, have severe difficulty breathing, or feel very tired, stop exercising immediately and seek medical attention   A healthy diet is rich in fruit, vegetables and whole grains. Poultry fish, nuts and beans are a healthy choice for protein rather then red meat. A low sodium diet and drinking 64 ounces of water daily is generally recommended. Oils and sweet should be limited. Carbohydrates especially for those who are diabetic or overweight, should be limited to 60-45 gram per meal. It is important to eat on a regular schedule, at least 3 times daily. Snacks should be primarily fruits, vegetables or nuts.  Fasting labs in 5 months, CBC, lipid, cmp and EGFr and HBA1C  Thanks for choosing [facility], we consider it a privelige to serve you.

## 2014-05-01 NOTE — Assessment & Plan Note (Signed)
Upcoming appt with ENT for irrigation in next approx 2 weeks

## 2014-05-01 NOTE — Assessment & Plan Note (Signed)
Deteriorated, needs to take a more disciplined approach to her food choice and eat on a regular schedule  Patient educated about the importance of limiting  Carbohydrate intake , the need to commit to daily physical activity for a minimum of 30 minutes , and to commit weight loss. The fact that changes in all these areas will reduce or eliminate all together the development of diabetes is stressed.   Diabetic Labs Latest Ref Rng 04/27/2014 10/21/2013 04/21/2013 11/12/2012 05/20/2012  HbA1c <5.7 % 6.4(H) 6.3(H) 6.1(H) 6.3(H) 6.1(H)  Chol 0 - 200 mg/dL 811192 [id number] [id number] [id number] 213(Y208(H)  HDL >=46 mg/dL 75 73 71 66 64  Calc LDL 0 - 99 mg/dL 865(H107(H) 846(N106(H) 629(B102(H) 99 129(H)  Triglycerides <150 mg/dL 48 43 40 31 77  Creatinine 0.50 - 1.10 mg/dL 2.840.66 1.320.72 4.400.72 1.020.77 7.250.78   BP/Weight 05/01/2014 10/25/2013 03/20/2013 02/15/2013 01/02/2013 11/28/2012 11/17/2012  Systolic BP 126 120 122 120 142 126 142  Diastolic BP 74 72 64 76 70 70 72  Wt. (Lbs) 174 181.4 179.12 182 184.6 183.12 187  BMI 27.25 28.4 28.05 27.68 28.07 28.67 29.28   No flowsheet data found.

## 2014-05-01 NOTE — Assessment & Plan Note (Signed)
Improved. Pt applauded on succesful weight loss through lifestyle change, and encouraged to continue same. Weight loss goal set for the next several months.  

## 2014-05-01 NOTE — Progress Notes (Signed)
Subjective:    Patient ID: Megan Villa, female    DOB: Jun 22, 1955, 59 y.o.   MRN: [id number]  HPI    Megan Villa     MRN: [id number]      DOB: 1955/07/24   HPI Megan Villa is here for follow up and re-evaluation of chronic medical conditions, medication management and review of any available recent lab and radiology data.  Preventive health is updated, specifically  Cancer screening and Immunization.   The PT denies any adverse reactions to current medications since the last visit.  C/o increased anxiety and stress on the job at times, overall enjoys her job,    ROS Denies recent fever or chills. Denies sinus pressure, nasal congestion, ear pain or sore throat. Denies chest congestion, productive cough or wheezing. Denies chest pains, palpitations and leg swelling Denies abdominal pain, nausea, vomiting,diarrhea or constipation.   Denies dysuria, frequency, hesitancy or incontinence. Denies joint pain, swelling and limitation in mobility. Denies headaches, seizures, numbness, or tingling. Denies depression, or insomnia.c/o increased anxiety at times on the job, generally works through her lunch, and states she knows that her eating habits are  Not good, and that she needs to work on this esp as blood sugar has worsend Denies skin break down or rash.   PE  BP 126/74 mmHg  Pulse 100  Resp 18  Ht  (1.702 m)  Wt 174 lb (78.926 kg)  BMI 27.25 kg/m2  SpO2 99%  Patient alert and oriented and in no cardiopulmonary distress.  HEENT: No facial asymmetry, EOMI,   oropharynx pink and moist.  Neck supple no JVD, no mass.Bilateral cerumen impactions  Chest: Clear to auscultation bilaterally.  CVS: S1, S2 no murmurs, no S3.Regular rate.  ABD: Soft non tender.   Ext: No edema  MS: Adequate ROM spine, shoulders, hips and knees.  Skin: Intact, no ulcerations or rash noted.  Psych: Good eye contact, normal affect. Memory intact not, mildly anxious or depressed  appearing.  CNS: CN 2-12 intact, power,  normal throughout.no focal deficits noted.   Assessment & Plan   Essential hypertension Controlled, no change in medication DASH diet and commitment to daily physical activity for a minimum of 30 minutes discussed and encouraged, as a part of hypertension management. The importance of attaining a healthy weight is also discussed.  BP/Weight 05/01/2014 10/25/2013 03/20/2013 02/15/2013 01/02/2013 11/28/2012 11/17/2012  Systolic BP 126 120 122 120 142 126 142  Diastolic BP 74 72 64 76 70 70 72  Wt. (Lbs) 174 181.4 179.12 182 184.6 183.12 187  BMI 27.25 28.4 28.05 27.68 28.07 28.67 29.28         Seasonal allergies Increased symptoms with spring, resume daily flonase, med prescribed   IMPAIRED GLUCOSE TOLERANCE Deteriorated, needs to take a more disciplined approach to her food choice and eat on a regular schedule  Patient educated about the importance of limiting  Carbohydrate intake , the need to commit to daily physical activity for a minimum of 30 minutes , and to commit weight loss. The fact that changes in all these areas will reduce or eliminate all together the development of diabetes is stressed.   Diabetic Labs Latest Ref Rng 04/27/2014 10/21/2013 04/21/2013 11/12/2012 05/20/2012  HbA1c <5.7 % 6.4(H) 6.3(H) 6.1(H) 6.3(H) 6.1(H)  Chol 0 - 200 mg/dL 782 956 213 086 578(I)  HDL >=46 mg/dL 75 73 71 66 64  Calc LDL 0 - 99 mg/dL 696(E) 952(W) 413(K) 99 129(H)  Triglycerides <150 mg/dL 48  43 40 31 77  Creatinine 0.50 - 1.10 mg/dL 1.610.66 0.960.72 0.450.72 4.090.77 8.110.78   BP/Weight 05/01/2014 10/25/2013 03/20/2013 02/15/2013 01/02/2013 11/28/2012 11/17/2012  Systolic BP 126 120 122 120 142 126 142  Diastolic BP 74 72 64 76 70 70 72  Wt. (Lbs) 174 181.4 179.12 182 184.6 183.12 187  BMI 27.25 28.4 28.05 27.68 28.07 28.67 29.28   No flowsheet data found.      Hyperlipidemia LDL goal <100 Unchanged, no med change though not at goal Hyperlipidemia:Low fat  diet discussed and encouraged.   Lipid Panel  Lab Results  Component Value Date   CHOL 192 04/27/2014   HDL 75 04/27/2014   LDLCALC 107* 04/27/2014   TRIG 48 04/27/2014   CHOLHDL 2.6 04/27/2014      Updated lab needed at/ before next visit.    Overweight (BMI 25.0-29.9) Improved. Pt applauded on succesful weight loss through lifestyle change, and encouraged to continue same. Weight loss goal set for the next several months.    Bilateral impacted cerumen Upcoming appt with ENT for irrigation in next approx 2 weeks       Review of Systems     Objective:   Physical Exam        Assessment & Plan:

## 2014-05-17 ENCOUNTER — Ambulatory Visit (INDEPENDENT_AMBULATORY_CARE_PROVIDER_SITE_OTHER): Payer: 59 | Admitting: Otolaryngology

## 2014-05-17 DIAGNOSIS — H900 Conductive hearing loss, bilateral: Secondary | ICD-10-CM | POA: Diagnosis not present

## 2014-05-17 DIAGNOSIS — J310 Chronic rhinitis: Secondary | ICD-10-CM

## 2014-05-17 DIAGNOSIS — H6123 Impacted cerumen, bilateral: Secondary | ICD-10-CM | POA: Diagnosis not present

## 2014-05-29 ENCOUNTER — Emergency Department (HOSPITAL_COMMUNITY)
Admission: EM | Admit: 2014-05-29 | Discharge: 2014-05-29 | Disposition: A | Discharge status: Home / Self Care | Payer: 59 | Attending: Emergency Medicine | Admitting: Emergency Medicine

## 2014-05-29 ENCOUNTER — Emergency Department (HOSPITAL_COMMUNITY): Payer: 59

## 2014-05-29 ENCOUNTER — Encounter (HOSPITAL_COMMUNITY): Payer: Self-pay | Admitting: Emergency Medicine

## 2014-05-29 ENCOUNTER — Telehealth: Payer: Self-pay

## 2014-05-29 DIAGNOSIS — Z8709 Personal history of other diseases of the respiratory system: Secondary | ICD-10-CM | POA: Diagnosis not present

## 2014-05-29 DIAGNOSIS — S50811A Abrasion of right forearm, initial encounter: Secondary | ICD-10-CM | POA: Insufficient documentation

## 2014-05-29 DIAGNOSIS — S161XXA Strain of muscle, fascia and tendon at neck level, initial encounter: Secondary | ICD-10-CM

## 2014-05-29 DIAGNOSIS — Y92410 Unspecified street and highway as the place of occurrence of the external cause: Secondary | ICD-10-CM | POA: Insufficient documentation

## 2014-05-29 DIAGNOSIS — T2006XA Burn of unspecified degree of forehead and cheek, initial encounter: Secondary | ICD-10-CM | POA: Insufficient documentation

## 2014-05-29 DIAGNOSIS — E669 Obesity, unspecified: Secondary | ICD-10-CM | POA: Diagnosis not present

## 2014-05-29 DIAGNOSIS — S4992XA Unspecified injury of left shoulder and upper arm, initial encounter: Secondary | ICD-10-CM | POA: Insufficient documentation

## 2014-05-29 DIAGNOSIS — S0083XA Contusion of other part of head, initial encounter: Secondary | ICD-10-CM | POA: Insufficient documentation

## 2014-05-29 DIAGNOSIS — Z880 Allergy status to penicillin: Secondary | ICD-10-CM | POA: Insufficient documentation

## 2014-05-29 DIAGNOSIS — S0081XA Abrasion of other part of head, initial encounter: Secondary | ICD-10-CM | POA: Diagnosis not present

## 2014-05-29 DIAGNOSIS — S199XXA Unspecified injury of neck, initial encounter: Secondary | ICD-10-CM | POA: Diagnosis present

## 2014-05-29 DIAGNOSIS — E785 Hyperlipidemia, unspecified: Secondary | ICD-10-CM | POA: Diagnosis not present

## 2014-05-29 DIAGNOSIS — S50812A Abrasion of left forearm, initial encounter: Secondary | ICD-10-CM | POA: Insufficient documentation

## 2014-05-29 DIAGNOSIS — T22012A Burn of unspecified degree of left forearm, initial encounter: Secondary | ICD-10-CM | POA: Diagnosis not present

## 2014-05-29 DIAGNOSIS — I10 Essential (primary) hypertension: Secondary | ICD-10-CM | POA: Diagnosis not present

## 2014-05-29 DIAGNOSIS — Y998 Other external cause status: Secondary | ICD-10-CM | POA: Insufficient documentation

## 2014-05-29 DIAGNOSIS — Y9389 Activity, other specified: Secondary | ICD-10-CM | POA: Insufficient documentation

## 2014-05-29 DIAGNOSIS — I998 Other disorder of circulatory system: Secondary | ICD-10-CM | POA: Diagnosis not present

## 2014-05-29 DIAGNOSIS — Z8719 Personal history of other diseases of the digestive system: Secondary | ICD-10-CM | POA: Diagnosis not present

## 2014-05-29 DIAGNOSIS — T22011A Burn of unspecified degree of right forearm, initial encounter: Secondary | ICD-10-CM | POA: Insufficient documentation

## 2014-05-29 HISTORY — DX: Other seasonal allergic rhinitis: J30.2

## 2014-05-29 MED ORDER — IBUPROFEN 800 MG PO TABS: 800.0000 mg | ORAL_TABLET | Freq: Three times a day (TID) | ORAL | Status: DC

## 2014-05-29 MED ORDER — BACITRACIN ZINC 500 UNIT/GM EX OINT: 1.0000 "application " | TOPICAL_OINTMENT | Freq: Two times a day (BID) | CUTANEOUS | Status: DC

## 2014-05-29 MED ORDER — BACITRACIN ZINC 500 UNIT/GM EX OINT
1.0000 "application " | TOPICAL_OINTMENT | Freq: Two times a day (BID) | CUTANEOUS | Status: DC
  Administered 2014-05-29: 1 via TOPICAL
  Filled 2014-05-29: qty 0.9

## 2014-05-29 NOTE — Discharge Instructions (Signed)
Please call your doctor for a followup appointment within 24-48 hours. When you talk to your doctor please let them know that you were seen in the emergency department and have them acquire all of your records so that they can discuss the findings with you and formulate a treatment plan to fully care for your new and ongoing problems. ° °

## 2014-05-29 NOTE — Telephone Encounter (Signed)
Yes, pls sched
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
## 2014-05-29 NOTE — ED Notes (Signed)
MD student at bedside
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
## 2014-05-29 NOTE — Telephone Encounter (Signed)
Patient aware of appointment for 5/11 @1045 

## 2014-05-30 ENCOUNTER — Ambulatory Visit (INDEPENDENT_AMBULATORY_CARE_PROVIDER_SITE_OTHER): Payer: 59 | Admitting: Family Medicine

## 2014-05-30 ENCOUNTER — Encounter: Payer: Self-pay | Admitting: Family Medicine

## 2014-05-30 VITALS — BP 122/78 | HR 73 | Resp 16 | Ht 67.0 in | Wt 162.4 lb

## 2014-05-30 DIAGNOSIS — F418 Other specified anxiety disorders: Secondary | ICD-10-CM | POA: Diagnosis not present

## 2014-05-30 DIAGNOSIS — I10 Essential (primary) hypertension: Secondary | ICD-10-CM

## 2014-05-30 MED ORDER — ALPRAZOLAM 0.25 MG PO TABS: ORAL_TABLET | ORAL | Status: DC

## 2014-05-30 MED ORDER — MIRTAZAPINE 7.5 MG PO TABS: 7.5000 mg | ORAL_TABLET | Freq: Every day | ORAL | Status: DC
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
## 2014-06-03 NOTE — Assessment & Plan Note (Signed)
   Controlled, no change in medication DASH diet and commitment to daily physical activity for a minimum of 30 minutes discussed and encouraged, as a part of hypertension management. The importance of attaining a healthy weight is also discussed.  BP/Weight 05/30/2014 05/29/2014 05/01/2014 10/25/2013 03/20/2013 02/15/2013 01/02/2013  Systolic BP 122 116 126 120 122 120 142  Diastolic BP 78 79 74 72 64 76 70  Wt. (Lbs) 162.4 175 174 181.4 179.12 182 184.6  BMI 25.43 26.61 27.25 28.4 28.05 27.68 28.07
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
## 2014-06-19 ENCOUNTER — Telehealth: Payer: Self-pay | Admitting: Family Medicine

## 2014-06-19 MED ORDER — HYDROCHLOROTHIAZIDE 25 MG PO TABS: ORAL_TABLET | ORAL | Status: DC

## 2014-06-19 NOTE — Telephone Encounter (Signed)
Med sent.

## 2014-07-31 ENCOUNTER — Ambulatory Visit: Payer: 59 | Admitting: Family Medicine

## 2014-08-21 IMAGING — CR DG ANKLE COMPLETE 3+V*R*
3 series · 3 of 3 positions shown · non-contrast
Comparison: None.

CLINICAL DATA: Right posterior ankle pain for 1 month.

EXAM:
RIGHT ANKLE - COMPLETE 3+ VIEW

[view not recorded (1 of 3)]
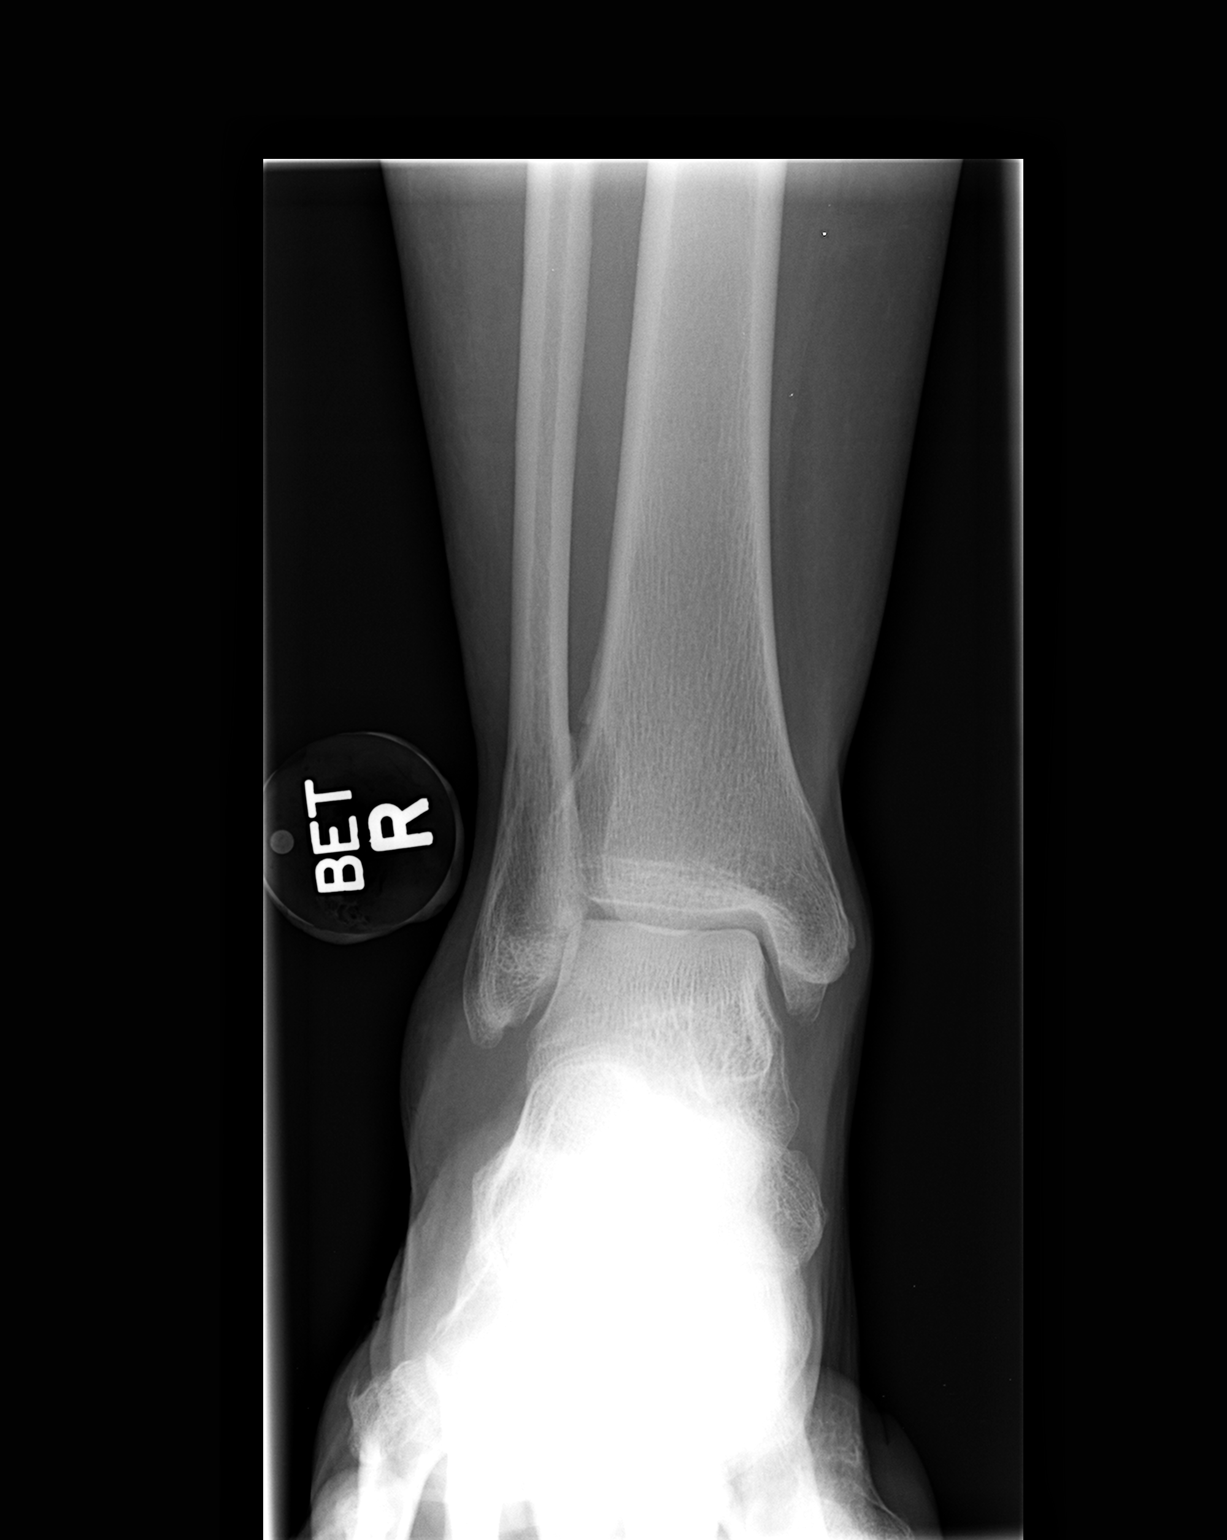

[view not recorded (2 of 3)]
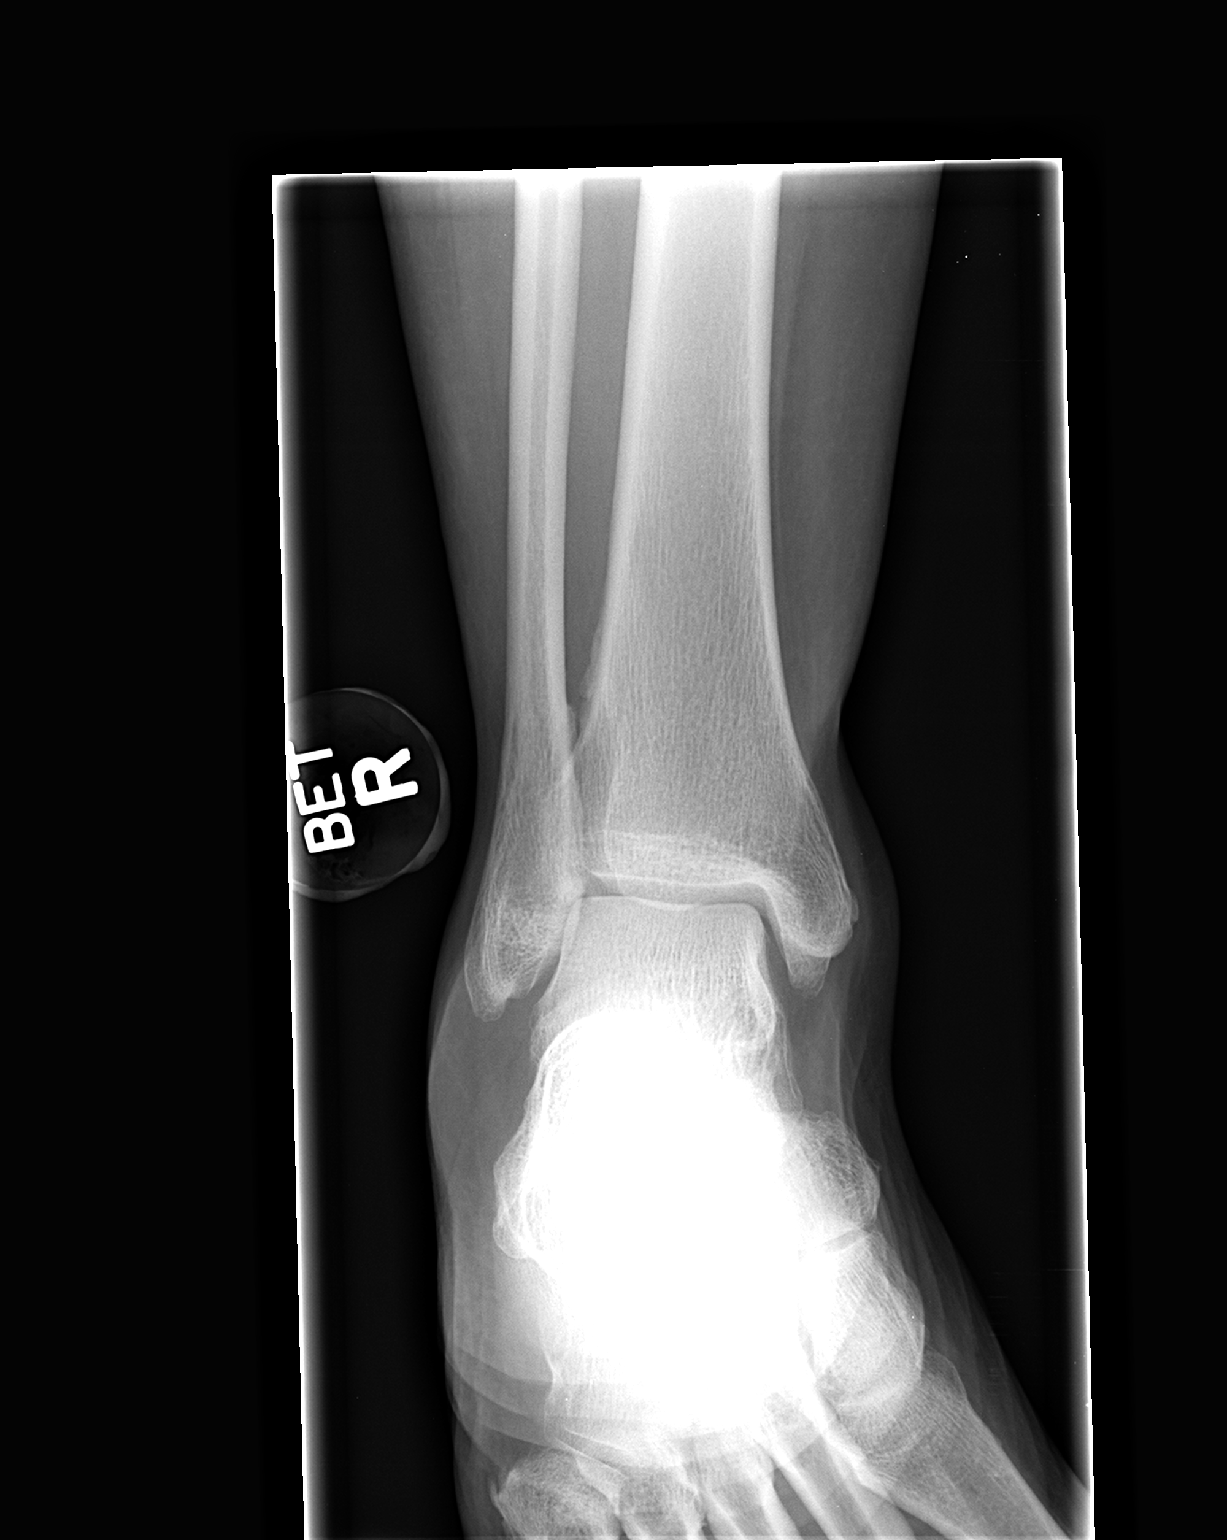

[view not recorded (3 of 3)]
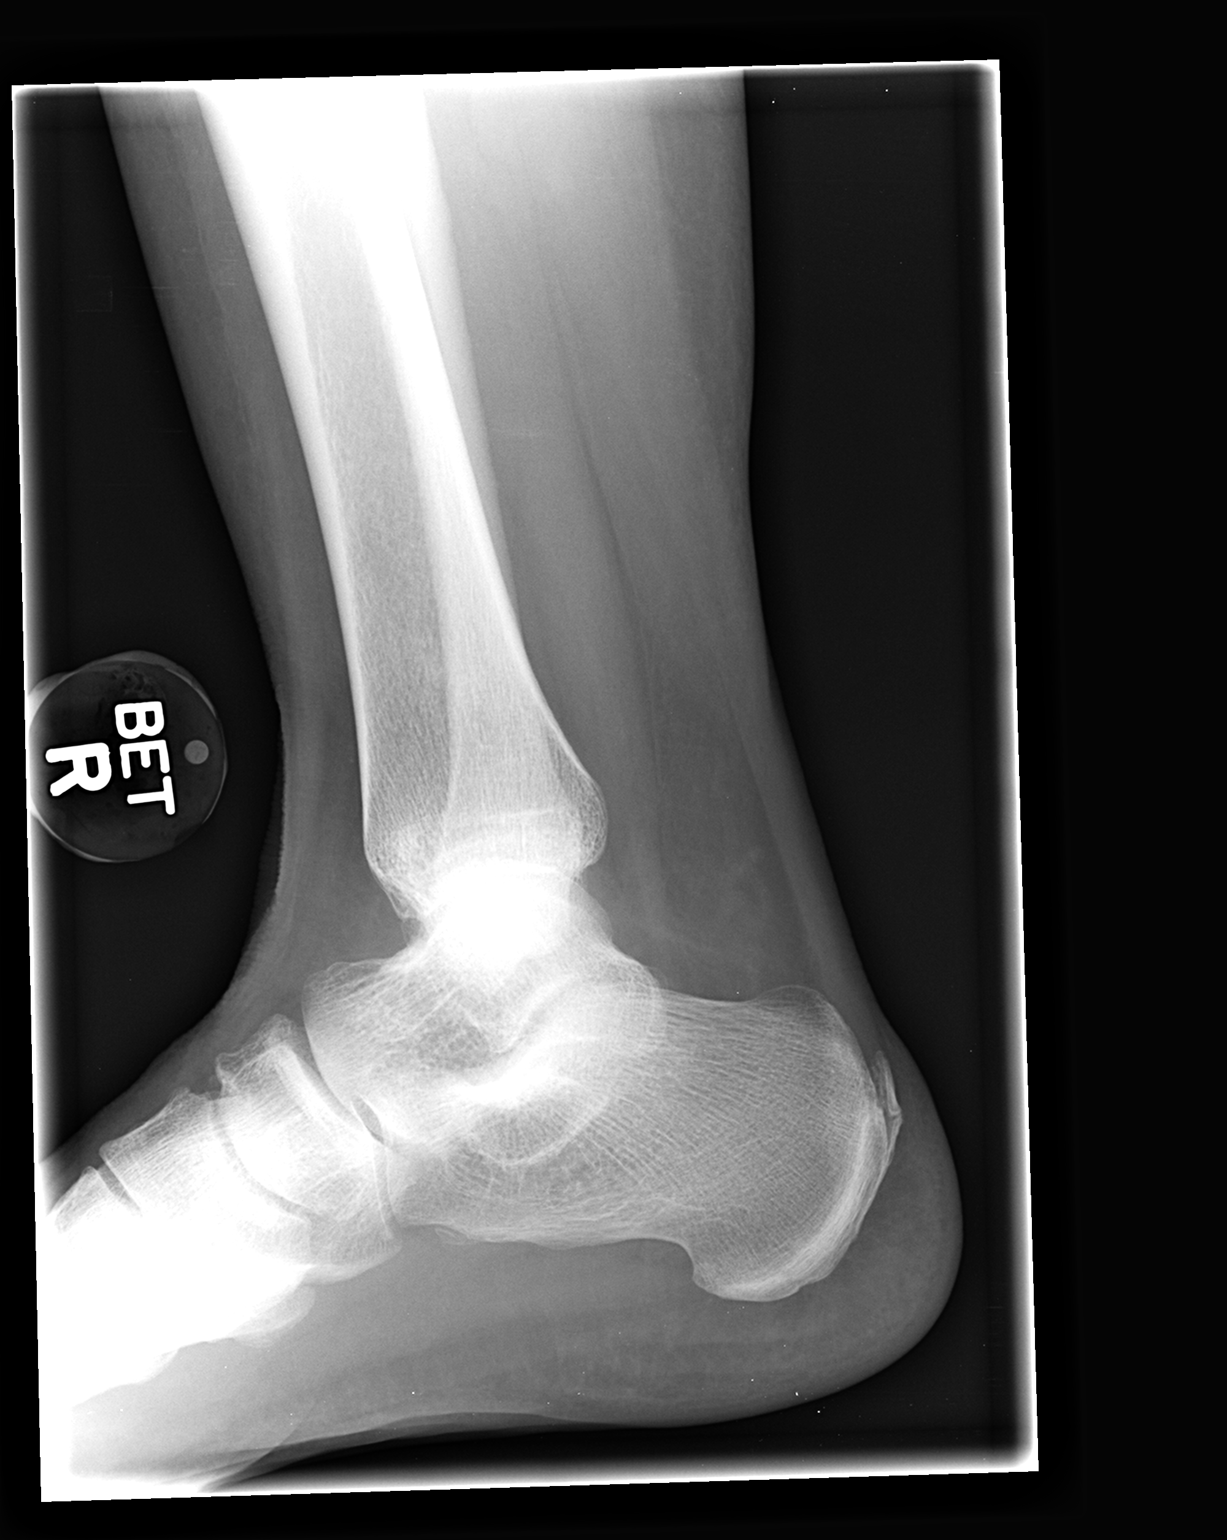

[3 of 3 positions shown; findings below may reference images not displayed]

FINDINGS: Soft tissue swelling below the lateral malleolus noted. Plafond and
talar dome appear intact. No malleolar fracture observed.

Achilles calcaneal spurs present.  Mild dorsal midfoot spurring.
IMPRESSION: 1. No acute bony findings. Mild soft tissue swelling is suggested
along the lower lateral ankle.
2. Achilles calcaneal spur.

## 2014-08-23 ENCOUNTER — Ambulatory Visit: Payer: 59 | Admitting: Family Medicine

## 2014-08-30 ENCOUNTER — Ambulatory Visit: Payer: 59 | Admitting: Family Medicine

## 2014-09-13 ENCOUNTER — Telehealth: Payer: Self-pay | Admitting: *Deleted

## 2014-09-13 ENCOUNTER — Other Ambulatory Visit: Payer: Self-pay

## 2014-09-13 DIAGNOSIS — F418 Other specified anxiety disorders: Secondary | ICD-10-CM

## 2014-09-13 MED ORDER — ALPRAZOLAM 0.25 MG PO TABS: ORAL_TABLET | ORAL | Status: DC

## 2014-09-13 NOTE — Telephone Encounter (Signed)
Pt called requesting her xanax to be refilled to [facility] in [place]

## 2014-09-17 ENCOUNTER — Telehealth: Payer: Self-pay | Admitting: *Deleted

## 2014-09-17 MED ORDER — LOVASTATIN 40 MG PO TABS: 40.0000 mg | ORAL_TABLET | Freq: Every day | ORAL | Status: DC

## 2014-09-17 NOTE — Telephone Encounter (Signed)
Med refilled.

## 2014-09-17 NOTE — Telephone Encounter (Signed)
Pt called requesting a refill on lovastatin at [facility] in [place]

## 2014-09-29 ENCOUNTER — Other Ambulatory Visit: Payer: Self-pay | Admitting: Family Medicine

## 2014-09-30 LAB — CBC
HCT: 37.1 % (ref 36.0–46.0)
Hemoglobin: 11.8 g/dL — ABNORMAL LOW (ref 12.0–15.0)
MCH: 26.9 pg (ref 26.0–34.0)
MCHC: 31.8 g/dL (ref 30.0–36.0)
MCV: 84.7 fL (ref 78.0–100.0)
MPV: 10.5 fL (ref 8.6–12.4)
Platelets: 331 10*3/uL (ref 150–400)
RBC: 4.38 MIL/uL (ref 3.87–5.11)
RDW: 14.6 % (ref 11.5–15.5)
WBC: 4.1 10*3/uL (ref 4.0–10.5)

## 2014-09-30 LAB — COMPREHENSIVE METABOLIC PANEL
ALT: 13 U/L (ref 6–29)
AST: 19 U/L (ref 10–35)
Albumin: 4.2 g/dL (ref 3.6–5.1)
Alkaline Phosphatase: 70 U/L (ref 33–130)
BUN: 16 mg/dL (ref 7–25)
CO2: 27 mmol/L (ref 20–31)
Calcium: 9.2 mg/dL (ref 8.6–10.4)
Chloride: 105 mmol/L (ref 98–110)
Creat: 0.68 mg/dL (ref 0.50–1.05)
Glucose, Bld: 76 mg/dL (ref 65–99)
Potassium: 3.8 mmol/L (ref 3.5–5.3)
Sodium: 141 mmol/L (ref 135–146)
Total Bilirubin: 0.5 mg/dL (ref 0.2–1.2)
Total Protein: 6.9 g/dL (ref 6.1–8.1)

## 2014-09-30 LAB — LIPID PANEL
Cholesterol: 181 mg/dL (ref 125–200)
HDL: 84 mg/dL (ref >=46)
LDL Cholesterol: 90 mg/dL (ref <130)
Total CHOL/HDL Ratio: 2.2 Ratio (ref <=5.0)
Triglycerides: 37 mg/dL (ref <150)
VLDL: 7 mg/dL (ref <30)

## 2014-09-30 LAB — HEMOGLOBIN A1C
Hgb A1c MFr Bld: 6.1 % — ABNORMAL HIGH (ref <5.7)
Mean Plasma Glucose: 128 mg/dL — ABNORMAL HIGH (ref <117)

## 2014-10-01 LAB — FERRITIN: Ferritin: 104 ng/mL (ref 10–291)

## 2014-10-01 LAB — IRON: Iron: 68 ug/dL (ref 45–160)

## 2014-10-02 ENCOUNTER — Encounter: Payer: Self-pay | Admitting: Family Medicine

## 2014-10-02 ENCOUNTER — Ambulatory Visit (INDEPENDENT_AMBULATORY_CARE_PROVIDER_SITE_OTHER): Payer: 59 | Admitting: Family Medicine

## 2014-10-02 VITALS — BP 120/64 | HR 87 | Resp 16 | Ht 67.0 in | Wt 165.0 lb

## 2014-10-02 DIAGNOSIS — F418 Other specified anxiety disorders: Secondary | ICD-10-CM

## 2014-10-02 DIAGNOSIS — E739 Lactose intolerance, unspecified: Secondary | ICD-10-CM | POA: Diagnosis not present

## 2014-10-02 DIAGNOSIS — Z1211 Encounter for screening for malignant neoplasm of colon: Secondary | ICD-10-CM

## 2014-10-02 DIAGNOSIS — E8881 Metabolic syndrome: Secondary | ICD-10-CM

## 2014-10-02 DIAGNOSIS — I10 Essential (primary) hypertension: Secondary | ICD-10-CM

## 2014-10-02 DIAGNOSIS — I639 Cerebral infarction, unspecified: Secondary | ICD-10-CM

## 2014-10-02 DIAGNOSIS — E785 Hyperlipidemia, unspecified: Secondary | ICD-10-CM | POA: Diagnosis not present

## 2014-10-02 DIAGNOSIS — E663 Overweight: Secondary | ICD-10-CM

## 2014-10-02 LAB — POC HEMOCCULT BLD/STL (OFFICE/1-CARD/DIAGNOSTIC): Fecal Occult Blood, POC: NEGATIVE

## 2014-10-02 NOTE — Patient Instructions (Addendum)
F/u in 5.5 month, call if you need me before Send in flu vac  CONGRATS on improved health  Rectal exam today  Start daily  one a day vitasmin  CBC, fasting lipid, cmp and EGFR, HBa1c in 5.5 month  Please work on good  health habits so that your health will improve. 1. Commitment to daily physical activity for 30 to 60  minutes, if you are able to do this.  2. Commitment to wise food choices. Aim for half of your  food intake to be vegetable and fruit, one quarter starchy foods, and one quarter protein. Try to eat on a regular schedule  3 meals per day, snacking between meals should be limited to vegetables or fruits or small portions of nuts. 64 ounces of water per day is generally recommended, unless you have specific health conditions, like heart failure or kidney failure where you will need to limit fluid intake.  3. Commitment to sufficient and a  good quality of physical and mental rest daily, generally between 6 to 8 hours per day.  WITH PERSISTANCE AND PERSEVERANCE, THE IMPOSSIBLE , BECOMES THE NORM!  Thanks for choosing [facility], we consider it a privelige to serve you.

## 2014-10-02 NOTE — Assessment & Plan Note (Signed)
The increased risk of cardiovascular disease associated with this diagnosis, and the need to consistently work on lifestyle to change this is discussed. Following  a  heart healthy diet ,commitment to 30 minutes of exercise at least 5 days per week, as well as control of blood sugar and cholesterol , and achieving a healthy weight are all the areas to be addressed .  

## 2014-10-02 NOTE — Assessment & Plan Note (Signed)
Marked improvement , and on no medication

## 2014-10-02 NOTE — Assessment & Plan Note (Signed)
Heme negative stool , no rectal mass 

## 2014-10-02 NOTE — Assessment & Plan Note (Signed)
Improved. Patient re-educated about  the importance of commitment to a  minimum of 150 minutes of exercise per week.  The importance of healthy food choices with portion control discussed. Encouraged to start a food diary, count calories and to consider  joining a support group. Sample diet sheets offered. Goals set by the patient for the next several months.   Weight /BMI 10/02/2014 05/30/2014 05/29/2014  WEIGHT 165 lb 162 lb 6.4 oz 175 lb  HEIGHT     BMI 25.84 kg/m2 25.43 kg/m2 26.61 kg/m2    Current exercise per week 150 minutes.

## 2014-10-02 NOTE — Assessment & Plan Note (Signed)
Improved and controlled Hyperlipidemia:Low fat diet discussed and encouraged.   Lipid Panel  Lab Results  Component Value Date   CHOL 181 09/29/2014   HDL 84 09/29/2014   LDLCALC 90 09/29/2014   TRIG 37 09/29/2014   CHOLHDL 2.2 09/29/2014      No med change

## 2014-10-02 NOTE — Assessment & Plan Note (Signed)
Controlled, no change in medication DASH diet and commitment to daily physical activity for a minimum of 30 minutes discussed and encouraged, as a part of hypertension management. The importance of attaining a healthy weight is also discussed.  BP/Weight 10/02/2014 05/30/2014 05/29/2014 05/01/2014 10/25/2013 03/20/2013 02/15/2013  Systolic BP 120 122 116 126 120 122 120  Diastolic BP 64 78 79 74 72 64 76  Wt. (Lbs) 165 162.4 175 174 181.4 179.12 182  BMI 25.84 25.43 26.61 27.25 28.4 28.05 27.68

## 2014-10-02 NOTE — Progress Notes (Signed)
Subjective:    Patient ID: Megan Villa, female    DOB: 11/15/1955, 59 y.o.   MRN: [id number]  HPI    Megan Villa     MRN: [id number]      DOB: Jan 16, 1956   HPI [name] is here for follow up and re-evaluation of chronic medical conditions, medication management and review of any available recent lab and radiology data.  Preventive health is updated, specifically  Cancer screening and Immunization.   Questions or concerns regarding consultations or procedures which the PT has had in the interim are  addressed. The PT denies any adverse reactions to current medications since the last visit.  There are no new concerns.  There are no specific complaints   ROS Denies recent fever or chills. Denies sinus pressure, nasal congestion, ear pain or sore throat. Denies chest congestion, productive cough or wheezing. Denies chest pains, palpitations and leg swelling Denies abdominal pain, nausea, vomiting,diarrhea or constipation.   Denies dysuria, frequency, hesitancy or incontinence. Denies joint pain, swelling and limitation in mobility. Denies headaches, seizures, numbness, or tingling. Denies depression, anxiety or insomnia. Denies skin break down or rash.   PE  BP 120/64 mmHg  Pulse 87  Resp 16  Ht  (1.702 m)  Wt 165 lb (74.844 kg)  BMI 25.84 kg/m2  SpO2 99%  Patient alert and oriented and in no cardiopulmonary distress.  HEENT: No facial asymmetry, EOMI,   oropharynx pink and moist.  Neck supple no JVD, no mass.  Chest: Clear to auscultation bilaterally.  CVS: S1, S2 no murmurs, no S3.Regular rate.  ABD: Soft non tender.No organomegaly or mass, normal bS Rectal: no mass, heme negative stool   Ext: No edema  MS: Adequate ROM spine, shoulders, hips and knees.  Skin: Intact, no ulcerations or rash noted.  Psych: Good eye contact, normal affect. Memory intact not anxious or depressed appearing.  CNS: CN 2-12 intact, power,  normal throughout.no focal  deficits noted.   Assessment & Plan  Essential hypertension Controlled, no change in medication DASH diet and commitment to daily physical activity for a minimum of 30 minutes discussed and encouraged, as a part of hypertension management. The importance of attaining a healthy weight is also discussed.  BP/Weight 10/02/2014 05/30/2014 05/29/2014 05/01/2014 10/25/2013 03/20/2013 02/15/2013  Systolic BP 120 122 116 126 120 122 120  Diastolic BP 64 78 79 74 72 64 76  Wt. (Lbs) 165 162.4 175 174 181.4 179.12 182  BMI 25.84 25.43 26.61 27.25 28.4 28.05 27.68        Hyperlipidemia LDL goal <100 Improved and controlled Hyperlipidemia:Low fat diet discussed and encouraged.   Lipid Panel  Lab Results  Component Value Date   CHOL 181 09/29/2014   HDL 84 09/29/2014   LDLCALC 90 09/29/2014   TRIG 37 09/29/2014   CHOLHDL 2.2 09/29/2014      No med change  Overweight (BMI 25.0-29.9) Improved. Patient re-educated about  the importance of commitment to a  minimum of 150 minutes of exercise per week.  The importance of healthy food choices with portion control discussed. Encouraged to start a food diary, count calories and to consider  joining a support group. Sample diet sheets offered. Goals set by the patient for the next several months.   Weight /BMI 10/02/2014 05/30/2014 05/29/2014  WEIGHT 165 lb 162 lb 6.4 oz 175 lb  HEIGHT     BMI 25.84 kg/m2 25.43 kg/m2 26.61 kg/m2    Current exercise per  week 150 minutes.   Depression with anxiety Marked improvement , and on no medication  Metabolic syndrome X The increased risk of cardiovascular disease associated with this diagnosis, and the need to consistently work on lifestyle to change this is discussed. Following  a  heart healthy diet ,commitment to 30 minutes of exercise at least 5 days per week, as well as control of blood sugar and cholesterol , and achieving a healthy weight are all the areas to be addressed  .   Special screening for malignant neoplasms, colon Heme negative stool, no rectal mass     Review of Systems     Objective:   Physical Exam        Assessment & Plan:

## 2014-10-05 ENCOUNTER — Other Ambulatory Visit: Payer: Self-pay

## 2014-10-05 LAB — HIV ANTIBODY (ROUTINE TESTING W REFLEX)

## 2014-10-05 LAB — HEPATITIS C ANTIBODY

## 2014-10-05 MED ORDER — POTASSIUM CHLORIDE CRYS ER 20 MEQ PO TBCR: 20.0000 meq | EXTENDED_RELEASE_TABLET | Freq: Every day | ORAL | Status: DC

## 2014-12-17 ENCOUNTER — Telehealth: Payer: Self-pay | Admitting: Family Medicine

## 2014-12-17 ENCOUNTER — Other Ambulatory Visit: Payer: Self-pay

## 2014-12-17 MED ORDER — HYDROCHLOROTHIAZIDE 25 MG PO TABS: ORAL_TABLET | ORAL | Status: DC

## 2014-12-17 MED ORDER — LOVASTATIN 40 MG PO TABS: 40.0000 mg | ORAL_TABLET | Freq: Every day | ORAL | Status: DC

## 2014-12-17 NOTE — Telephone Encounter (Signed)
Medications refilled

## 2014-12-17 NOTE — Telephone Encounter (Signed)
Patient is requesting a medication refill on lovastatin (MEVACOR) 40 MG tablet  And hydrochlorothiazide (HYDRODIURIL) 25 MG tablet please advise?

## 2015-03-04 ENCOUNTER — Ambulatory Visit: Payer: 59 | Admitting: Family Medicine

## 2015-03-14 ENCOUNTER — Ambulatory Visit: Payer: 59 | Admitting: Family Medicine

## 2015-04-05 ENCOUNTER — Other Ambulatory Visit: Payer: Self-pay | Admitting: Family Medicine

## 2015-04-05 ENCOUNTER — Telehealth: Payer: Self-pay

## 2015-04-05 DIAGNOSIS — E785 Hyperlipidemia, unspecified: Secondary | ICD-10-CM

## 2015-04-05 DIAGNOSIS — E739 Lactose intolerance, unspecified: Secondary | ICD-10-CM

## 2015-04-05 DIAGNOSIS — I10 Essential (primary) hypertension: Secondary | ICD-10-CM

## 2015-04-05 NOTE — Telephone Encounter (Signed)
Orders placed.

## 2015-04-06 LAB — CBC
HCT: 38 % (ref 36.0–46.0)
Hemoglobin: 12.1 g/dL (ref 12.0–15.0)
MCH: 27.1 pg (ref 26.0–34.0)
MCHC: 31.8 g/dL (ref 30.0–36.0)
MCV: 85.2 fL (ref 78.0–100.0)
MPV: 10.9 fL (ref 8.6–12.4)
Platelets: 307 10*3/uL (ref 150–400)
RBC: 4.46 MIL/uL (ref 3.87–5.11)
RDW: 14.7 % (ref 11.5–15.5)
WBC: 3.9 10*3/uL — ABNORMAL LOW (ref 4.0–10.5)

## 2015-04-06 LAB — LIPID PANEL
Cholesterol: 170 mg/dL (ref 125–200)
HDL: 82 mg/dL (ref >=46)
LDL Cholesterol: 81 mg/dL (ref <130)
Total CHOL/HDL Ratio: 2.1 Ratio (ref <=5.0)
Triglycerides: 34 mg/dL (ref <150)
VLDL: 7 mg/dL (ref <30)

## 2015-04-06 LAB — COMPLETE METABOLIC PANEL WITH GFR
ALT: 12 U/L (ref 6–29)
AST: 18 U/L (ref 10–35)
Albumin: 4 g/dL (ref 3.6–5.1)
Alkaline Phosphatase: 75 U/L (ref 33–130)
BUN: 14 mg/dL (ref 7–25)
CO2: 28 mmol/L (ref 20–31)
Calcium: 9.1 mg/dL (ref 8.6–10.4)
Chloride: 105 mmol/L (ref 98–110)
Creat: 0.69 mg/dL (ref 0.50–1.05)
GFR, Est African American: >89 mL/min (ref >=60)
GFR, Est Non African American: >89 mL/min (ref >=60)
Glucose, Bld: 99 mg/dL (ref 65–99)
Potassium: 3.8 mmol/L (ref 3.5–5.3)
Sodium: 141 mmol/L (ref 135–146)
Total Bilirubin: 0.4 mg/dL (ref 0.2–1.2)
Total Protein: 6.6 g/dL (ref 6.1–8.1)

## 2015-04-06 LAB — HEMOGLOBIN A1C
Hgb A1c MFr Bld: 6.2 % — ABNORMAL HIGH (ref <5.7)
Mean Plasma Glucose: 131 mg/dL — ABNORMAL HIGH (ref <117)

## 2015-04-08 ENCOUNTER — Ambulatory Visit (INDEPENDENT_AMBULATORY_CARE_PROVIDER_SITE_OTHER): Payer: 59 | Admitting: Family Medicine

## 2015-04-08 ENCOUNTER — Other Ambulatory Visit: Payer: Self-pay | Admitting: Family Medicine

## 2015-04-08 ENCOUNTER — Encounter: Payer: Self-pay | Admitting: Family Medicine

## 2015-04-08 VITALS — BP 120/74 | HR 71 | Resp 16 | Ht 67.0 in | Wt 171.0 lb

## 2015-04-08 DIAGNOSIS — I10 Essential (primary) hypertension: Secondary | ICD-10-CM | POA: Diagnosis not present

## 2015-04-08 DIAGNOSIS — E739 Lactose intolerance, unspecified: Secondary | ICD-10-CM

## 2015-04-08 DIAGNOSIS — J302 Other seasonal allergic rhinitis: Secondary | ICD-10-CM | POA: Diagnosis not present

## 2015-04-08 DIAGNOSIS — Z1231 Encounter for screening mammogram for malignant neoplasm of breast: Secondary | ICD-10-CM

## 2015-04-08 DIAGNOSIS — E663 Overweight: Secondary | ICD-10-CM

## 2015-04-08 DIAGNOSIS — E785 Hyperlipidemia, unspecified: Secondary | ICD-10-CM

## 2015-04-08 DIAGNOSIS — E8881 Metabolic syndrome: Secondary | ICD-10-CM

## 2015-04-08 DIAGNOSIS — R102 Pelvic and perineal pain: Secondary | ICD-10-CM | POA: Diagnosis not present

## 2015-04-08 MED ORDER — POTASSIUM CHLORIDE CRYS ER 20 MEQ PO TBCR: 20.0000 meq | EXTENDED_RELEASE_TABLET | Freq: Every day | ORAL | Status: DC

## 2015-04-08 MED ORDER — FLUTICASONE PROPIONATE 50 MCG/ACT NA SUSP: 2.0000 | Freq: Every day | NASAL | Status: DC

## 2015-04-08 MED ORDER — HYDROCHLOROTHIAZIDE 25 MG PO TABS: ORAL_TABLET | ORAL | Status: DC

## 2015-04-08 NOTE — Assessment & Plan Note (Signed)
2 week h/o left pelvic pain, reproducible on exam, needs pelvic [place]

## 2015-04-08 NOTE — Patient Instructions (Signed)
CPE and pap in 3.5 month to 4 month, call if you need me sooner  Please change eating as discussed to improve health  You are referred for a pelvic US due to new pain  Please schedule your mamogram this is due  Thanks for choosing [facility], we consider it a privelige to serve you.

## 2015-04-09 LAB — HEPATITIS C ANTIBODY: HCV Ab: NEGATIVE

## 2015-04-09 NOTE — Assessment & Plan Note (Signed)
Deteriorated Patient educated about the importance of limiting  Carbohydrate intake , the need to commit to daily physical activity for a minimum of 30 minutes , and to commit weight loss. The fact that changes in all these areas will reduce or eliminate all together the development of diabetes is stressed.   Diabetic Labs Latest Ref Rng 04/05/2015 09/29/2014 04/27/2014 10/21/2013 04/21/2013  HbA1c <5.7 % 6.2(H) 6.1(H) 6.4(H) 6.3(H) 6.1(H)  Chol 125 - 200 mg/dL 161170 096181 045192 409188 811181  HDL >=46 mg/dL 82 84 75 73 71  Calc LDL <130 mg/dL 81 90 914(N107(H) 829(F106(H) 621(H102(H)  Triglycerides <150 mg/dL 34 37 48 43 40  Creatinine 0.50 - 1.05 mg/dL 0.860.69 5.780.68 4.690.66 6.290.72 5.280.72   BP/Weight 04/08/2015 10/02/2014 05/30/2014 05/29/2014 05/01/2014 10/25/2013 03/20/2013  Systolic BP 120 120 122 116 126 120 122  Diastolic BP 74 64 78 79 74 72 64  Wt. (Lbs) 171 165 162.4 175 174 181.4 179.12  BMI 26.78 25.84 25.43 26.61 27.25 28.4 28.05   No flowsheet data found.

## 2015-04-09 NOTE — Assessment & Plan Note (Signed)
Currently asymptomatic, encouraged to start use of daily medication when she develops a flare

## 2015-04-09 NOTE — Assessment & Plan Note (Signed)
Controlled, no change in medication Hyperlipidemia:Low fat diet discussed and encouraged.   Lipid Panel  Lab Results  Component Value Date   CHOL 170 04/05/2015   HDL 82 04/05/2015   LDLCALC 81 04/05/2015   TRIG 34 04/05/2015   CHOLHDL 2.1 04/05/2015

## 2015-04-09 NOTE — Progress Notes (Signed)
Subjective:    Patient ID: Megan Villa, female    DOB: 07/26/1955, 60 y.o.   MRN: [id number]  HPI   Megan Villa     MRN: [id number]      DOB: 07/26/1955   HPI [name] is here for follow up and re-evaluation of chronic medical conditions, medication management and review of any available recent lab and radiology data.  Preventive health is updated, specifically  Cancer screening and Immunization.   Questions or concerns regarding consultations or procedures which the PT has had in the interim are  addressed. The PT denies any adverse reactions to current medications since the last visit.  2 week h/o left lower abdominal pain , worse with direct pressure, denies any change in bowel movements   ROS Denies recent fever or chills. Denies sinus pressure, nasal congestion, ear pain or sore throat. Denies chest congestion, productive cough or wheezing. Denies chest pains, palpitations and leg swelling Denies  nausea, vomiting,diarrhea or constipation.   Denies dysuria, frequency, hesitancy or incontinence. Denies joint pain, swelling and limitation in mobility. Denies headaches, seizures, numbness, or tingling. Denies depression, anxiety or insomnia. Denies skin break down or rash.   PE  BP 120/74 mmHg  Pulse 71  Resp 16  Ht 5\' 7"  (1.702 m)  Wt 171 lb (77.565 kg)  BMI 26.78 kg/m2  SpO2 100%  Patient alert and oriented and in no cardiopulmonary distress.  HEENT: No facial asymmetry, EOMI,   oropharynx pink and moist.  Neck supple no JVD, no mass.  Chest: Clear to auscultation bilaterally.  CVS: S1, S2 no murmurs, no S3.Regular rate.  ABD: Soft , reproducible left lower quadrant tenderness, no guarding or rebound, in pelvic area. No suprapubic tenderness   Ext: No edema  MS: Adequate ROM spine, shoulders, hips and knees.  Skin: Intact, no ulcerations or rash noted.  Psych: Good eye contact, normal affect. Memory intact not anxious or depressed  appearing.  CNS: CN 2-12 intact, power,  normal throughout.no focal deficits noted.   Assessment & Plan   Pelvic pain in female 2 week h/o left pelvic pain, reproducible on exam, needs pelvic US  Essential hypertension Controlled, no change in medication DASH diet and commitment to daily physical activity for a minimum of 30 minutes discussed and encouraged, as a part of hypertension management. The importance of attaining a healthy weight is also discussed.  BP/Weight 04/08/2015 10/02/2014 05/30/2014 05/29/2014 05/01/2014 10/25/2013 03/20/2013  Systolic BP 120 120 122 116 126 120 122  Diastolic BP 74 64 78 79 74 72 64  Wt. (Lbs) 171 165 162.4 175 174 181.4 179.12  BMI 26.78 25.84 25.43 26.61 27.25 28.4 28.05        IMPAIRED GLUCOSE TOLERANCE Deteriorated Patient educated about the importance of limiting  Carbohydrate intake , the need to commit to daily physical activity for a minimum of 30 minutes , and to commit weight loss. The fact that changes in all these areas will reduce or eliminate all together the development of diabetes is stressed.   Diabetic Labs Latest Ref Rng 04/05/2015 09/29/2014 04/27/2014 10/21/2013 04/21/2013  HbA1c <5.7 % 6.2(H) 6.1(H) 6.4(H) 6.3(H) 6.1(H)  Chol 125 - 200 mg/dL 782170 956181 213192 086188 578181  HDL >=46 mg/dL 82 84 75 73 71  Calc LDL <130 mg/dL 81 90 469(G107(H) 295(M106(H) 841(L102(H)  Triglycerides <150 mg/dL 34 37 48 43 40  Creatinine 0.50 - 1.05 mg/dL 2.440.69 0.100.68 2.720.66 5.360.72 6.440.72   BP/Weight 04/08/2015 10/02/2014 05/30/2014 05/29/2014 05/01/2014 10/25/2013  03/20/2013  Systolic BP 120 120 122 116 126 120 122  Diastolic BP 74 64 78 79 74 72 64  Wt. (Lbs) 171 165 162.4 175 174 181.4 179.12  BMI 26.78 25.84 25.43 26.61 27.25 28.4 28.05   No flowsheet data found.     Hyperlipidemia LDL goal <100 Controlled, no change in medication Hyperlipidemia:Low fat diet discussed and encouraged.   Lipid Panel  Lab Results  Component Value Date   CHOL 170 04/05/2015   HDL 82 04/05/2015    LDLCALC 81 04/05/2015   TRIG 34 04/05/2015   CHOLHDL 2.1 04/05/2015        Overweight (BMI 25.0-29.9) Deteriorated. Patient re-educated about  the importance of commitment to a  minimum of 150 minutes of exercise per week.  The importance of healthy food choices with portion control discussed. Encouraged to start a food diary, count calories and to consider  joining a support group. Sample diet sheets offered. Goals set by the patient for the next several months.   Weight /BMI 04/08/2015 10/02/2014 05/30/2014  WEIGHT 171 lb 165 lb 162 lb 6.4 oz  HEIGHT     BMI 26.78 kg/m2 25.84 kg/m2 25.43 kg/m2    Current exercise per week 90 minutes.   Metabolic syndrome X The increased risk of cardiovascular disease associated with this diagnosis, and the need to consistently work on lifestyle to change this is discussed. Following  a  heart healthy diet ,commitment to 30 minutes of exercise at least 5 days per week, as well as control of blood sugar and cholesterol , and achieving a healthy weight are all the areas to be addressed .   Seasonal allergies Currently asymptomatic, encouraged to start use of daily medication when she develops a flare     Review of Systems     Objective:   Physical Exam        Assessment & Plan:

## 2015-04-09 NOTE — Assessment & Plan Note (Signed)
Controlled, no change in medication DASH diet and commitment to daily physical activity for a minimum of 30 minutes discussed and encouraged, as a part of hypertension management. The importance of attaining a healthy weight is also discussed.  BP/Weight 04/08/2015 10/02/2014 05/30/2014 05/29/2014 05/01/2014 10/25/2013 03/20/2013  Systolic BP 120 120 122 116 126 120 122  Diastolic BP 74 64 78 79 74 72 64  Wt. (Lbs) 171 165 162.4 175 174 181.4 179.12  BMI 26.78 25.84 25.43 26.61 27.25 28.4 28.05

## 2015-04-09 NOTE — Assessment & Plan Note (Signed)
The increased risk of cardiovascular disease associated with this diagnosis, and the need to consistently work on lifestyle to change this is discussed. Following  a  heart healthy diet ,commitment to 30 minutes of exercise at least 5 days per week, as well as control of blood sugar and cholesterol , and achieving a healthy weight are all the areas to be addressed .  

## 2015-04-09 NOTE — Assessment & Plan Note (Signed)
Deteriorated. Patient re-educated about  the importance of commitment to a  minimum of 150 minutes of exercise per week.  The importance of healthy food choices with portion control discussed. Encouraged to start a food diary, count calories and to consider  joining a support group. Sample diet sheets offered. Goals set by the patient for the next several months.   Weight /BMI 04/08/2015 10/02/2014 05/30/2014  WEIGHT 171 lb 165 lb 162 lb 6.4 oz  HEIGHT 5\' 7"  5\' 7"  5\' 7"   BMI 26.78 kg/m2 25.84 kg/m2 25.43 kg/m2    Current exercise per week 90 minutes.

## 2015-04-12 ENCOUNTER — Ambulatory Visit (HOSPITAL_COMMUNITY): Admission: RE | Admit: 2015-04-12 | Payer: 59 | Source: Ambulatory Visit

## 2015-04-12 ENCOUNTER — Other Ambulatory Visit (HOSPITAL_COMMUNITY): Payer: 59

## 2015-04-15 ENCOUNTER — Ambulatory Visit (HOSPITAL_COMMUNITY): Admission: RE | Admit: 2015-04-15 | Payer: 59 | Source: Ambulatory Visit

## 2015-04-15 ENCOUNTER — Ambulatory Visit (HOSPITAL_COMMUNITY)
Admission: RE | Admit: 2015-04-15 | Discharge: 2015-04-15 | Disposition: A | Discharge status: Home / Self Care | Payer: 59 | Source: Ambulatory Visit | Attending: Family Medicine | Admitting: Family Medicine

## 2015-04-15 DIAGNOSIS — Z1231 Encounter for screening mammogram for malignant neoplasm of breast: Secondary | ICD-10-CM | POA: Diagnosis not present

## 2015-05-06 ENCOUNTER — Telehealth: Payer: Self-pay | Admitting: Family Medicine

## 2015-05-06 NOTE — Telephone Encounter (Signed)
Called and notified patient that med was refilled in March and if she has any further problems to feel free to contact office.

## 2015-05-06 NOTE — Telephone Encounter (Signed)
Patient is asking for a refill on her potassium chloride SA (KLOR-CON M20) 20 MEQ tablet  Please advise?

## 2015-05-09 ENCOUNTER — Ambulatory Visit (INDEPENDENT_AMBULATORY_CARE_PROVIDER_SITE_OTHER): Payer: 59 | Admitting: Otolaryngology

## 2015-05-09 DIAGNOSIS — H6123 Impacted cerumen, bilateral: Secondary | ICD-10-CM | POA: Diagnosis not present

## 2015-05-09 DIAGNOSIS — J310 Chronic rhinitis: Secondary | ICD-10-CM | POA: Diagnosis not present

## 2015-07-09 ENCOUNTER — Encounter: Payer: 59 | Admitting: Family Medicine

## 2015-08-02 ENCOUNTER — Telehealth: Payer: Self-pay | Admitting: Family Medicine

## 2015-08-02 MED ORDER — LOVASTATIN 40 MG PO TABS: 40.0000 mg | ORAL_TABLET | Freq: Every day | ORAL | Status: DC

## 2015-08-02 NOTE — Telephone Encounter (Signed)
medrefilled

## 2015-08-02 NOTE — Telephone Encounter (Signed)
Megan Villa is asking for a med refill on lovastatin (MEVACOR) 40 MG tablet  [facility] [place]

## 2015-09-11 ENCOUNTER — Ambulatory Visit (INDEPENDENT_AMBULATORY_CARE_PROVIDER_SITE_OTHER): Payer: 59 | Admitting: Family Medicine

## 2015-09-11 ENCOUNTER — Encounter: Payer: Self-pay | Admitting: Family Medicine

## 2015-09-11 ENCOUNTER — Other Ambulatory Visit (HOSPITAL_COMMUNITY)
Admission: RE | Admit: 2015-09-11 | Discharge: 2015-09-11 | Disposition: A | Discharge status: Home / Self Care | Payer: 59 | Source: Ambulatory Visit | Attending: Family Medicine | Admitting: Family Medicine

## 2015-09-11 VITALS — BP 120/80 | HR 78 | Resp 16 | Ht 67.0 in | Wt 178.0 lb

## 2015-09-11 DIAGNOSIS — Z1151 Encounter for screening for human papillomavirus (HPV): Secondary | ICD-10-CM | POA: Diagnosis present

## 2015-09-11 DIAGNOSIS — Z01419 Encounter for gynecological examination (general) (routine) without abnormal findings: Secondary | ICD-10-CM | POA: Insufficient documentation

## 2015-09-11 DIAGNOSIS — E739 Lactose intolerance, unspecified: Secondary | ICD-10-CM

## 2015-09-11 DIAGNOSIS — I10 Essential (primary) hypertension: Secondary | ICD-10-CM | POA: Diagnosis not present

## 2015-09-11 DIAGNOSIS — Z0000 Encounter for general adult medical examination without abnormal findings: Secondary | ICD-10-CM

## 2015-09-11 DIAGNOSIS — Z1211 Encounter for screening for malignant neoplasm of colon: Secondary | ICD-10-CM | POA: Diagnosis not present

## 2015-09-11 DIAGNOSIS — E785 Hyperlipidemia, unspecified: Secondary | ICD-10-CM

## 2015-09-11 DIAGNOSIS — Z124 Encounter for screening for malignant neoplasm of cervix: Secondary | ICD-10-CM

## 2015-09-11 HISTORY — DX: Encounter for general adult medical examination without abnormal findings: Z00.00

## 2015-09-11 LAB — POC HEMOCCULT BLD/STL (OFFICE/1-CARD/DIAGNOSTIC): Fecal Occult Blood, POC: NEGATIVE

## 2015-09-11 NOTE — Assessment & Plan Note (Signed)

## 2015-09-11 NOTE — Patient Instructions (Addendum)
F/u in 6 month call if you need me sooner  Fasting lipid, cmp and HBA1C end September  Please work on good  health habits so that your health will improve. 1. Commitment to daily physical activity for 30 to 60  minutes, if you are able to do this.  2. Commitment to wise food choices. Aim for half of your  food intake to be vegetable and fruit, one quarter starchy foods, and one quarter protein. Try to eat on a regular schedule  3 meals per day, snacking between meals should be limited to vegetables or fruits or small portions of nuts. 64 ounces of water per day is generally recommended, unless you have specific health conditions, like heart failure or kidney failure where you will need to limit fluid intake.  3. Commitment to sufficient and a  good quality of physical and mental rest daily, generally between 6 to 8 hours per day.  WITH PERSISTANCE AND PERSEVERANCE, THE IMPOSSIBLE , BECOMES THE NORM! Thank you  for choosing [facility]. We consider it a privelige to serve you.  Delivering excellent health care in a caring and  compassionate way is our goal.  Partnering with you,  so that together we can achieve this goal is our strategy.

## 2015-09-13 LAB — CYTOLOGY - PAP

## 2015-09-13 NOTE — Progress Notes (Signed)
    Megan RaymondDiane Villa     MRN: [id number]      DOB: 06/20/1955  HPI: Patient is in for annual physical exam. No other health concerns are expressed or addressed at the visit. Recent labs, if available are reviewed. Immunization is reviewed , and  updated if needed.   PE: Pleasant  female, alert and oriented x 3, in no cardio-pulmonary distress. Afebrile. HEENT No facial trauma or asymetry. Sinuses non tender.  Extra occullar muscles intact, pupils equally reactive to light. External ears normal, tympanic membranes clear. Oropharynx moist, no exudate. Neck: supple, no adenopathy,JVD or thyromegaly.No bruits.  Chest: Clear to ascultation bilaterally.No crackles or wheezes. Non tender to palpation  Breast: No asymetry,no masses or lumps. No tenderness. No nipple discharge or inversion. No axillary or supraclavicular adenopathy  Cardiovascular system; Heart sounds normal,  S1 and  S2 ,no S3.  No murmur, or thrill. Apical beat not displaced Peripheral pulses normal.  Abdomen: Soft, non tender, no organomegaly or masses. No bruits. Bowel sounds normal. No guarding, tenderness or rebound.  Rectal:  Normal sphincter tone. No rectal mass. Guaiac negative stool.  GU: External genitalia normal female genitalia , normal female distribution of hair. No lesions. Urethral meatus normal in size, no  Prolapse, no lesions visibly  Present. Bladder non tender. Vagina pink and moist , with no visible lesions , discharge present . Adequate pelvic support no  cystocele or rectocele noted Cervix pink and appears healthy, no lesions or ulcerations noted, no discharge noted from os Uterus normal size, no adnexal masses, no cervical motion or adnexal tenderness.   Musculoskeletal exam: Full ROM of spine, hips , shoulders and knees. No deformity ,swelling or crepitus noted. No muscle wasting or atrophy.   Neurologic: Cranial nerves 2 to 12 intact. Power, tone ,sensation and reflexes  normal throughout. No disturbance in gait. No tremor.  Skin: Intact, no ulceration, erythema , scaling or rash noted. Pigmentation normal throughout  Psych; Normal mood and affect. Judgement and concentration normal   Assessment & Plan:  Annual physical exam Annual exam as documented. Counseling done  re healthy lifestyle involving commitment to 150 minutes exercise per week, heart healthy diet, and attaining healthy weight.The importance of adequate sleep also discussed. Regular seat belt use and home safety, is also discussed. Changes in health habits are decided on by the patient with goals and time frames  set for achieving them. Immunization and cancer screening needs are specifically addressed at this visit.

## 2015-10-19 LAB — LIPID PANEL
Cholesterol: 162 mg/dL (ref 125–200)
HDL: 75 mg/dL (ref >=46)
LDL Cholesterol: 81 mg/dL (ref <130)
Total CHOL/HDL Ratio: 2.2 Ratio (ref <=5.0)
Triglycerides: 28 mg/dL (ref <150)
VLDL: 6 mg/dL (ref <30)

## 2015-10-19 LAB — COMPREHENSIVE METABOLIC PANEL
ALT: 11 U/L (ref 6–29)
AST: 19 U/L (ref 10–35)
Albumin: 4 g/dL (ref 3.6–5.1)
Alkaline Phosphatase: 66 U/L (ref 33–130)
BUN: 14 mg/dL (ref 7–25)
CO2: 26 mmol/L (ref 20–31)
Calcium: 8.7 mg/dL (ref 8.6–10.4)
Chloride: 106 mmol/L (ref 98–110)
Creat: 0.65 mg/dL (ref 0.50–1.05)
Glucose, Bld: 89 mg/dL (ref 65–99)
Potassium: 3.8 mmol/L (ref 3.5–5.3)
Sodium: 143 mmol/L (ref 135–146)
Total Bilirubin: 0.4 mg/dL (ref 0.2–1.2)
Total Protein: 6.4 g/dL (ref 6.1–8.1)

## 2015-10-19 LAB — HEMOGLOBIN A1C
Hgb A1c MFr Bld: 5.9 % — ABNORMAL HIGH (ref <5.7)
Mean Plasma Glucose: 123 mg/dL

## 2015-10-26 ENCOUNTER — Other Ambulatory Visit: Payer: Self-pay | Admitting: Family Medicine

## 2015-10-28 ENCOUNTER — Other Ambulatory Visit: Payer: Self-pay | Admitting: Family Medicine

## 2015-10-28 MED ORDER — POTASSIUM CHLORIDE CRYS ER 20 MEQ PO TBCR: 20.0000 meq | EXTENDED_RELEASE_TABLET | Freq: Every day | ORAL | 5 refills | Status: DC

## 2015-10-28 NOTE — Telephone Encounter (Signed)
Med refill sent

## 2015-10-28 NOTE — Telephone Encounter (Signed)
Conchita is asking for a refill on her potassium chloride SA (KLOR-CON M20) 20 MEQ tablet  [facility] [place]

## 2016-01-10 ENCOUNTER — Other Ambulatory Visit: Payer: Self-pay | Admitting: Family Medicine
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
## 2016-03-31 ENCOUNTER — Telehealth: Payer: Self-pay | Admitting: Family Medicine

## 2016-03-31 NOTE — Telephone Encounter (Signed)
Aware its to f/u on BP and cholesterol

## 2016-03-31 NOTE — Telephone Encounter (Signed)
Megan Villa is calling asking why does she need to follow up tomorrow afternoon she doesn't understand what shes following up on , please advise?

## 2016-04-01 ENCOUNTER — Ambulatory Visit (INDEPENDENT_AMBULATORY_CARE_PROVIDER_SITE_OTHER): Payer: Managed Care, Other (non HMO) | Admitting: Family Medicine

## 2016-04-01 ENCOUNTER — Encounter: Payer: Self-pay | Admitting: Family Medicine

## 2016-04-01 VITALS — BP 114/64 | HR 68 | Temp 97.7°F | Resp 16 | Ht 67.0 in | Wt 182.0 lb

## 2016-04-01 DIAGNOSIS — E663 Overweight: Secondary | ICD-10-CM | POA: Diagnosis not present

## 2016-04-01 DIAGNOSIS — E8881 Metabolic syndrome: Secondary | ICD-10-CM | POA: Diagnosis not present

## 2016-04-01 DIAGNOSIS — I10 Essential (primary) hypertension: Secondary | ICD-10-CM | POA: Diagnosis not present

## 2016-04-01 DIAGNOSIS — E785 Hyperlipidemia, unspecified: Secondary | ICD-10-CM

## 2016-04-01 DIAGNOSIS — E739 Lactose intolerance, unspecified: Secondary | ICD-10-CM

## 2016-04-01 MED ORDER — CLOTRIMAZOLE-BETAMETHASONE 1-0.05 % EX CREA: 1.0000 "application " | TOPICAL_CREAM | Freq: Two times a day (BID) | CUTANEOUS | 1 refills | Status: DC

## 2016-04-01 MED ORDER — POTASSIUM CHLORIDE CRYS ER 20 MEQ PO TBCR: 20.0000 meq | EXTENDED_RELEASE_TABLET | Freq: Every day | ORAL | 1 refills | Status: DC

## 2016-04-01 MED ORDER — LOVASTATIN 40 MG PO TABS: 40.0000 mg | ORAL_TABLET | Freq: Every day | ORAL | 1 refills | Status: DC

## 2016-04-01 NOTE — Progress Notes (Signed)
Megan Villa     MRN: [id number]      DOB: [birth year]   HPI [name] is here for follow up and re-evaluation of chronic medical conditions, medication management and review of any available recent lab and radiology data.  Preventive health is updated, specifically  Cancer screening and Immunization.   Questions or concerns regarding consultations or procedures which the PT has had in the interim are  addressed. The PT denies any adverse reactions to current medications since the last visit.    ROS Denies recent fever or chills. Denies sinus pressure, nasal congestion, ear pain or sore throat. Denies chest congestion, productive cough or wheezing. Denies chest pains, palpitations and leg swelling Denies abdominal pain, nausea, vomiting,diarrhea or constipation.   Denies dysuria, frequency, hesitancy or incontinence. Denies joint pain, swelling and limitation in mobility. Denies headaches, seizures, numbness, or tingling. Denies depression, anxiety or insomnia. Denies skin break down or rash.   PE  BP 114/64 (BP Location: Right Arm, Patient Position: Sitting, Cuff Size: Normal)   Pulse 68   Temp 97.7 F (36.5 C) (Temporal)   Resp 16   Ht 5\' 7"  (1.702 m)   Wt 182 lb (82.6 kg)   SpO2 97%   BMI 28.51 kg/m   Patient alert and oriented and in no cardiopulmonary distress.  HEENT: No facial asymmetry, EOMI,   oropharynx pink and moist.  Neck supple no JVD, no mass.  Chest: Clear to auscultation bilaterally.  CVS: S1, S2 no murmurs, no S3.Regular rate.  ABD: Soft non tender.   Ext: No edema  MS: Adequate ROM spine, shoulders, hips and knees.  Skin: Intact, no ulcerations or rash noted.  Psych: Good eye contact, normal affect. Memory intact not anxious or depressed appearing.  CNS: CN 2-12 intact, power,  normal throughout.no focal deficits noted.   Assessment & Plan  Essential hypertension .,con1 DASH diet and commitment to daily physical activity for a  minimum of 30 minutes discussed and encouraged, as a part of hypertension management. The importance of attaining a healthy weight is also discussed.  BP/Weight 04/01/2016 09/11/2015 04/08/2015 10/02/2014 05/30/2014 05/29/2014 05/01/2014  Systolic BP 114 120 120 120 122 116 126  Diastolic BP 64 80 74 64 78 79 74  Wt. (Lbs) 182 178 171 165 162.4 175 174  BMI 28.51 27.88 26.78 25.84 25.43 26.61 27.25       Hyperlipidemia LDL goal <100 Hyperlipidemia:Low fat diet discussed and encouraged.   Lipid Panel  Lab Results  Component Value Date   CHOL 162 10/18/2015   HDL 75 10/18/2015   LDLCALC 81 10/18/2015   TRIG 28 10/18/2015   CHOLHDL 2.2 10/18/2015   Updated lab needed at/ before next visit.   \  IMPAIRED GLUCOSE TOLERANCE Patient educated about the importance of limiting  Carbohydrate intake , the need to commit to daily physical activity for a minimum of 30 minutes , and to commit weight loss. The fact that changes in all these areas will reduce or eliminate all together the development of diabetes is stressed.   Diabetic Labs Latest Ref Rng & Units 10/18/2015 04/05/2015 09/29/2014 04/27/2014 10/21/2013  HbA1c <5.7 % 5.9(H) 6.2(H) 6.1(H) 6.4(H) 6.3(H)  Chol 125 - 200 mg/dL 829 562 130 865 784  HDL >=46 mg/dL 75 82 84 75 73  Calc LDL <130 mg/dL 81 81 90 696(E) 952(W)  Triglycerides <150 mg/dL 28 34 37 48 43  Creatinine 0.50 - 1.05 mg/dL 4.13 2.44 0.10 2.72 5.36   BP/Weight  04/01/2016 09/11/2015 04/08/2015 10/02/2014 05/30/2014 05/29/2014 05/01/2014  Systolic BP 114 120 120 120 122 116 126  Diastolic BP 64 80 74 64 78 79 74  Wt. (Lbs) 182 178 171 165 162.4 175 174  BMI 28.51 27.88 26.78 25.84 25.43 26.61 27.25   No flowsheet data found.    Metabolic syndrome X The increased risk of cardiovascular disease associated with this diagnosis, and the need to consistently work on lifestyle to change this is discussed. Following  a  heart healthy diet ,commitment to 30 minutes of exercise at  least 5 days per week, as well as control of blood sugar and cholesterol , and achieving a healthy weight are all the areas to be addressed .   Overweight (BMI 25.0-29.9) Deteriorated. Patient re-educated about  the importance of commitment to a  minimum of 150 minutes of exercise per week.  The importance of healthy food choices with portion control discussed. Encouraged to start a food diary, count calories and to consider  joining a support group. Sample diet sheets offered. Goals set by the patient for the next several months.   Weight /BMI 04/01/2016 09/11/2015 04/08/2015  WEIGHT 182 lb 178 lb 171 lb  HEIGHT 5\' 7"  5\' 7"  5\' 7"   BMI 28.51 kg/m2 27.88 kg/m2 26.78 kg/m2

## 2016-04-01 NOTE — Assessment & Plan Note (Signed)
Deteriorated. Patient re-educated about  the importance of commitment to a  minimum of 150 minutes of exercise per week.  The importance of healthy food choices with portion control discussed. Encouraged to start a food diary, count calories and to consider  joining a support group. Sample diet sheets offered. Goals set by the patient for the next several months.   Weight /BMI 04/01/2016 09/11/2015 04/08/2015  WEIGHT 182 lb 178 lb 171 lb  HEIGHT 5\' 7"  5\' 7"  5\' 7"   BMI 28.51 kg/m2 27.88 kg/m2 26.78 kg/m2

## 2016-04-01 NOTE — Patient Instructions (Addendum)
Annual exam in September , call if you need me sooner  Cream sent for use twice daily on rash for 10 days then as needed  March 30 or shortly after , fasting CBC, lipid, cmp and egfr, Hba1c, tsh  Excellent blood pressure  MAMMOGRAM DUE MARCH 28 OR AFTER PLEASE CALL AND SCHEDULE  Please work on good  health habits so that your health will improve. 1. Commitment to daily physical activity for 30 to 60  minutes, if you are able to do this.  Weight loss goal of 5 to 7 pounds  2. Commitment to wise food choices. Aim for half of your  food intake to be vegetable and fruit, one quarter starchy foods, and one quarter protein. Try to eat on a regular schedule  3 meals per day, snacking between meals should be limited to vegetables or fruits or small portions of nuts. 64 ounces of water per day is generally recommended, unless you have specific health conditions, like heart failure or kidney failure where you will need to limit fluid intake.  3. Commitment to sufficient and a  good quality of physical and mental rest daily, generally between 6 to 8 hours per day.  WITH PERSISTANCE AND PERSEVERANCE, THE IMPOSSIBLE , BECOMES THE NORM! It is important that you exercise regularly at least 30 minutes 5 times a week. If you develop chest pain, have severe difficulty breathing, or feel very tired, stop exercising immediately and seek medical attention

## 2016-04-01 NOTE — Assessment & Plan Note (Signed)
Hyperlipidemia:Low fat diet discussed and encouraged.   Lipid Panel  Lab Results  Component Value Date   CHOL 162 10/18/2015   HDL 75 10/18/2015   LDLCALC 81 10/18/2015   TRIG 28 10/18/2015   CHOLHDL 2.2 10/18/2015   Updated lab needed at/ before next visit.   \

## 2016-04-01 NOTE — Assessment & Plan Note (Signed)
The increased risk of cardiovascular disease associated with this diagnosis, and the need to consistently work on lifestyle to change this is discussed. Following  a  heart healthy diet ,commitment to 30 minutes of exercise at least 5 days per week, as well as control of blood sugar and cholesterol , and achieving a healthy weight are all the areas to be addressed .  

## 2016-04-01 NOTE — Assessment & Plan Note (Signed)
.,  con1 DASH diet and commitment to daily physical activity for a minimum of 30 minutes discussed and encouraged, as a part of hypertension management. The importance of attaining a healthy weight is also discussed.  BP/Weight 04/01/2016 09/11/2015 04/08/2015 10/02/2014 05/30/2014 05/29/2014 05/01/2014  Systolic BP 114 120 120 120 122 116 126  Diastolic BP 64 80 74 64 78 79 74  Wt. (Lbs) 182 178 171 165 162.4 175 174  BMI 28.51 27.88 26.78 25.84 25.43 26.61 27.25

## 2016-04-01 NOTE — Assessment & Plan Note (Signed)
Patient educated about the importance of limiting  Carbohydrate intake , the need to commit to daily physical activity for a minimum of 30 minutes , and to commit weight loss. The fact that changes in all these areas will reduce or eliminate all together the development of diabetes is stressed.   Diabetic Labs Latest Ref Rng & Units 10/18/2015 04/05/2015 09/29/2014 04/27/2014 10/21/2013  HbA1c <5.7 % 5.9(H) 6.2(H) 6.1(H) 6.4(H) 6.3(H)  Chol 125 - 200 mg/dL 098162 119170 147181 829192 562188  HDL >=46 mg/dL 75 82 84 75 73  Calc LDL <130 mg/dL 81 81 90 130(Q107(H) 657(Q106(H)  Triglycerides <150 mg/dL 28 34 37 48 43  Creatinine 0.50 - 1.05 mg/dL 4.690.65 6.290.69 5.280.68 4.130.66 2.440.72   BP/Weight 04/01/2016 09/11/2015 04/08/2015 10/02/2014 05/30/2014 05/29/2014 05/01/2014  Systolic BP 114 120 120 120 122 116 126  Diastolic BP 64 80 74 64 78 79 74  Wt. (Lbs) 182 178 171 165 162.4 175 174  BMI 28.51 27.88 26.78 25.84 25.43 26.61 27.25   No flowsheet data found.

## 2016-04-17 LAB — COMPLETE METABOLIC PANEL WITH GFR
ALT: 13 U/L (ref 6–29)
AST: 19 U/L (ref 10–35)
Albumin: 3.8 g/dL (ref 3.6–5.1)
Alkaline Phosphatase: 70 U/L (ref 33–130)
BUN: 10 mg/dL (ref 7–25)
CO2: 29 mmol/L (ref 20–31)
Calcium: 9 mg/dL (ref 8.6–10.4)
Chloride: 106 mmol/L (ref 98–110)
Creat: 0.68 mg/dL (ref 0.50–1.05)
GFR, Est African American: >89 mL/min (ref >=60)
GFR, Est Non African American: >89 mL/min (ref >=60)
Glucose, Bld: 98 mg/dL (ref 65–99)
Potassium: 3.6 mmol/L (ref 3.5–5.3)
Sodium: 142 mmol/L (ref 135–146)
Total Bilirubin: 0.3 mg/dL (ref 0.2–1.2)
Total Protein: 6.5 g/dL (ref 6.1–8.1)

## 2016-04-17 LAB — CBC
HCT: 37.8 % (ref 35.0–45.0)
Hemoglobin: 11.9 g/dL (ref 11.7–15.5)
MCH: 27.2 pg (ref 27.0–33.0)
MCHC: 31.5 g/dL — ABNORMAL LOW (ref 32.0–36.0)
MCV: 86.3 fL (ref 80.0–100.0)
MPV: 10.6 fL (ref 7.5–12.5)
Platelets: 306 10*3/uL (ref 140–400)
RBC: 4.38 MIL/uL (ref 3.80–5.10)
RDW: 14.5 % (ref 11.0–15.0)
WBC: 4.5 10*3/uL (ref 3.8–10.8)

## 2016-04-17 LAB — TSH: TSH: 3.11 mIU/L

## 2016-04-17 LAB — LIPID PANEL
Cholesterol: 170 mg/dL (ref <200)
HDL: 83 mg/dL (ref >50)
LDL Cholesterol: 78 mg/dL (ref <100)
Total CHOL/HDL Ratio: 2 Ratio (ref <5.0)
Triglycerides: 46 mg/dL (ref <150)
VLDL: 9 mg/dL (ref <30)

## 2016-04-18 LAB — HEMOGLOBIN A1C
Hgb A1c MFr Bld: 5.9 % — ABNORMAL HIGH (ref <5.7)
Mean Plasma Glucose: 123 mg/dL

## 2016-04-19 ENCOUNTER — Encounter: Payer: Self-pay | Admitting: Family Medicine

## 2016-04-20 ENCOUNTER — Other Ambulatory Visit: Payer: Self-pay | Admitting: Family Medicine

## 2016-04-20 ENCOUNTER — Ambulatory Visit (HOSPITAL_COMMUNITY)
Admission: RE | Admit: 2016-04-20 | Discharge: 2016-04-20 | Disposition: A | Discharge status: Home / Self Care | Payer: Managed Care, Other (non HMO) | Source: Ambulatory Visit | Attending: Family Medicine | Admitting: Family Medicine

## 2016-04-20 ENCOUNTER — Encounter (HOSPITAL_COMMUNITY): Payer: Self-pay

## 2016-04-20 DIAGNOSIS — Z1231 Encounter for screening mammogram for malignant neoplasm of breast: Secondary | ICD-10-CM

## 2016-04-21 ENCOUNTER — Encounter: Payer: Self-pay | Admitting: Family Medicine

## 2016-04-24 ENCOUNTER — Telehealth: Payer: Self-pay

## 2016-04-24 DIAGNOSIS — E739 Lactose intolerance, unspecified: Secondary | ICD-10-CM

## 2016-04-24 DIAGNOSIS — I10 Essential (primary) hypertension: Secondary | ICD-10-CM

## 2016-04-24 NOTE — Telephone Encounter (Signed)
-----  Message from Margaret E Simpson, MD sent at 04/19/2016  2:36 PM EDT ----- Regarding: pls mail lab order for first week in Sept, needs  Non fast hBA1C , chem 7 and EGFR (see result note if needed, sent to her) [place] 

## 2016-04-24 NOTE — Telephone Encounter (Signed)
First week in sept labs mailed

## 2016-07-03 ENCOUNTER — Other Ambulatory Visit: Payer: Self-pay

## 2016-07-03 ENCOUNTER — Telehealth: Payer: Self-pay | Admitting: Family Medicine

## 2016-07-03 MED ORDER — HYDROCHLOROTHIAZIDE 25 MG PO TABS: 25.0000 mg | ORAL_TABLET | Freq: Every day | ORAL | 1 refills | Status: DC

## 2016-07-03 NOTE — Telephone Encounter (Signed)
Left detailed voicemail for patient letting her know a request has been sent.

## 2016-07-03 NOTE — Telephone Encounter (Signed)
Patient is requesting a refill of  Hydrochlorothiazide   [facility]  Cb#: [id number]

## 2016-07-03 NOTE — Telephone Encounter (Signed)
Refill sent.

## 2016-07-23 ENCOUNTER — Telehealth: Payer: Self-pay | Admitting: *Deleted

## 2016-07-23 MED ORDER — POTASSIUM CHLORIDE CRYS ER 20 MEQ PO TBCR: 20.0000 meq | EXTENDED_RELEASE_TABLET | Freq: Every day | ORAL | 1 refills | Status: DC

## 2016-07-23 NOTE — Telephone Encounter (Signed)
Patient called requesting a 3 month supply on her potassium pills. Please advise

## 2016-09-25 ENCOUNTER — Telehealth: Payer: Self-pay

## 2016-09-25 ENCOUNTER — Telehealth: Payer: Self-pay | Admitting: Family Medicine

## 2016-09-25 NOTE — Telephone Encounter (Signed)
Left message informing that lab has bloodwork orders.

## 2016-09-25 NOTE — Telephone Encounter (Signed)
No additional note needed 

## 2016-09-26 ENCOUNTER — Other Ambulatory Visit: Payer: Self-pay | Admitting: Family Medicine

## 2016-09-28 LAB — HEMOGLOBIN A1C
Hgb A1c MFr Bld: 6.1 % of total Hgb — ABNORMAL HIGH (ref <5.7)
Mean Plasma Glucose: 128 (calc)
eAG (mmol/L): 7.1 (calc)

## 2016-09-28 LAB — BASIC METABOLIC PANEL WITH GFR
BUN: 14 mg/dL (ref 7–25)
CO2: 29 mmol/L (ref 20–32)
Calcium: 9.3 mg/dL (ref 8.6–10.4)
Chloride: 106 mmol/L (ref 98–110)
Creat: 0.73 mg/dL (ref 0.50–1.05)
GFR, Est African American: 104 mL/min/{1.73_m2} (ref >=60)
GFR, Est Non African American: 90 mL/min/{1.73_m2} (ref >=60)
Glucose, Bld: 87 mg/dL (ref 65–99)
Potassium: 3.8 mmol/L (ref 3.5–5.3)
Sodium: 140 mmol/L (ref 135–146)

## 2016-09-30 ENCOUNTER — Ambulatory Visit (INDEPENDENT_AMBULATORY_CARE_PROVIDER_SITE_OTHER): Payer: Managed Care, Other (non HMO) | Admitting: Family Medicine

## 2016-09-30 ENCOUNTER — Encounter: Payer: Self-pay | Admitting: Family Medicine

## 2016-09-30 VITALS — BP 110/70 | HR 64 | Temp 98.0°F | Resp 16 | Ht 67.0 in | Wt 188.2 lb

## 2016-09-30 DIAGNOSIS — Z0000 Encounter for general adult medical examination without abnormal findings: Secondary | ICD-10-CM

## 2016-09-30 DIAGNOSIS — M79646 Pain in unspecified finger(s): Secondary | ICD-10-CM | POA: Diagnosis not present

## 2016-09-30 DIAGNOSIS — Z1211 Encounter for screening for malignant neoplasm of colon: Secondary | ICD-10-CM | POA: Diagnosis not present

## 2016-09-30 LAB — HEMOCCULT GUIAC POC 1CARD (OFFICE): Fecal Occult Blood, POC: NEGATIVE

## 2016-09-30 NOTE — Patient Instructions (Addendum)
F/u early April, call if you need me before  Flu vaccine needed   Please take ibuprofen 200 mg TWO tablets every 8 to 12 hours for the next 3 to 5 days for your swollen , painful 5th finger  Additional labs will be added hepatic and lipid Please work on good  health habits so that your health will improve. 1. Commitment to daily physical activity for 30 to 60  minutes, if you are able to do this.  2. Commitment to wise food choices. Aim for half of your  food intake to be vegetable and fruit, one quarter starchy foods, and one quarter protein. Try to eat on a regular schedule  3 meals per day, snacking between meals should be limited to vegetables or fruits or small portions of nuts. 64 ounces of water per day is generally recommended, unless you have specific health conditions, like heart failure or kidney failure where you will need to limit fluid intake.  3. Commitment to sufficient and a  good quality of physical and mental rest daily, generally between 6 to 8 hours per day.  WITH PERSISTANCE AND PERSEVERANCE, THE IMPOSSIBLE , BECOMES THE NORM!   It is important that you exercise regularly at least 30 minutes 5 times a week. If you develop chest pain, have severe difficulty breathing, or feel very tired, stop exercising immediately and seek medical attention    CBC, fasting lipid, cmp and EGFR , hBA1C, TSH  March 31 or after

## 2016-09-30 NOTE — Assessment & Plan Note (Signed)
Annual exam as documented. Counseling done  re healthy lifestyle involving commitment to 150 minutes exercise per week, heart healthy diet, and attaining healthy weight.The importance of adequate sleep also discussed.  Immunization and cancer screening needs are specifically addressed at this visit.  

## 2016-10-01 LAB — BASIC METABOLIC PANEL WITH GFR
BUN: 14 mg/dL (ref 7–25)
CO2: 29 mmol/L (ref 20–32)
Calcium: 9.3 mg/dL (ref 8.6–10.4)
Chloride: 106 mmol/L (ref 98–110)
Creat: 0.73 mg/dL (ref 0.50–1.05)
GFR, Est African American: 104 mL/min/{1.73_m2} (ref >=60)
GFR, Est Non African American: 90 mL/min/{1.73_m2} (ref >=60)
Glucose, Bld: 87 mg/dL (ref 65–99)
Potassium: 3.8 mmol/L (ref 3.5–5.3)
Sodium: 140 mmol/L (ref 135–146)

## 2016-10-01 LAB — LIPID PANEL
Cholesterol: 182 mg/dL (ref <200)
HDL: 90 mg/dL (ref >50)
LDL Cholesterol (Calc): 81 mg/dL (calc)
Non-HDL Cholesterol (Calc): 92 mg/dL (calc) (ref <130)
Total CHOL/HDL Ratio: 2 (calc) (ref <5.0)
Triglycerides: 40 mg/dL (ref <150)

## 2016-10-01 LAB — TEST AUTHORIZATION

## 2016-10-01 LAB — HEMOGLOBIN A1C
Hgb A1c MFr Bld: 6.1 % of total Hgb — ABNORMAL HIGH (ref <5.7)
Mean Plasma Glucose: 128 (calc)
eAG (mmol/L): 7.1 (calc)

## 2016-10-02 DIAGNOSIS — M79646 Pain in unspecified finger(s): Secondary | ICD-10-CM | POA: Insufficient documentation

## 2016-10-02 NOTE — Assessment & Plan Note (Signed)
Ibuprofen 3 times daily for 3 days

## 2016-10-02 NOTE — Progress Notes (Signed)
    Megan Villa     MRN: [id number]      DOB: 16-Nov-1955  HPI: Patient is in for annual physical exam. C/o swollen painful 5th finger x 2 days Recent labs, if available are reviewed. Immunization is reviewed , she is planning to check on coverage for the vaccine before taking it.   PE: BP 110/70 (BP Location: Left Arm, Patient Position: Sitting, Cuff Size: Normal)   Pulse 64   Temp 98 F (36.7 C) (Other (Comment))   Resp 16   Ht  (1.702 m)   Wt 188 lb 4 oz (85.4 kg)   SpO2 98%   BMI 29.48 kg/m   Pleasant  female, alert and oriented x 3, in no cardio-pulmonary distress. Afebrile. HEENT No facial trauma or asymetry. Sinuses non tender.  Extra occullar muscles intact. External ears normal, tympanic membranes clear. Oropharynx moist, no exudate. Neck: supple, no adenopathy,JVD or thyromegaly.No bruits.  Chest: Clear to ascultation bilaterally.No crackles or wheezes. Non tender to palpation  Breast: No asymetry,no masses or lumps. No tenderness. No nipple discharge or inversion. No axillary or supraclavicular adenopathy  Cardiovascular system; Heart sounds normal,  S1 and  S2 ,no S3.  No murmur, or thrill. Apical beat not displaced Peripheral pulses normal.  Abdomen: Soft, non tender, no organomegaly or masses. No bruits. Bowel sounds normal. No guarding, tenderness or rebound.  Rectal:  Normal sphincter tone. No rectal mass. Guaiac negative stool.  GU: Asymptomatic,  Not examined   Musculoskeletal exam: Full ROM of spine, hips , shoulders and knees. No deformity ,swelling or crepitus noted. No muscle wasting or atrophy.  5ht finger tender and erythematous , limited movemnt  Neurologic: Cranial nerves 2 to 12 intact. Power, tone ,sensation and reflexes normal throughout. No disturbance in gait. No tremor.  Skin: Intact, no ulceration, erythema , scaling or rash noted. Pigmentation normal throughout  Psych; Normal mood and affect. Judgement  and concentration normal   Assessment & Plan:  Annual physical exam Annual exam as documented. Counseling done  re healthy lifestyle involving commitment to 150 minutes exercise per week, heart healthy diet, and attaining healthy weight.The importance of adequate sleep also discussed.  Immunization and cancer screening needs are specifically addressed at this visit.

## 2016-12-19 ENCOUNTER — Other Ambulatory Visit: Payer: Self-pay | Admitting: Family Medicine

## 2016-12-22 ENCOUNTER — Telehealth: Payer: Self-pay | Admitting: *Deleted

## 2016-12-22 NOTE — Telephone Encounter (Signed)
This has been done yesterday

## 2016-12-22 NOTE — Telephone Encounter (Signed)
Patient called requesting a refill on her lovastatin to [facility]. Please advise

## 2017-01-04 ENCOUNTER — Other Ambulatory Visit: Payer: Self-pay

## 2017-01-04 ENCOUNTER — Telehealth: Payer: Self-pay | Admitting: Family Medicine

## 2017-01-04 MED ORDER — HYDROCHLOROTHIAZIDE 25 MG PO TABS: 25.0000 mg | ORAL_TABLET | Freq: Every day | ORAL | 1 refills | Status: DC

## 2017-01-04 NOTE — Telephone Encounter (Signed)
Patient called in to request refill of hydrochlorothiazide  [facility] Cb#: [phone]

## 2017-01-04 NOTE — Telephone Encounter (Signed)
Done

## 2017-04-16 ENCOUNTER — Other Ambulatory Visit: Payer: Self-pay

## 2017-04-16 ENCOUNTER — Telehealth: Payer: Self-pay | Admitting: Family Medicine

## 2017-04-16 DIAGNOSIS — E739 Lactose intolerance, unspecified: Secondary | ICD-10-CM

## 2017-04-16 DIAGNOSIS — I10 Essential (primary) hypertension: Secondary | ICD-10-CM

## 2017-04-16 DIAGNOSIS — E785 Hyperlipidemia, unspecified: Secondary | ICD-10-CM

## 2017-04-16 DIAGNOSIS — E8881 Metabolic syndrome: Secondary | ICD-10-CM

## 2017-04-16 MED ORDER — POTASSIUM CHLORIDE CRYS ER 20 MEQ PO TBCR: 20.0000 meq | EXTENDED_RELEASE_TABLET | Freq: Every day | ORAL | 1 refills | Status: DC

## 2017-04-16 NOTE — Telephone Encounter (Signed)
Patient is requesting a refill for potassium chloride SA, [facility]  She also wants to know if she needs labs done for her appt next week.  Cb#: [phone]

## 2017-04-16 NOTE — Telephone Encounter (Signed)
Pt aware.

## 2017-04-19 ENCOUNTER — Other Ambulatory Visit: Payer: Self-pay | Admitting: Family Medicine

## 2017-04-19 DIAGNOSIS — Z1231 Encounter for screening mammogram for malignant neoplasm of breast: Secondary | ICD-10-CM

## 2017-04-19 DIAGNOSIS — I10 Essential (primary) hypertension: Secondary | ICD-10-CM | POA: Diagnosis not present

## 2017-04-19 DIAGNOSIS — E739 Lactose intolerance, unspecified: Secondary | ICD-10-CM | POA: Diagnosis not present

## 2017-04-19 DIAGNOSIS — E785 Hyperlipidemia, unspecified: Secondary | ICD-10-CM | POA: Diagnosis not present

## 2017-04-20 LAB — CBC
HCT: 37.2 % (ref 35.0–45.0)
Hemoglobin: 12.2 g/dL (ref 11.7–15.5)
MCH: 27.4 pg (ref 27.0–33.0)
MCHC: 32.8 g/dL (ref 32.0–36.0)
MCV: 83.4 fL (ref 80.0–100.0)
MPV: 11.7 fL (ref 7.5–12.5)
Platelets: 312 10*3/uL (ref 140–400)
RBC: 4.46 10*6/uL (ref 3.80–5.10)
RDW: 13 % (ref 11.0–15.0)
WBC: 4.8 10*3/uL (ref 3.8–10.8)

## 2017-04-20 LAB — COMPLETE METABOLIC PANEL WITH GFR
AG Ratio: 1.5 (calc) (ref 1.0–2.5)
ALT: 13 U/L (ref 6–29)
AST: 18 U/L (ref 10–35)
Albumin: 4.3 g/dL (ref 3.6–5.1)
Alkaline phosphatase (APISO): 79 U/L (ref 33–130)
BUN: 15 mg/dL (ref 7–25)
CO2: 30 mmol/L (ref 20–32)
Calcium: 9.5 mg/dL (ref 8.6–10.4)
Chloride: 104 mmol/L (ref 98–110)
Creat: 0.74 mg/dL (ref 0.50–0.99)
GFR, Est African American: 102 mL/min/{1.73_m2} (ref >=60)
GFR, Est Non African American: 88 mL/min/{1.73_m2} (ref >=60)
Globulin: 2.9 g/dL (calc) (ref 1.9–3.7)
Glucose, Bld: 106 mg/dL — ABNORMAL HIGH (ref 65–99)
Potassium: 4.1 mmol/L (ref 3.5–5.3)
Sodium: 140 mmol/L (ref 135–146)
Total Bilirubin: 0.3 mg/dL (ref 0.2–1.2)
Total Protein: 7.2 g/dL (ref 6.1–8.1)

## 2017-04-20 LAB — LIPID PANEL
Cholesterol: 232 mg/dL — ABNORMAL HIGH (ref <200)
HDL: 79 mg/dL (ref >50)
LDL Cholesterol (Calc): 139 mg/dL (calc) — ABNORMAL HIGH
Non-HDL Cholesterol (Calc): 153 mg/dL (calc) — ABNORMAL HIGH (ref <130)
Total CHOL/HDL Ratio: 2.9 (calc) (ref <5.0)
Triglycerides: 53 mg/dL (ref <150)

## 2017-04-20 LAB — HEMOGLOBIN A1C
Hgb A1c MFr Bld: 6.1 % of total Hgb — ABNORMAL HIGH (ref <5.7)
Mean Plasma Glucose: 128 (calc)
eAG (mmol/L): 7.1 (calc)

## 2017-04-20 LAB — TSH: TSH: 3.45 mIU/L (ref 0.40–4.50)

## 2017-04-21 ENCOUNTER — Encounter: Payer: Self-pay | Admitting: Family Medicine

## 2017-04-21 ENCOUNTER — Ambulatory Visit (INDEPENDENT_AMBULATORY_CARE_PROVIDER_SITE_OTHER): Payer: BLUE CROSS/BLUE SHIELD | Admitting: Family Medicine

## 2017-04-21 VITALS — BP 122/80 | HR 81 | Resp 16 | Ht 67.0 in | Wt 185.0 lb

## 2017-04-21 DIAGNOSIS — E663 Overweight: Secondary | ICD-10-CM

## 2017-04-21 DIAGNOSIS — J302 Other seasonal allergic rhinitis: Secondary | ICD-10-CM | POA: Diagnosis not present

## 2017-04-21 DIAGNOSIS — I10 Essential (primary) hypertension: Secondary | ICD-10-CM | POA: Diagnosis not present

## 2017-04-21 DIAGNOSIS — N814 Uterovaginal prolapse, unspecified: Secondary | ICD-10-CM

## 2017-04-21 DIAGNOSIS — E785 Hyperlipidemia, unspecified: Secondary | ICD-10-CM

## 2017-04-21 DIAGNOSIS — E739 Lactose intolerance, unspecified: Secondary | ICD-10-CM | POA: Diagnosis not present

## 2017-04-21 MED ORDER — FLUTICASONE PROPIONATE 50 MCG/ACT NA SUSP: 1.0000 | Freq: Every day | NASAL | 3 refills | Status: DC

## 2017-04-21 NOTE — Patient Instructions (Addendum)
Annual physical exam early October, call if you need  Me sooner  Please commit to 30 minutes exercise 6 days  Per week and change in food choice for improved health as we discussed, we can`not make a red trash can blue!  Fasting lipid, cmp and EGFr and hBa1c and vit D end September  Medication sent for allergies  You are referred to Dr Glo Herring  Blood pressure is excellent Thank you  for choosing [facility]. We consider it a privelige to serve you.  Delivering excellent health care in a caring and  compassionate way is our goal.  Partnering with you,  so that together we can achieve this goal is our strategy.

## 2017-04-22 ENCOUNTER — Encounter: Payer: Self-pay | Admitting: Family Medicine

## 2017-04-22 NOTE — Assessment & Plan Note (Signed)
Increased symptoms , refer back  to gyne for fitting of pessary

## 2017-04-22 NOTE — Assessment & Plan Note (Signed)
Controlled, no change in medication DASH diet and commitment to daily physical activity for a minimum of 30 minutes discussed and encouraged, as a part of hypertension management. The importance of attaining a healthy weight is also discussed.  BP/Weight 04/21/2017 09/30/2016 04/01/2016 09/11/2015 04/08/2015 10/02/2014 05/30/2014  Systolic BP 122 110 114 120 120 120 122  Diastolic BP 80 70 64 80 74 64 78  Wt. (Lbs) 185 188.25 182 178 171 165 162.4  BMI 28.98 29.48 28.51 27.88 26.78 25.84 25.43

## 2017-04-22 NOTE — Assessment & Plan Note (Signed)
Deteriorated. Patient re-educated about  the importance of commitment to a  minimum of 150 minutes of exercise per week.  The importance of healthy food choices with portion control discussed. Encouraged to start a food diary, count calories and to consider  joining a support group. Sample diet sheets offered. Goals set by the patient for the next several months.   Weight /BMI 04/21/2017 09/30/2016 04/01/2016  WEIGHT 185 lb 188 lb 4 oz 182 lb  HEIGHT 5\' 7"  5\' 7"  5\' 7"   BMI 28.98 kg/m2 29.48 kg/m2 28.51 kg/m2

## 2017-04-22 NOTE — Progress Notes (Signed)
Megan RaymondDiane Villa     MRN: [id number]      DOB: 09-19-1955   HPI Megan Villa is here for follow up and re-evaluation of chronic medical conditions, medication management and review of any available recent lab and radiology data.  Preventive health is updated, specifically  Cancer screening and Immunization.   Requests that she be referred to gyne once more because of uterine prolapse, states at the time she could not afford to follow through with purchase of pessary, however, now she needs this as her symptoms are worse and she will get the money together The PT denies any adverse reactions to current medications since the last visit.  Last week c/o light headedness and mild dizziness for 1 day, she thinks it may have been related to uncontrolled allergy symptoms , she is uncertain, she describes heaviness in the head and mild vertigo, lasted for 1 day, she just stayed home but she took no medication, and her symptoms cleared. She is now experiencing increased allergy symptoms of clear nasal drainage and post nasal drainage with cough , she denies fever and chills, this is common for this Season She has not yet committed to more than 3 days per wek of exercise, knows that she needs to and says that she will, also will be more intentional about food choice for improved health  ROS Denies recent fever or chills. Denies , nasal congestion, ear pain or sore throat. Denies chest congestion, productive cough or wheezing. Denies chest pains, palpitations and leg swelling Denies abdominal pain, nausea, vomiting,diarrhea or constipation.   Denies dysuria, frequency, hesitancy or incontinence. Denies joint pain, swelling and limitation in mobility. Denies headaches, seizures, numbness, or tingling. Denies depression, anxiety or insomnia. Denies skin break down or rash.   PE  BP 122/80   Pulse 81   Resp 16   Ht 5\' 7"  (1.702 m)   Wt 185 lb (83.9 kg)   SpO2 98%   BMI 28.98 kg/m   Patient alert  and oriented and in no cardiopulmonary distress.  HEENT: No facial asymmetry, EOMI,   oropharynx pink and moist.  Neck supple no JVD, no mass.No sinus tenderness, erythema and edema of nasal mucosa with clear drainage  Chest: Clear to auscultation bilaterally.  CVS: S1, S2 no murmurs, no S3.Regular rate.  ABD: Soft non tender.   Ext: No edema  MS: Adequate ROM spine, shoulders, hips and knees.  Skin: Intact, no ulcerations or rash noted.  Psych: Good eye contact, normal affect. Memory intact not anxious or depressed appearing.  CNS: CN 2-12 intact, power,  normal throughout.no focal deficits noted.   Assessment & Plan  Essential hypertension Controlled, no change in medication DASH diet and commitment to daily physical activity for a minimum of 30 minutes discussed and encouraged, as a part of hypertension management. The importance of attaining a healthy weight is also discussed.  BP/Weight 04/21/2017 09/30/2016 04/01/2016 09/11/2015 04/08/2015 10/02/2014 05/30/2014  Systolic BP 122 110 114 120 120 120 122  Diastolic BP 80 70 64 80 74 64 78  Wt. (Lbs) 185 188.25 182 178 171 165 162.4  BMI 28.98 29.48 28.51 27.88 26.78 25.84 25.43       IMPAIRED GLUCOSE TOLERANCE Patient educated about the importance of limiting  Carbohydrate intake , the need to commit to daily physical activity for a minimum of 30 minutes , and to commit weight loss. The fact that changes in all these areas will reduce or eliminate all together the development of  diabetes is stressed.  Unchanged, needs to commit n mp ore to lifestyle change to correct this  Diabetic Labs Latest Ref Rng & Units 04/19/2017 09/26/2016 09/26/2016 04/17/2016 10/18/2015  HbA1c <5.7 % of total Hgb 6.1(H) 6.1(H) 6.1(H) 5.9(H) 5.9(H)  Chol <200 mg/dL 295(A) - 213 086 578  HDL >50 mg/dL 79 - 90 83 75  Calc LDL mg/dL (calc) 469(G) - 81 78 81  Triglycerides <150 mg/dL 53 - 40 46 28  Creatinine 0.50 - 0.99 mg/dL 2.95 2.84 1.32 4.40 1.02    BP/Weight 04/21/2017 09/30/2016 04/01/2016 09/11/2015 04/08/2015 10/02/2014 05/30/2014  Systolic BP 122 110 114 120 120 120 122  Diastolic BP 80 70 64 80 74 64 78  Wt. (Lbs) 185 188.25 182 178 171 165 162.4  BMI 28.98 29.48 28.51 27.88 26.78 25.84 25.43   No flowsheet data found.    Overweight (BMI 25.0-29.9) Deteriorated. Patient re-educated about  the importance of commitment to a  minimum of 150 minutes of exercise per week.  The importance of healthy food choices with portion control discussed. Encouraged to start a food diary, count calories and to consider  joining a support group. Sample diet sheets offered. Goals set by the patient for the next several months.   Weight /BMI 04/21/2017 09/30/2016 04/01/2016  WEIGHT 185 lb 188 lb 4 oz 182 lb  HEIGHT 5\' 7"  5\' 7"  5\' 7"   BMI 28.98 kg/m2 29.48 kg/m2 28.51 kg/m2      Hyperlipidemia LDL goal <100 Hyperlipidemia:Low fat diet discussed and encouraged.   Lipid Panel  Lab Results  Component Value Date   CHOL 232 (H) 04/19/2017   HDL 79 04/19/2017   LDLCALC 139 (H) 04/19/2017   TRIG 53 04/19/2017   CHOLHDL 2.9 04/19/2017   Not at goal, needs to lower fat in diet, no med change    Seasonal allergies incrossed and uncontrolled symptoms , needs to start daily meds  Uterine prolapse Increased symptoms , refer back  to gyne for fitting of pessary

## 2017-04-22 NOTE — Assessment & Plan Note (Signed)
Patient educated about the importance of limiting  Carbohydrate intake , the need to commit to daily physical activity for a minimum of 30 minutes , and to commit weight loss. The fact that changes in all these areas will reduce or eliminate all together the development of diabetes is stressed.  Unchanged, needs to commit n mp ore to lifestyle change to correct this  Diabetic Labs Latest Ref Rng & Units 04/19/2017 09/26/2016 09/26/2016 04/17/2016 10/18/2015  HbA1c <5.7 % of total Hgb 6.1(H) 6.1(H) 6.1(H) 5.9(H) 5.9(H)  Chol <200 mg/dL 161(W232(H) - 960182 454170 098162  HDL >50 mg/dL 79 - 90 83 75  Calc LDL mg/dL (calc) 119(J139(H) - 81 78 81  Triglycerides <150 mg/dL 53 - 40 46 28  Creatinine 0.50 - 0.99 mg/dL 4.780.74 2.950.73 6.210.73 3.080.68 6.570.65   BP/Weight 04/21/2017 09/30/2016 04/01/2016 09/11/2015 04/08/2015 10/02/2014 05/30/2014  Systolic BP 122 110 114 120 120 120 122  Diastolic BP 80 70 64 80 74 64 78  Wt. (Lbs) 185 188.25 182 178 171 165 162.4  BMI 28.98 29.48 28.51 27.88 26.78 25.84 25.43   No flowsheet data found.

## 2017-04-22 NOTE — Assessment & Plan Note (Signed)
incrossed and uncontrolled symptoms , needs to start daily meds

## 2017-04-22 NOTE — Assessment & Plan Note (Signed)
Hyperlipidemia:Low fat diet discussed and encouraged.   Lipid Panel  Lab Results  Component Value Date   CHOL 232 (H) 04/19/2017   HDL 79 04/19/2017   LDLCALC 139 (H) 04/19/2017   TRIG 53 04/19/2017   CHOLHDL 2.9 04/19/2017   Not at goal, needs to lower fat in diet, no med change

## 2017-04-25 ENCOUNTER — Encounter: Payer: Self-pay | Admitting: Family Medicine

## 2017-04-26 ENCOUNTER — Encounter: Payer: Self-pay | Admitting: Obstetrics and Gynecology

## 2017-04-26 ENCOUNTER — Ambulatory Visit (HOSPITAL_COMMUNITY)
Admission: RE | Admit: 2017-04-26 | Discharge: 2017-04-26 | Disposition: A | Discharge status: Home / Self Care | Payer: BLUE CROSS/BLUE SHIELD | Source: Ambulatory Visit | Attending: Family Medicine | Admitting: Family Medicine

## 2017-04-26 DIAGNOSIS — Z1231 Encounter for screening mammogram for malignant neoplasm of breast: Secondary | ICD-10-CM | POA: Diagnosis not present

## 2017-05-03 ENCOUNTER — Encounter: Payer: Self-pay | Admitting: Family Medicine

## 2017-05-19 ENCOUNTER — Encounter: Payer: Self-pay | Admitting: Obstetrics and Gynecology

## 2017-07-02 ENCOUNTER — Other Ambulatory Visit: Payer: Self-pay | Admitting: Family Medicine

## 2017-07-05 ENCOUNTER — Telehealth: Payer: Self-pay | Admitting: Family Medicine

## 2017-07-05 NOTE — Telephone Encounter (Signed)
This has been sent to [facility] in [place]

## 2017-07-05 NOTE — Telephone Encounter (Signed)
Pt is calling needs her BP meds called in --She is out  [facility]

## 2017-07-05 NOTE — Telephone Encounter (Signed)
Pt aware.

## 2017-07-05 NOTE — Telephone Encounter (Signed)
Done

## 2017-07-05 NOTE — Telephone Encounter (Signed)
Patient left message requesting refill for hydrochlorothiazide. Says she is completely out. [facility] in [place].

## 2017-09-13 ENCOUNTER — Other Ambulatory Visit: Payer: Self-pay

## 2017-09-13 ENCOUNTER — Telehealth: Payer: Self-pay | Admitting: Family Medicine

## 2017-09-13 MED ORDER — LOVASTATIN 40 MG PO TABS: 40.0000 mg | ORAL_TABLET | Freq: Every day | ORAL | 1 refills | Status: DC

## 2017-09-13 NOTE — Telephone Encounter (Signed)
lovastatin (MEVACOR) 40 MG tablet--3 month supply to [facility]

## 2017-10-15 ENCOUNTER — Other Ambulatory Visit: Payer: Self-pay | Admitting: Family Medicine

## 2017-10-18 ENCOUNTER — Telehealth: Payer: Self-pay | Admitting: Family Medicine

## 2017-10-18 ENCOUNTER — Telehealth: Payer: Self-pay

## 2017-10-18 DIAGNOSIS — E739 Lactose intolerance, unspecified: Secondary | ICD-10-CM

## 2017-10-18 DIAGNOSIS — I10 Essential (primary) hypertension: Secondary | ICD-10-CM

## 2017-10-18 DIAGNOSIS — E785 Hyperlipidemia, unspecified: Secondary | ICD-10-CM

## 2017-10-18 DIAGNOSIS — E8881 Metabolic syndrome: Secondary | ICD-10-CM

## 2017-10-18 NOTE — Telephone Encounter (Signed)
Pt LVM that she needed her Klor-con called into [facility] --thanks

## 2017-10-18 NOTE — Telephone Encounter (Signed)
Labs are in the system

## 2017-10-18 NOTE — Telephone Encounter (Signed)
Spoke with patient. She did not need Klor Con called in. She wanted to know if she needed labs before her upcoming appt. Labs ordered for patient and she is aware they are fasting.

## 2017-10-18 NOTE — Telephone Encounter (Signed)
Labs ordered.

## 2017-10-19 DIAGNOSIS — I10 Essential (primary) hypertension: Secondary | ICD-10-CM | POA: Diagnosis not present

## 2017-10-19 DIAGNOSIS — E739 Lactose intolerance, unspecified: Secondary | ICD-10-CM | POA: Diagnosis not present

## 2017-10-19 DIAGNOSIS — E785 Hyperlipidemia, unspecified: Secondary | ICD-10-CM | POA: Diagnosis not present

## 2017-10-19 DIAGNOSIS — E8881 Metabolic syndrome: Secondary | ICD-10-CM | POA: Diagnosis not present

## 2017-10-20 ENCOUNTER — Encounter: Payer: Self-pay | Admitting: Family Medicine

## 2017-10-20 LAB — COMPLETE METABOLIC PANEL WITH GFR
AG Ratio: 1.8 (calc) (ref 1.0–2.5)
ALT: 14 U/L (ref 6–29)
AST: 20 U/L (ref 10–35)
Albumin: 4.1 g/dL (ref 3.6–5.1)
Alkaline phosphatase (APISO): 76 U/L (ref 33–130)
BUN: 14 mg/dL (ref 7–25)
CO2: 29 mmol/L (ref 20–32)
Calcium: 9.1 mg/dL (ref 8.6–10.4)
Chloride: 104 mmol/L (ref 98–110)
Creat: 0.77 mg/dL (ref 0.50–0.99)
GFR, Est African American: 97 mL/min/{1.73_m2} (ref >=60)
GFR, Est Non African American: 84 mL/min/{1.73_m2} (ref >=60)
Globulin: 2.3 g/dL (calc) (ref 1.9–3.7)
Glucose, Bld: 103 mg/dL — ABNORMAL HIGH (ref 65–99)
Potassium: 3.8 mmol/L (ref 3.5–5.3)
Sodium: 140 mmol/L (ref 135–146)
Total Bilirubin: 0.4 mg/dL (ref 0.2–1.2)
Total Protein: 6.4 g/dL (ref 6.1–8.1)

## 2017-10-20 LAB — LIPID PANEL
Cholesterol: 183 mg/dL (ref <200)
HDL: 70 mg/dL (ref >50)
LDL Cholesterol (Calc): 101 mg/dL (calc) — ABNORMAL HIGH
Non-HDL Cholesterol (Calc): 113 mg/dL (calc) (ref <130)
Total CHOL/HDL Ratio: 2.6 (calc) (ref <5.0)
Triglycerides: 42 mg/dL (ref <150)

## 2017-10-20 LAB — HEMOGLOBIN A1C
Hgb A1c MFr Bld: 6.2 % of total Hgb — ABNORMAL HIGH (ref <5.7)
Mean Plasma Glucose: 131 (calc)
eAG (mmol/L): 7.3 (calc)

## 2017-10-20 LAB — VITAMIN D 25 HYDROXY (VIT D DEFICIENCY, FRACTURES): Vit D, 25-Hydroxy: 16 ng/mL — ABNORMAL LOW (ref 30–100)

## 2017-10-21 ENCOUNTER — Encounter: Payer: Self-pay | Admitting: Family Medicine

## 2017-10-21 ENCOUNTER — Ambulatory Visit (INDEPENDENT_AMBULATORY_CARE_PROVIDER_SITE_OTHER): Payer: BLUE CROSS/BLUE SHIELD | Admitting: Family Medicine

## 2017-10-21 ENCOUNTER — Other Ambulatory Visit: Payer: Self-pay

## 2017-10-21 VITALS — BP 128/70 | HR 87 | Resp 12 | Ht 67.0 in | Wt 184.1 lb

## 2017-10-21 DIAGNOSIS — I10 Essential (primary) hypertension: Secondary | ICD-10-CM

## 2017-10-21 DIAGNOSIS — E739 Lactose intolerance, unspecified: Secondary | ICD-10-CM

## 2017-10-21 DIAGNOSIS — E785 Hyperlipidemia, unspecified: Secondary | ICD-10-CM

## 2017-10-21 DIAGNOSIS — Z0000 Encounter for general adult medical examination without abnormal findings: Secondary | ICD-10-CM | POA: Diagnosis not present

## 2017-10-21 DIAGNOSIS — E559 Vitamin D deficiency, unspecified: Secondary | ICD-10-CM

## 2017-10-21 DIAGNOSIS — E8881 Metabolic syndrome: Secondary | ICD-10-CM

## 2017-10-21 DIAGNOSIS — Z1211 Encounter for screening for malignant neoplasm of colon: Secondary | ICD-10-CM

## 2017-10-21 MED ORDER — ERGOCALCIFEROL 1.25 MG (50000 UT) PO CAPS: 50000.0000 [IU] | ORAL_CAPSULE | ORAL | 1 refills | Status: DC

## 2017-10-21 MED ORDER — LOVASTATIN 40 MG PO TABS: 40.0000 mg | ORAL_TABLET | Freq: Every day | ORAL | 2 refills | Status: DC

## 2017-10-21 MED ORDER — POTASSIUM CHLORIDE CRYS ER 20 MEQ PO TBCR: 20.0000 meq | EXTENDED_RELEASE_TABLET | Freq: Every day | ORAL | 2 refills | Status: DC

## 2017-10-21 MED ORDER — HYDROCHLOROTHIAZIDE 25 MG PO TABS: 25.0000 mg | ORAL_TABLET | Freq: Every day | ORAL | 2 refills | Status: DC

## 2017-10-21 NOTE — Patient Instructions (Addendum)
F/U in 6 months, call if you need me before  Please get flu  Vaccine   Reduce ice cream, mindful plant based eating  You are referred for colonoscopy to Dr Rourke/[facility]  Stop aspiriin  Fasting lipid, cmp and eGFR, HBA1`C, CBC, TSH and Vit D last week in March  New is once weekly vit D for 6 month  Commit to calcium 1000 to 1200 mg once daily  It is important that you exercise regularly at least 30 minutes 5 times a week. If you develop chest pain, have severe difficulty breathing, or feel very tired, stop exercising immediately and seek medical attention    Pls check insurance re shingles vaccine which you need 2 doses of

## 2017-10-23 ENCOUNTER — Encounter: Payer: Self-pay | Admitting: Family Medicine

## 2017-10-23 NOTE — Assessment & Plan Note (Signed)

## 2017-10-23 NOTE — Progress Notes (Signed)
    Megan Villa     MRN: [id number]      DOB: Feb 20, 1955  HPI: Patient is in for annual physical exam. No other health concerns are expressed or addressed at the visit. Recent labs,  are reviewed. Immunization is reviewed , and  Pt is to have flu vaccine at work and will check on shingles coverage  PE: BP 128/70 (BP Location: Left Arm, Patient Position: Sitting, Cuff Size: Large)   Pulse 87   Resp 12   Ht 5\' 7"  (1.702 m)   Wt 184 lb 1.9 oz (83.5 kg)   SpO2 97% Comment: room air  BMI 28.84 kg/m  Pleasant  female, alert and oriented x 3, in no cardio-pulmonary distress. Afebrile. HEENT No facial trauma or asymetry. Sinuses non tender.  Extra occullar muscles intact, pupils equally reactive to light. External ears normal, tympanic membranes clear. Oropharynx moist, no exudate. Neck: supple, no adenopathy,JVD or thyromegaly.No bruits.  Chest: Clear to ascultation bilaterally.No crackles or wheezes. Non tender to palpation  Breast: No asymetry,no masses or lumps. No tenderness. No nipple discharge or inversion. No axillary or supraclavicular adenopathy  Cardiovascular system; Heart sounds normal,  S1 and  S2 ,no S3.  No murmur, or thrill. Apical beat not displaced Peripheral pulses normal.  Abdomen: Soft, non tender, no organomegaly or masses. No bruits. Bowel sounds normal. No guarding, tenderness or rebound.  Rectal:  Needs colonoscopy and is referred GU: No exam, no compliant and pap not due  Musculoskeletal exam: Full ROM of spine, hips , shoulders and knees. No deformity ,swelling or crepitus noted. No muscle wasting or atrophy.   Neurologic: Cranial nerves 2 to 12 intact. Power, tone ,sensation and reflexes normal throughout. No disturbance in gait. No tremor.  Skin: Intact, no ulceration, erythema , scaling or rash noted. Pigmentation normal throughout  Psych; Normal mood and affect. Judgement and concentration normal   Assessment & Plan:    Annual physical exam Annual exam as documented. Counseling done  re healthy lifestyle involving commitment to 150 minutes exercise per week, heart healthy diet, and attaining healthy weight.The importance of adequate sleep also discussed. Regular seat belt use and home safety, is also discussed. Changes in health habits are decided on by the patient with goals and time frames  set for achieving them. Immunization and cancer screening needs are specifically addressed at this visit.

## 2017-11-16 DIAGNOSIS — Z23 Encounter for immunization: Secondary | ICD-10-CM | POA: Diagnosis not present

## 2018-01-10 ENCOUNTER — Telehealth: Payer: Self-pay | Admitting: *Deleted

## 2018-01-10 NOTE — Telephone Encounter (Signed)
Pt called said she has been taking Vitamin D and she has one more refill. Wanted to know should she get his filled or not. Dr. Lodema HongSimpson told her to take for 8 weeks and she thinks she has taken 8 weeks worth.

## 2018-03-14 ENCOUNTER — Telehealth: Payer: Self-pay | Admitting: Family Medicine

## 2018-03-14 NOTE — Telephone Encounter (Signed)
This patient doesn't take xanax. I only see it on her list from 2016.

## 2018-03-14 NOTE — Telephone Encounter (Signed)
Pt LVM that she needs a refill on her Xanax

## 2018-04-05 ENCOUNTER — Telehealth: Payer: Self-pay | Admitting: Family Medicine

## 2018-04-05 ENCOUNTER — Other Ambulatory Visit: Payer: Self-pay

## 2018-04-05 MED ORDER — POTASSIUM CHLORIDE CRYS ER 20 MEQ PO TBCR: 20.0000 meq | EXTENDED_RELEASE_TABLET | Freq: Every day | ORAL | 2 refills | Status: DC

## 2018-04-05 NOTE — Telephone Encounter (Signed)
Pt needs potassium chloride SA (K-DUR,KLOR-CON) 20 MEQ tablet sent in to [facility]

## 2018-04-05 NOTE — Telephone Encounter (Signed)
Med sent in.

## 2018-04-18 ENCOUNTER — Telehealth: Payer: Self-pay | Admitting: *Deleted

## 2018-04-18 NOTE — Telephone Encounter (Signed)
Called and left message to get labs done a day or two before her tele visit

## 2018-04-18 NOTE — Telephone Encounter (Signed)
Pt called has an appt on Thursday 4/2 wanted to know if she should get her blood work done before this appt. I did let her know that as of right now we are doing phone visits. She just wanted to know if the blood work should be done beforehand

## 2018-04-20 DIAGNOSIS — E559 Vitamin D deficiency, unspecified: Secondary | ICD-10-CM | POA: Diagnosis not present

## 2018-04-20 DIAGNOSIS — E739 Lactose intolerance, unspecified: Secondary | ICD-10-CM | POA: Diagnosis not present

## 2018-04-20 DIAGNOSIS — E8881 Metabolic syndrome: Secondary | ICD-10-CM | POA: Diagnosis not present

## 2018-04-20 DIAGNOSIS — I10 Essential (primary) hypertension: Secondary | ICD-10-CM | POA: Diagnosis not present

## 2018-04-20 DIAGNOSIS — E785 Hyperlipidemia, unspecified: Secondary | ICD-10-CM | POA: Diagnosis not present

## 2018-04-21 ENCOUNTER — Ambulatory Visit: Payer: BLUE CROSS/BLUE SHIELD | Admitting: Family Medicine

## 2018-04-21 ENCOUNTER — Other Ambulatory Visit: Payer: Self-pay

## 2018-04-21 ENCOUNTER — Ambulatory Visit (INDEPENDENT_AMBULATORY_CARE_PROVIDER_SITE_OTHER): Payer: BLUE CROSS/BLUE SHIELD | Admitting: Family Medicine

## 2018-04-21 ENCOUNTER — Encounter: Payer: Self-pay | Admitting: Family Medicine

## 2018-04-21 VITALS — BP 130/70 | Ht 67.0 in | Wt 185.0 lb

## 2018-04-21 DIAGNOSIS — J302 Other seasonal allergic rhinitis: Secondary | ICD-10-CM

## 2018-04-21 DIAGNOSIS — E663 Overweight: Secondary | ICD-10-CM

## 2018-04-21 DIAGNOSIS — E785 Hyperlipidemia, unspecified: Secondary | ICD-10-CM | POA: Diagnosis not present

## 2018-04-21 DIAGNOSIS — E8881 Metabolic syndrome: Secondary | ICD-10-CM

## 2018-04-21 DIAGNOSIS — Z1231 Encounter for screening mammogram for malignant neoplasm of breast: Secondary | ICD-10-CM | POA: Diagnosis not present

## 2018-04-21 DIAGNOSIS — I10 Essential (primary) hypertension: Secondary | ICD-10-CM | POA: Diagnosis not present

## 2018-04-21 LAB — COMPLETE METABOLIC PANEL WITH GFR
AG Ratio: 1.7 (calc) (ref 1.0–2.5)
ALT: 9 U/L (ref 6–29)
AST: 16 U/L (ref 10–35)
Albumin: 4.2 g/dL (ref 3.6–5.1)
Alkaline phosphatase (APISO): 70 U/L (ref 37–153)
BUN: 14 mg/dL (ref 7–25)
CO2: 28 mmol/L (ref 20–32)
Calcium: 9 mg/dL (ref 8.6–10.4)
Chloride: 107 mmol/L (ref 98–110)
Creat: 0.72 mg/dL (ref 0.50–0.99)
GFR, Est African American: 105 mL/min/{1.73_m2} (ref >=60)
GFR, Est Non African American: 90 mL/min/{1.73_m2} (ref >=60)
Globulin: 2.5 g/dL (calc) (ref 1.9–3.7)
Glucose, Bld: 102 mg/dL — ABNORMAL HIGH (ref 65–99)
Potassium: 3.6 mmol/L (ref 3.5–5.3)
Sodium: 142 mmol/L (ref 135–146)
Total Bilirubin: 0.4 mg/dL (ref 0.2–1.2)
Total Protein: 6.7 g/dL (ref 6.1–8.1)

## 2018-04-21 LAB — CBC
HCT: 39.6 % (ref 35.0–45.0)
Hemoglobin: 12.6 g/dL (ref 11.7–15.5)
MCH: 27.1 pg (ref 27.0–33.0)
MCHC: 31.8 g/dL — ABNORMAL LOW (ref 32.0–36.0)
MCV: 85.2 fL (ref 80.0–100.0)
MPV: 11.4 fL (ref 7.5–12.5)
Platelets: 328 10*3/uL (ref 140–400)
RBC: 4.65 10*6/uL (ref 3.80–5.10)
RDW: 13.1 % (ref 11.0–15.0)
WBC: 4.2 10*3/uL (ref 3.8–10.8)

## 2018-04-21 LAB — LIPID PANEL
Cholesterol: 201 mg/dL — ABNORMAL HIGH (ref <200)
HDL: 71 mg/dL (ref >=50)
LDL Cholesterol (Calc): 115 mg/dL (calc) — ABNORMAL HIGH
Non-HDL Cholesterol (Calc): 130 mg/dL (calc) — ABNORMAL HIGH (ref <130)
Total CHOL/HDL Ratio: 2.8 (calc) (ref <5.0)
Triglycerides: 60 mg/dL (ref <150)

## 2018-04-21 LAB — TSH: TSH: 3.49 mIU/L (ref 0.40–4.50)

## 2018-04-21 LAB — HEMOGLOBIN A1C
Hgb A1c MFr Bld: 6.3 % of total Hgb — ABNORMAL HIGH (ref <5.7)
Mean Plasma Glucose: 134 (calc)
eAG (mmol/L): 7.4 (calc)

## 2018-04-21 LAB — VITAMIN D 25 HYDROXY (VIT D DEFICIENCY, FRACTURES): Vit D, 25-Hydroxy: 31 ng/mL (ref 30–100)

## 2018-04-21 MED ORDER — POTASSIUM CHLORIDE CRYS ER 20 MEQ PO TBCR: 20.0000 meq | EXTENDED_RELEASE_TABLET | Freq: Every day | ORAL | 2 refills | Status: DC

## 2018-04-21 MED ORDER — LOVASTATIN 40 MG PO TABS: 40.0000 mg | ORAL_TABLET | Freq: Every day | ORAL | 2 refills | Status: DC

## 2018-04-21 MED ORDER — CLOTRIMAZOLE-BETAMETHASONE 1-0.05 % EX CREA: 1.0000 "application " | TOPICAL_CREAM | Freq: Two times a day (BID) | CUTANEOUS | 1 refills | Status: DC

## 2018-04-21 MED ORDER — FLUTICASONE PROPIONATE 50 MCG/ACT NA SUSP: 1.0000 | Freq: Every day | NASAL | 3 refills | Status: DC

## 2018-04-21 MED ORDER — ERGOCALCIFEROL 1.25 MG (50000 UT) PO CAPS: 50000.0000 [IU] | ORAL_CAPSULE | ORAL | 1 refills | Status: DC

## 2018-04-21 MED ORDER — ERGOCALCIFEROL 1.25 MG (50000 UT) PO CAPS: 50000.0000 [IU] | ORAL_CAPSULE | ORAL | 0 refills | Status: DC

## 2018-04-21 MED ORDER — HYDROCHLOROTHIAZIDE 25 MG PO TABS: 25.0000 mg | ORAL_TABLET | Freq: Every day | ORAL | 2 refills | Status: DC

## 2018-04-21 NOTE — Assessment & Plan Note (Signed)
Increased symptoms though not uncontrolled, has Flonase which she uses as needed

## 2018-04-21 NOTE — Assessment & Plan Note (Signed)
Controlled, no change in medication DASH diet and commitment to daily physical activity for a minimum of 30 minutes discussed and encouraged, as a part of hypertension management. The importance of attaining a healthy weight is also discussed.  BP/Weight 04/21/2018 10/21/2017 04/21/2017 09/30/2016 04/01/2016 09/11/2015 04/08/2015  Systolic BP 130 128 122 110 114 120 120  Diastolic BP 70 70 80 70 64 80 74  Wt. (Lbs) 185 184.12 185 188.25 182 178 171  BMI 28.98 28.84 28.98 29.48 28.51 27.88 26.78

## 2018-04-21 NOTE — Patient Instructions (Addendum)
Physical exam with pap and flu vaccine early Septembert, call if you need me sooner  Mammogram is scheduled as requested late PM after 3:30 and card enclosed  Please get fasting lipid , cmp and EGFr and HBA1C 1 week before next visit   Excellent blood count, liver , kidney and thyroid function  PLEASE work on improving health habits or daily exercise and healthy food choices, so that your cholesterol and blood sugar will improve, and you willl be healthier  After the next 12 weeks of vitamin D you do not need to take that anymore Please do start once daily calcium 1253m with vit D1000IU as we discussed  Think about what you will eat, plan ahead. Choose " clean, green, fresh or frozen" over canned, processed or packaged foods which are more sugary, salty and fatty. 70 to 75% of food eaten should be vegetables and fruit. Three meals at set times with snacks allowed between meals, but they must be fruit or vegetables. Aim to eat over a 12 hour period , example 7 am to 7 pm, and STOP after  your last meal of the day. Drink water,generally about 64 ounces per day, no other drink is as healthy. Fruit juice is best enjoyed in a healthy way, by EATING the fruit.  Social distancing. Frequent hand washing with soap and water Keeping your hands off of your face. These 3 practices will help to keep both you and your community healthy during this time. Please practice them faithfully!   Thanks for choosing [facility] we consider it a privelige to serve you.

## 2018-04-21 NOTE — Progress Notes (Signed)
Virtual Visit via Telephone Note  I connected with Megan Villa on 04/21/18 at  2:40 PM EDT by telephone and verified that I am speaking with the correct person using two identifiers.   I discussed the limitations, risks, security and privacy concerns of performing an evaluation and management service by telephone and the availability of in person appointments. I also discussed with the patient that there may be a patient responsible charge related to this service. The patient expressed understanding and agreed to proceed. Patient was in her car, leaving work, call was started at 3:15 , I am in my home, facial contact not possible   History of Present Illness:   f/u chronic problems and lab review Denies recent fever or chills. Denies sinus pressure, nasal congestion, ear pain or sore throat. Denies chest congestion, productive cough or wheezing. Denies chest pains, palpitations and leg swelling Denies abdominal pain, nausea, vomiting,diarrhea or constipation.   Denies dysuria, frequency, hesitancy or incontinence. Denies joint pain, swelling and limitation in mobility. Denies headaches, seizures, numbness, or tingling. Denies depression, anxiety or insomnia. Denies skin break down or rash.     Observations/Objective: BP 130/70 Comment: from record  Ht 5\' 7"  (1.702 m)   Wt 185 lb (83.9 kg)   BMI 28.98 kg/m    Assessment and Plan:  Essential hypertension Controlled, no change in medication DASH diet and commitment to daily physical activity for a minimum of 30 minutes discussed and encouraged, as a part of hypertension management. The importance of attaining a healthy weight is also discussed.  BP/Weight 04/21/2018 10/21/2017 04/21/2017 09/30/2016 04/01/2016 09/11/2015 04/08/2015  Systolic BP 130 128 122 110 114 120 120  Diastolic BP 70 70 80 70 64 80 74  Wt. (Lbs) 185 184.12 185 188.25 182 178 171  BMI 28.98 28.84 28.98 29.48 28.51 27.88 26.78       Hyperlipidemia LDL goal  <100 Hyperlipidemia:Low fat diet discussed and encouraged.   Lipid Panel  Lab Results  Component Value Date   CHOL 201 (H) 04/20/2018   HDL 71 04/20/2018   LDLCALC 115 (H) 04/20/2018   TRIG 60 04/20/2018   CHOLHDL 2.8 04/20/2018   Uncontrolled, no med change, needs to modify diet Updated lab needed at/ before next visit.     Overweight (BMI 25.0-29.9) Deteriorated.  Patient re-educated about  the importance of commitment to a  minimum of 150 minutes of exercise per week as able.  The importance of healthy food choices with portion control discussed, as well as eating regularly and within a 12 hour window most days. The need to choose "clean , green" food 50 to 75% of the time is discussed, as well as to make water the primary drink and set a goal of 64 ounces water daily.  Encouraged to start a food diary,  and to consider  joining a support group. Sample diet sheets offered. Goals set by the patient for the next several months.   Weight /BMI 04/21/2018 10/21/2017 04/21/2017  WEIGHT 185 lb 184 lb 1.9 oz 185 lb  HEIGHT 5\' 7"  5\' 7"  5\' 7"   BMI 28.98 kg/m2 28.84 kg/m2 28.98 kg/m2      Seasonal allergies Increased symptoms though not uncontrolled, has Flonase which she uses as needed  Metabolic syndrome X The increased risk of cardiovascular disease associated with this diagnosis, and the need to consistently work on lifestyle to change this is discussed. Following  a  heart healthy diet ,commitment to 30 minutes of exercise at least 5 days  per week, as well as control of blood sugar and cholesterol , and achieving a healthy weight are all the areas to be addressed .    Follow Up Instructions:    I discussed the assessment and treatment plan with the patient. The patient was provided an opportunity to ask questions and all were answered. The patient agreed with the plan and demonstrated an understanding of the instructions.   The patient was advised to call back or seek an  in-person evaluation if the symptoms worsen or if the condition fails to improve as anticipated.  I provided 22 minutes of non-face-to-face time during this encounter.   Syliva Overman, MD

## 2018-04-21 NOTE — Assessment & Plan Note (Signed)
Hyperlipidemia:Low fat diet discussed and encouraged.   Lipid Panel  Lab Results  Component Value Date   CHOL 201 (H) 04/20/2018   HDL 71 04/20/2018   LDLCALC 115 (H) 04/20/2018   TRIG 60 04/20/2018   CHOLHDL 2.8 04/20/2018   Uncontrolled, no med change, needs to modify diet Updated lab needed at/ before next visit.

## 2018-04-21 NOTE — Assessment & Plan Note (Signed)
Deteriorated.  Patient re-educated about  the importance of commitment to a  minimum of 150 minutes of exercise per week as able.  The importance of healthy food choices with portion control discussed, as well as eating regularly and within a 12 hour window most days. The need to choose "clean , green" food 50 to 75% of the time is discussed, as well as to make water the primary drink and set a goal of 64 ounces water daily.  Encouraged to start a food diary,  and to consider  joining a support group. Sample diet sheets offered. Goals set by the patient for the next several months.   Weight /BMI 04/21/2018 10/21/2017 04/21/2017  WEIGHT 185 lb 184 lb 1.9 oz 185 lb  HEIGHT 5\' 7"  5\' 7"  5\' 7"   BMI 28.98 kg/m2 28.84 kg/m2 28.98 kg/m2

## 2018-04-21 NOTE — Assessment & Plan Note (Signed)
The increased risk of cardiovascular disease associated with this diagnosis, and the need to consistently work on lifestyle to change this is discussed. Following  a  heart healthy diet ,commitment to 30 minutes of exercise at least 5 days per week, as well as control of blood sugar and cholesterol , and achieving a healthy weight are all the areas to be addressed .  

## 2018-04-22 NOTE — Addendum Note (Signed)
Addended by: Abner Greenspan on: 04/22/2018 08:38 AM   Modules accepted: Orders

## 2018-04-25 ENCOUNTER — Other Ambulatory Visit: Payer: Self-pay | Admitting: Family Medicine

## 2018-04-25 DIAGNOSIS — Z1231 Encounter for screening mammogram for malignant neoplasm of breast: Secondary | ICD-10-CM

## 2018-10-03 ENCOUNTER — Ambulatory Visit (HOSPITAL_COMMUNITY): Payer: BLUE CROSS/BLUE SHIELD

## 2018-10-10 ENCOUNTER — Ambulatory Visit: Payer: BLUE CROSS/BLUE SHIELD | Admitting: Family Medicine

## 2018-10-13 ENCOUNTER — Ambulatory Visit (HOSPITAL_COMMUNITY): Payer: BC Managed Care – PPO

## 2018-10-17 ENCOUNTER — Other Ambulatory Visit: Payer: Self-pay

## 2018-10-17 ENCOUNTER — Ambulatory Visit (HOSPITAL_COMMUNITY)
Admission: RE | Admit: 2018-10-17 | Discharge: 2018-10-17 | Disposition: A | Discharge status: Home / Self Care | Payer: BC Managed Care – PPO | Source: Ambulatory Visit | Attending: Family Medicine | Admitting: Family Medicine

## 2018-10-17 DIAGNOSIS — Z1231 Encounter for screening mammogram for malignant neoplasm of breast: Secondary | ICD-10-CM | POA: Insufficient documentation

## 2018-10-24 ENCOUNTER — Telehealth: Payer: Self-pay

## 2018-10-24 DIAGNOSIS — E785 Hyperlipidemia, unspecified: Secondary | ICD-10-CM

## 2018-10-24 DIAGNOSIS — E739 Lactose intolerance, unspecified: Secondary | ICD-10-CM

## 2018-10-24 DIAGNOSIS — I10 Essential (primary) hypertension: Secondary | ICD-10-CM

## 2018-10-24 NOTE — Telephone Encounter (Signed)
Expired labs reordered  

## 2018-10-25 DIAGNOSIS — I10 Essential (primary) hypertension: Secondary | ICD-10-CM | POA: Diagnosis not present

## 2018-10-25 DIAGNOSIS — E739 Lactose intolerance, unspecified: Secondary | ICD-10-CM | POA: Diagnosis not present

## 2018-10-25 DIAGNOSIS — E785 Hyperlipidemia, unspecified: Secondary | ICD-10-CM | POA: Diagnosis not present

## 2018-10-26 LAB — COMPLETE METABOLIC PANEL WITH GFR
AG Ratio: 1.6 (calc) (ref 1.0–2.5)
ALT: 14 U/L (ref 6–29)
AST: 19 U/L (ref 10–35)
Albumin: 4.2 g/dL (ref 3.6–5.1)
Alkaline phosphatase (APISO): 80 U/L (ref 37–153)
BUN: 11 mg/dL (ref 7–25)
CO2: 30 mmol/L (ref 20–32)
Calcium: 9.2 mg/dL (ref 8.6–10.4)
Chloride: 106 mmol/L (ref 98–110)
Creat: 0.74 mg/dL (ref 0.50–0.99)
GFR, Est African American: 101 mL/min/{1.73_m2} (ref >=60)
GFR, Est Non African American: 87 mL/min/{1.73_m2} (ref >=60)
Globulin: 2.6 g/dL (calc) (ref 1.9–3.7)
Glucose, Bld: 110 mg/dL — ABNORMAL HIGH (ref 65–99)
Potassium: 3.8 mmol/L (ref 3.5–5.3)
Sodium: 141 mmol/L (ref 135–146)
Total Bilirubin: 0.4 mg/dL (ref 0.2–1.2)
Total Protein: 6.8 g/dL (ref 6.1–8.1)

## 2018-10-26 LAB — LIPID PANEL
Cholesterol: 198 mg/dL (ref <200)
HDL: 67 mg/dL (ref >=50)
LDL Cholesterol (Calc): 116 mg/dL (calc) — ABNORMAL HIGH
Non-HDL Cholesterol (Calc): 131 mg/dL (calc) — ABNORMAL HIGH (ref <130)
Total CHOL/HDL Ratio: 3 (calc) (ref <5.0)
Triglycerides: 61 mg/dL (ref <150)

## 2018-10-26 LAB — HEMOGLOBIN A1C
Hgb A1c MFr Bld: 6.2 % of total Hgb — ABNORMAL HIGH (ref <5.7)
Mean Plasma Glucose: 131 (calc)
eAG (mmol/L): 7.3 (calc)

## 2018-10-27 ENCOUNTER — Other Ambulatory Visit: Payer: Self-pay

## 2018-10-27 ENCOUNTER — Other Ambulatory Visit (HOSPITAL_COMMUNITY)
Admission: RE | Admit: 2018-10-27 | Discharge: 2018-10-27 | Disposition: A | Discharge status: Home / Self Care | Payer: BC Managed Care – PPO | Source: Ambulatory Visit | Attending: Family Medicine | Admitting: Family Medicine

## 2018-10-27 ENCOUNTER — Encounter: Payer: Self-pay | Admitting: Family Medicine

## 2018-10-27 ENCOUNTER — Ambulatory Visit (INDEPENDENT_AMBULATORY_CARE_PROVIDER_SITE_OTHER): Payer: BC Managed Care – PPO | Admitting: Family Medicine

## 2018-10-27 VITALS — BP 128/82 | HR 79 | Temp 98.3°F | Resp 15 | Ht 67.0 in | Wt 206.0 lb

## 2018-10-27 DIAGNOSIS — E785 Hyperlipidemia, unspecified: Secondary | ICD-10-CM | POA: Diagnosis not present

## 2018-10-27 DIAGNOSIS — Z1211 Encounter for screening for malignant neoplasm of colon: Secondary | ICD-10-CM | POA: Diagnosis not present

## 2018-10-27 DIAGNOSIS — E739 Lactose intolerance, unspecified: Secondary | ICD-10-CM

## 2018-10-27 DIAGNOSIS — I10 Essential (primary) hypertension: Secondary | ICD-10-CM | POA: Diagnosis not present

## 2018-10-27 DIAGNOSIS — Z0000 Encounter for general adult medical examination without abnormal findings: Secondary | ICD-10-CM | POA: Diagnosis not present

## 2018-10-27 DIAGNOSIS — E8881 Metabolic syndrome: Secondary | ICD-10-CM

## 2018-10-27 DIAGNOSIS — E559 Vitamin D deficiency, unspecified: Secondary | ICD-10-CM

## 2018-10-27 DIAGNOSIS — Z124 Encounter for screening for malignant neoplasm of cervix: Secondary | ICD-10-CM | POA: Diagnosis not present

## 2018-10-27 NOTE — Patient Instructions (Addendum)
F/U in 6 months with MD. shingrix # 1 at visit, call if you need me before  Pap is sent  Samples of Replens provided.  1500 cal diet sheet provided  Please get fasting CBC, lipid, cmp and eGFR, HBA1C , TSH and vit D 1 to 2 weeks before follow up  You are referred for colonoscopy which is due, please call back in the next 1 week to let us know who we need to refer you to  Please work on food choice and environment , eat mainly fruit and vegetables, less processed foods , sugary foods and fatty foods 20 pound weight loss goal!  Think about what you will eat, plan ahead. Choose " clean, green, fresh or frozen" over canned, processed or packaged foods which are more sugary, salty and fatty. 70 to 75% of food eaten should be vegetables and fruit. Three meals at set times with snacks allowed between meals, but they must be fruit or vegetables. Aim to eat over a 12 hour period , example 7 am to 7 pm, and STOP after  your last meal of the day. Drink water,generally about 64 ounces per day, no other drink is as healthy. Fruit juice is best enjoyed in a healthy way, by EATING the fruit. It is important that you exercise regularly at least 30 minutes 5 times a week. If you develop chest pain, have severe difficulty breathing, or feel very tired, stop exercising immediately and seek medical attention

## 2018-10-28 ENCOUNTER — Encounter: Payer: Self-pay | Admitting: Family Medicine

## 2018-10-28 NOTE — Progress Notes (Signed)
    Megan Villa     MRN: [id number]      DOB: 06-06-1955  HPI: Patient is in for annual physical exam. No other health concerns are expressed or addressed at the visit. Recent labs, if available are reviewed. Immunization is reviewed , and  updated if needed.   PE: BP 128/82   Pulse 79   Temp 98.3 F (36.8 C) (Temporal)   Resp 15   Ht 5\' 7"  (1.702 m)   Wt 206 lb (93.4 kg)   SpO2 97%   BMI 32.26 kg/m   Pleasant  female, alert and oriented x 3, in no cardio-pulmonary distress. Afebrile. HEENT No facial trauma or asymetry. Sinuses non tender.  Extra occullar muscles intact.. External ears normal, . Neck: supple, no adenopathy,JVD or thyromegaly.No bruits.  Chest: Clear to ascultation bilaterally.No crackles or wheezes. Non tender to palpation  Breast: No asymetry,no masses or lumps. No tenderness. No nipple discharge or inversion. No axillary or supraclavicular adenopathy  Cardiovascular system; Heart sounds normal,  S1 and  S2 ,no S3.  No murmur, or thrill. Apical beat not displaced Peripheral pulses normal.  Abdomen: Soft, non tender, no organomegaly or masses. No bruits. Bowel sounds normal. No guarding, tenderness or rebound.  Rectal:  Normal sphincter tone. No rectal mass. Guaiac negative stool.  GU: External genitalia normal female genitalia , normal female distribution of hair. No lesions. Urethral meatus normal in size, no  Prolapse, no lesions visibly  Present. Bladder non tender. Vagina pink and moist , with no visible lesions , discharge present . Adequate pelvic support no  cystocele or rectocele noted Cervix pink and appears healthy, no lesions or ulcerations noted, no discharge noted from os Uterus normal size, no adnexal masses, no cervical motion or adnexal tenderness.   Musculoskeletal exam: Full ROM of spine, hips , shoulders and knees. No deformity ,swelling or crepitus noted. No muscle wasting or atrophy.   Neurologic: Cranial  nerves 2 to 12 intact. Power, tone ,sensation and reflexes normal throughout. No disturbance in gait. No tremor.  Skin: Intact, no ulceration, erythema , scaling or rash noted. Pigmentation normal throughout  Psych; Normal mood and affect. Judgement and concentration normal   Assessment & Plan:  Annual physical exam Annual exam as documented. Counseling done  re healthy lifestyle involving commitment to 150 minutes exercise per week, heart healthy diet, and attaining healthy weight.The importance of adequate sleep also discussed. Regular seat belt use and home safety, is also discussed. Changes in health habits are decided on by the patient with goals and time frames  set for achieving them. Immunization and cancer screening needs are specifically addressed at this visit.

## 2018-10-28 NOTE — Assessment & Plan Note (Signed)

## 2018-11-08 ENCOUNTER — Encounter: Payer: Self-pay | Admitting: Family Medicine

## 2018-11-08 LAB — CYTOLOGY - PAP
Adequacy: ABSENT
Comment: NEGATIVE
Diagnosis: NEGATIVE
High risk HPV: NEGATIVE

## 2018-11-10 ENCOUNTER — Encounter (INDEPENDENT_AMBULATORY_CARE_PROVIDER_SITE_OTHER): Payer: Self-pay | Admitting: *Deleted

## 2018-11-15 DIAGNOSIS — Z23 Encounter for immunization: Secondary | ICD-10-CM | POA: Diagnosis not present

## 2019-04-01 ENCOUNTER — Ambulatory Visit: Payer: BC Managed Care – PPO | Attending: Internal Medicine

## 2019-04-01 ENCOUNTER — Other Ambulatory Visit: Payer: Self-pay

## 2019-04-01 DIAGNOSIS — Z23 Encounter for immunization: Secondary | ICD-10-CM

## 2019-04-01 NOTE — Progress Notes (Signed)
   Covid-19 Vaccination Clinic  Name:  Letta Cargile    MRN: [id number] DOB: 07/26/1955  04/01/2019  Ms. Woolverton was observed post Covid-19 immunization for 15 minutes without incident. She was provided with Vaccine Information Sheet and instruction to access the V-Safe system.   Ms. Deland was instructed to call 911 with any severe reactions post vaccine: Marland Kitchen Difficulty breathing  . Swelling of face and throat  . A fast heartbeat  . A bad rash all over body  . Dizziness and weakness   Immunizations Administered    Name Date Dose VIS Date Route   Moderna COVID-19 Vaccine 04/01/2019  1:48 PM 0.5 mL 12/20/2018 Intramuscular   Manufacturer: Moderna   Lot: [id number]   NDC: 09030-149-96

## 2019-04-14 ENCOUNTER — Telehealth: Payer: Self-pay

## 2019-04-14 ENCOUNTER — Other Ambulatory Visit: Payer: Self-pay | Admitting: *Deleted

## 2019-04-14 MED ORDER — POTASSIUM CHLORIDE CRYS ER 20 MEQ PO TBCR: 20.0000 meq | EXTENDED_RELEASE_TABLET | Freq: Every day | ORAL | 2 refills | Status: DC

## 2019-04-14 NOTE — Telephone Encounter (Signed)
Medication called in pt notified

## 2019-04-14 NOTE — Telephone Encounter (Signed)
Please call in to [facility] [place]  Potassium Chloride

## 2019-04-21 DIAGNOSIS — E739 Lactose intolerance, unspecified: Secondary | ICD-10-CM | POA: Diagnosis not present

## 2019-04-21 DIAGNOSIS — E559 Vitamin D deficiency, unspecified: Secondary | ICD-10-CM | POA: Diagnosis not present

## 2019-04-21 DIAGNOSIS — E785 Hyperlipidemia, unspecified: Secondary | ICD-10-CM | POA: Diagnosis not present

## 2019-04-21 DIAGNOSIS — I10 Essential (primary) hypertension: Secondary | ICD-10-CM | POA: Diagnosis not present

## 2019-04-22 LAB — CBC
HCT: 40.2 % (ref 35.0–45.0)
Hemoglobin: 12.8 g/dL (ref 11.7–15.5)
MCH: 27.7 pg (ref 27.0–33.0)
MCHC: 31.8 g/dL — ABNORMAL LOW (ref 32.0–36.0)
MCV: 87 fL (ref 80.0–100.0)
MPV: 12.3 fL (ref 7.5–12.5)
Platelets: 308 10*3/uL (ref 140–400)
RBC: 4.62 10*6/uL (ref 3.80–5.10)
RDW: 13.6 % (ref 11.0–15.0)
WBC: 4.8 10*3/uL (ref 3.8–10.8)

## 2019-04-22 LAB — HEMOGLOBIN A1C
Hgb A1c MFr Bld: 6.2 % of total Hgb — ABNORMAL HIGH (ref <5.7)
Mean Plasma Glucose: 131 (calc)
eAG (mmol/L): 7.3 (calc)

## 2019-04-22 LAB — VITAMIN D 25 HYDROXY (VIT D DEFICIENCY, FRACTURES): Vit D, 25-Hydroxy: 22 ng/mL — ABNORMAL LOW (ref 30–100)

## 2019-04-22 LAB — LIPID PANEL
Cholesterol: 186 mg/dL (ref <200)
HDL: 64 mg/dL (ref >=50)
LDL Cholesterol (Calc): 108 mg/dL (calc) — ABNORMAL HIGH
Non-HDL Cholesterol (Calc): 122 mg/dL (calc) (ref <130)
Total CHOL/HDL Ratio: 2.9 (calc) (ref <5.0)
Triglycerides: 53 mg/dL (ref <150)

## 2019-04-22 LAB — COMPLETE METABOLIC PANEL WITH GFR
AG Ratio: 1.6 (calc) (ref 1.0–2.5)
ALT: 10 U/L (ref 6–29)
AST: 16 U/L (ref 10–35)
Albumin: 4.1 g/dL (ref 3.6–5.1)
Alkaline phosphatase (APISO): 78 U/L (ref 37–153)
BUN: 13 mg/dL (ref 7–25)
CO2: 27 mmol/L (ref 20–32)
Calcium: 9.4 mg/dL (ref 8.6–10.4)
Chloride: 106 mmol/L (ref 98–110)
Creat: 0.78 mg/dL (ref 0.50–0.99)
GFR, Est African American: 94 mL/min/{1.73_m2} (ref >=60)
GFR, Est Non African American: 81 mL/min/{1.73_m2} (ref >=60)
Globulin: 2.6 g/dL (calc) (ref 1.9–3.7)
Glucose, Bld: 108 mg/dL — ABNORMAL HIGH (ref 65–99)
Potassium: 3.8 mmol/L (ref 3.5–5.3)
Sodium: 142 mmol/L (ref 135–146)
Total Bilirubin: 0.3 mg/dL (ref 0.2–1.2)
Total Protein: 6.7 g/dL (ref 6.1–8.1)

## 2019-04-22 LAB — TSH: TSH: 2.62 mIU/L (ref 0.40–4.50)

## 2019-05-01 ENCOUNTER — Ambulatory Visit: Payer: BC Managed Care – PPO | Admitting: Family Medicine

## 2019-05-02 ENCOUNTER — Other Ambulatory Visit: Payer: Self-pay

## 2019-05-02 ENCOUNTER — Encounter: Payer: Self-pay | Admitting: Family Medicine

## 2019-05-02 ENCOUNTER — Ambulatory Visit (INDEPENDENT_AMBULATORY_CARE_PROVIDER_SITE_OTHER): Payer: BC Managed Care – PPO | Admitting: Family Medicine

## 2019-05-02 VITALS — BP 128/82 | Ht 67.0 in | Wt 206.0 lb

## 2019-05-02 DIAGNOSIS — E785 Hyperlipidemia, unspecified: Secondary | ICD-10-CM

## 2019-05-02 DIAGNOSIS — I10 Essential (primary) hypertension: Secondary | ICD-10-CM

## 2019-05-02 DIAGNOSIS — E1165 Type 2 diabetes mellitus with hyperglycemia: Secondary | ICD-10-CM | POA: Insufficient documentation

## 2019-05-02 DIAGNOSIS — R7303 Prediabetes: Secondary | ICD-10-CM | POA: Diagnosis not present

## 2019-05-02 DIAGNOSIS — Z6830 Body mass index (BMI) 30.0-30.9, adult: Secondary | ICD-10-CM

## 2019-05-02 DIAGNOSIS — E669 Obesity, unspecified: Secondary | ICD-10-CM | POA: Diagnosis not present

## 2019-05-02 DIAGNOSIS — IMO0002 Reserved for concepts with insufficient information to code with codable children: Secondary | ICD-10-CM | POA: Insufficient documentation

## 2019-05-02 NOTE — Progress Notes (Signed)
Virtual Visit via Telephone Note   This visit type was conducted due to national recommendations for restrictions regarding the COVID-19 Pandemic (e.g. social distancing) in an effort to limit this patient's exposure and mitigate transmission in our community.  Due to her co-morbid illnesses, this patient is at least at moderate risk for complications without adequate follow up.  This format is felt to be most appropriate for this patient at this time.  The patient did not have access to video technology/had technical difficulties with video requiring transitioning to audio format only (telephone).  All issues noted in this document were discussed and addressed.  No physical exam could be performed with this format.   Evaluation Performed:  Follow-up visit  Date:  05/03/2019   ID:  Megan Villa, DOB 12-15-1955, MRN [id number]  Patient Location: Home Provider Location: Office  Location of Patient: Home Location of Provider: Telehealth Consent was obtain for visit to be over via telehealth. I verified that I am speaking with the correct person using two identifiers.  PCP:  Fayrene Helper, MD   Chief Complaint:    History of Present Illness:    Megan Villa is a 64 y.o. female with   The patient does not have symptoms concerning for COVID-19 infection (fever, chills, cough, or new shortness of breath).   Past Medical, Surgical, Social History, Allergies, and Medications have been Reviewed.  Past Medical History:  Diagnosis Date  . Annual physical exam 09/11/2015  . Constipation   . Hyperlipidemia   . Hypertension   . IGT (impaired glucose tolerance)   . Metabolic syndrome X 05/21/9765  . Obesity   . Seasonal allergies    Past Surgical History:  Procedure Laterality Date  . CYSTECTOMY     left thumb   . CYSTECTOMY     left elbow   . TUBAL LIGATION       Current Meds  Medication Sig  . acetaminophen (TYLENOL) 500 MG tablet Take 1,000 mg by mouth every 6 (six)  hours as needed for headache.  . calcium-vitamin D (OSCAL WITH D) 500-200 MG-UNIT per tablet Take 1 tablet by mouth daily with breakfast.  . clotrimazole-betamethasone (LOTRISONE) cream Apply 1 application topically 2 (two) times daily.  . fluticasone (FLONASE) 50 MCG/ACT nasal spray Place 1 spray into both nostrils daily.  . hydrochlorothiazide (HYDRODIURIL) 25 MG tablet Take 1 tablet (25 mg total) by mouth daily.  Marland Kitchen lovastatin (MEVACOR) 40 MG tablet Take 1 tablet (40 mg total) by mouth at bedtime.  . potassium chloride SA (KLOR-CON) 20 MEQ tablet Take 1 tablet (20 mEq total) by mouth daily.     Allergies:   Penicillins   ROS:   Please see the history of present illness.    All other systems reviewed and are negative.   Labs/Other Tests and Data Reviewed:    Recent Labs: 04/21/2019: ALT 10; BUN 13; Creat 0.78; Hemoglobin 12.8; Platelets 308; Potassium 3.8; Sodium 142; TSH 2.62   Recent Lipid Panel Lab Results  Component Value Date/Time   CHOL 186 04/21/2019 08:08 AM   TRIG 53 04/21/2019 08:08 AM   HDL 64 04/21/2019 08:08 AM   CHOLHDL 2.9 04/21/2019 08:08 AM   LDLCALC 108 (H) 04/21/2019 08:08 AM    Wt Readings from Last 3 Encounters:  05/02/19 206 lb (93.4 kg)  10/27/18 206 lb (93.4 kg)  04/21/18 185 lb (83.9 kg)     Objective:    Vital Signs:  BP 128/82   Ht  5\' 7"  (1.702 m)   Wt 206 lb (93.4 kg)   BMI 32.26 kg/m    VITAL SIGNS:  reviewed GEN:  alert and oriented  RESPIRATORY:  no shortness of breath noted in conversation  PSYCH:  normal affect and mood   ASSESSMENT & PLAN:    1. Essential hypertension  - CBC - COMPLETE METABOLIC PANEL WITH GFR  2. Hyperlipidemia LDL goal <100  - CBC - COMPLETE METABOLIC PANEL WITH GFR - Lipid panel  3. Prediabetes - CBC - COMPLETE METABOLIC PANEL WITH GFR - Hemoglobin A1c  4. Obesity (BMI 30.0-34.9)    Time:   Today, I have spent 20 minutes with the patient with telehealth technology discussing the above  problems.     Medication Adjustments/Labs and Tests Ordered: Current medicines are reviewed at length with the patient today.  Concerns regarding medicines are outlined above.   Tests Ordered: Orders Placed This Encounter  Procedures  . CBC  . COMPLETE METABOLIC PANEL WITH GFR  . Hemoglobin A1c  . Lipid panel    Medication Changes: No orders of the defined types were placed in this encounter.   Disposition:  Follow up 5 months Signed, 05-23-1992, NP  05/03/2019 1:07 PM     05/05/2019 [facility]

## 2019-05-02 NOTE — Patient Instructions (Addendum)
I appreciate the opportunity to provide you with care for your health and wellness. Today we discussed: labs and diet   Follow up: 5 months  No labs or referrals today  Vit D3 2,000 IU daily  Walk daily 30-60 mins   Work on diet  Please continue to practice social distancing to keep you, your family, and our community safe.  If you must go out, please wear a mask and practice good handwashing.  It was a pleasure to see you and I look forward to continuing to work together on your health and well-being. Please do not hesitate to call the office if you need care or have questions about your care.  Have a wonderful day and week. With Gratitude, Tereasa Coop, DNP, AGNP-BC  [facility]  Walking is a great form of exercise to increase your strength, endurance and overall fitness.  A walking program can help you start slowly and gradually build endurance as you go.  Everyone's ability is different, so each person's starting point will be different.  You do not have to follow them exactly.  The are just samples. You should simply find out what's right for you and stick to that program.   In the beginning, you'll start off walking 2-3 times a day for short distances.  As you get stronger, you'll be walking further at just 1-2 times per day.  A. You Can Walk For A Certain Length Of Time Each Day    Walk 5 minutes 3 times per day.  Increase 2 minutes every 2 days (3 times per day).  Work up to 25-30 minutes (1-2 times per day).   Example:   Day 1-2 5 minutes 3 times per day   Day 7-8 12 minutes 2-3 times per day   Day 13-14 25 minutes 1-2 times per day  B. You Can Walk For a Certain Distance Each Day     Distance can be substituted for time.    Example:   3 trips to mailbox (at road)   3 trips to corner of block   3 trips around the block  C. Go to local [employer] and use the track.   Please only do the exercises that your therapist has initialed and dated

## 2019-05-03 ENCOUNTER — Encounter: Payer: Self-pay | Admitting: Family Medicine

## 2019-05-03 ENCOUNTER — Ambulatory Visit: Payer: BC Managed Care – PPO | Attending: Internal Medicine

## 2019-05-03 DIAGNOSIS — Z23 Encounter for immunization: Secondary | ICD-10-CM

## 2019-05-03 NOTE — Progress Notes (Signed)
   Covid-19 Vaccination Clinic  Name:  Megan Villa    MRN: [id number] DOB: February 20, 1955  05/03/2019  Megan Villa was observed post Covid-19 immunization for 15 minutes without incident. She was provided with Vaccine Information Sheet and instruction to access the V-Safe system.   Megan Villa was instructed to call 911 with any severe reactions post vaccine: Marland Kitchen Difficulty breathing  . Swelling of face and throat  . A fast heartbeat  . A bad rash all over body  . Dizziness and weakness   Immunizations Administered    Name Date Dose VIS Date Route   Moderna COVID-19 Vaccine 05/03/2019  2:37 PM 0.5 mL 12/20/2018 Intramuscular   Manufacturer: Moderna   Lot: [id number]   NDC: 90122-241-14

## 2019-05-03 NOTE — Assessment & Plan Note (Signed)
Megan Villa is encouraged to maintain a well balanced diet that is low in salt. Controlled, continue current medication regimen.   Additionally, she is also reminded that exercise is beneficial for heart health and control of  Blood pressure. 30-60 minutes daily is recommended-walking was suggested.

## 2019-05-03 NOTE — Assessment & Plan Note (Signed)
Deteriorated Megan Villa is re-educated about the importance of exercise daily to help with weight management. A minumum of 30 minutes daily is recommended. Additionally, importance of healthy food choices  with portion control discussed.  Wt Readings from Last 3 Encounters:  05/02/19 206 lb (93.4 kg)  10/27/18 206 lb (93.4 kg)  04/21/18 185 lb (83.9 kg)

## 2019-05-03 NOTE — Assessment & Plan Note (Signed)
Labs ordered before next visit. Encouraged low fat diet, continue statin.

## 2019-05-04 ENCOUNTER — Telehealth: Payer: Self-pay | Admitting: *Deleted

## 2019-05-04 NOTE — Telephone Encounter (Signed)
Pt was calling [facility] Client told her to take vitamin D she wanted to know should she take the vitamin D and the calcium together or should she stop taking the calcium.

## 2019-05-05 NOTE — Telephone Encounter (Signed)
Calcium recommendations are 1200-1500 mg daily (1500 mg for postmenopausal women or women without ovaries), and vitamin D 1000 IU daily.  This should be obtained from diet and/or supplements (vitamins), and calcium should not be taken in divided doses. It is best to take separate due to the calcium needing to be in 500 mg doses for best absorption.

## 2019-05-05 NOTE — Telephone Encounter (Signed)
She needs at least 4-5,000 IU daily. So she will need a separate one if that Vitamin D is not that amount. She will do this for 12 weeks, then will back down to 2,000 IU daily.

## 2019-05-05 NOTE — Telephone Encounter (Signed)
She wanted to know should she do a separate vitamin outside of the oscal as it had vitamin D in it? She didn't want to take too much vitamin D

## 2019-05-08 NOTE — Telephone Encounter (Signed)
Advised pt of recommendation with verbal understanding

## 2019-06-11 ENCOUNTER — Other Ambulatory Visit: Payer: Self-pay | Admitting: Family Medicine

## 2019-06-12 ENCOUNTER — Telehealth (INDEPENDENT_AMBULATORY_CARE_PROVIDER_SITE_OTHER): Payer: Self-pay

## 2019-06-12 NOTE — Telephone Encounter (Signed)
Pt ask for bill to be reviewed and re-file  06/12/19 - to [name] - re MR [id number] - email 06/12/19 Megan Villa MRN [id number] was seen at [facility] 05/02/19 (phone visit).  She has a bill for [id number] and it looks like [employer] did not cover the visit.  Can you look at this and see if the modifier -95,-cr was attached and if not add it and refile.  Or if they have another reason for not paying the claim.  Thanks for your help.

## 2019-06-19 ENCOUNTER — Other Ambulatory Visit: Payer: Self-pay | Admitting: Family Medicine

## 2019-06-21 ENCOUNTER — Other Ambulatory Visit: Payer: Self-pay

## 2019-06-21 ENCOUNTER — Telehealth: Payer: Self-pay

## 2019-06-21 MED ORDER — LOVASTATIN 40 MG PO TABS: 40.0000 mg | ORAL_TABLET | Freq: Every day | ORAL | 0 refills | Status: DC

## 2019-06-21 MED ORDER — HYDROCHLOROTHIAZIDE 25 MG PO TABS: 25.0000 mg | ORAL_TABLET | Freq: Every day | ORAL | 0 refills | Status: DC

## 2019-06-21 NOTE — Telephone Encounter (Signed)
Lovastatin & Hydrochlorothiazide , please send to [facility] in [place]

## 2019-06-21 NOTE — Telephone Encounter (Signed)
Refills sent to pharmacy as requested by patient. 

## 2019-09-12 ENCOUNTER — Telehealth: Payer: Self-pay

## 2019-09-12 NOTE — Telephone Encounter (Signed)
Pt is calling she needs someone to call her regarding sores in her mouth, she is not sure if they are infected or not.--She is requesting Majic Mouth Wash.

## 2019-09-12 NOTE — Telephone Encounter (Signed)
Message inadvertently sent on wrong patient

## 2019-09-12 NOTE — Telephone Encounter (Signed)
Wrong Patient --corrected and resent under correct pt

## 2019-09-18 ENCOUNTER — Other Ambulatory Visit: Payer: Self-pay | Admitting: Family Medicine

## 2019-09-18 ENCOUNTER — Other Ambulatory Visit: Payer: Self-pay

## 2019-09-18 MED ORDER — HYDROCHLOROTHIAZIDE 25 MG PO TABS: 25.0000 mg | ORAL_TABLET | Freq: Every day | ORAL | 1 refills | Status: DC

## 2019-10-03 ENCOUNTER — Ambulatory Visit: Payer: BC Managed Care – PPO | Admitting: Family Medicine

## 2019-10-13 DIAGNOSIS — I10 Essential (primary) hypertension: Secondary | ICD-10-CM | POA: Diagnosis not present

## 2019-10-13 DIAGNOSIS — R7303 Prediabetes: Secondary | ICD-10-CM | POA: Diagnosis not present

## 2019-10-13 DIAGNOSIS — E785 Hyperlipidemia, unspecified: Secondary | ICD-10-CM | POA: Diagnosis not present

## 2019-10-14 LAB — CBC
HCT: 37.8 % (ref 35.0–45.0)
Hemoglobin: 11.9 g/dL (ref 11.7–15.5)
MCH: 27 pg (ref 27.0–33.0)
MCHC: 31.5 g/dL — ABNORMAL LOW (ref 32.0–36.0)
MCV: 85.7 fL (ref 80.0–100.0)
MPV: 11.5 fL (ref 7.5–12.5)
Platelets: 297 10*3/uL (ref 140–400)
RBC: 4.41 10*6/uL (ref 3.80–5.10)
RDW: 13.6 % (ref 11.0–15.0)
WBC: 5.3 10*3/uL (ref 3.8–10.8)

## 2019-10-14 LAB — COMPLETE METABOLIC PANEL WITH GFR
AG Ratio: 1.5 (calc) (ref 1.0–2.5)
ALT: 14 U/L (ref 6–29)
AST: 18 U/L (ref 10–35)
Albumin: 4 g/dL (ref 3.6–5.1)
Alkaline phosphatase (APISO): 80 U/L (ref 37–153)
BUN: 15 mg/dL (ref 7–25)
CO2: 29 mmol/L (ref 20–32)
Calcium: 9.5 mg/dL (ref 8.6–10.4)
Chloride: 107 mmol/L (ref 98–110)
Creat: 0.63 mg/dL (ref 0.50–0.99)
GFR, Est African American: 111 mL/min/{1.73_m2} (ref >=60)
GFR, Est Non African American: 96 mL/min/{1.73_m2} (ref >=60)
Globulin: 2.6 g/dL (calc) (ref 1.9–3.7)
Glucose, Bld: 117 mg/dL — ABNORMAL HIGH (ref 65–99)
Potassium: 4.5 mmol/L (ref 3.5–5.3)
Sodium: 143 mmol/L (ref 135–146)
Total Bilirubin: 0.3 mg/dL (ref 0.2–1.2)
Total Protein: 6.6 g/dL (ref 6.1–8.1)

## 2019-10-14 LAB — HEMOGLOBIN A1C
Hgb A1c MFr Bld: 6.2 %{Hb} — ABNORMAL HIGH
Mean Plasma Glucose: 131 (calc)
eAG (mmol/L): 7.3 (calc)

## 2019-10-14 LAB — LIPID PANEL
Cholesterol: 195 mg/dL (ref <200)
HDL: 70 mg/dL (ref >=50)
LDL Cholesterol (Calc): 111 mg/dL (calc) — ABNORMAL HIGH
Non-HDL Cholesterol (Calc): 125 mg/dL (calc) (ref <130)
Total CHOL/HDL Ratio: 2.8 (calc) (ref <5.0)
Triglycerides: 49 mg/dL (ref <150)

## 2019-10-16 ENCOUNTER — Encounter: Payer: Self-pay | Admitting: Family Medicine

## 2019-10-16 ENCOUNTER — Ambulatory Visit: Payer: BC Managed Care – PPO | Admitting: Family Medicine

## 2019-10-16 ENCOUNTER — Other Ambulatory Visit: Payer: Self-pay

## 2019-10-16 VITALS — BP 128/75 | HR 84 | Resp 16 | Ht 68.0 in | Wt 198.1 lb

## 2019-10-16 DIAGNOSIS — E739 Lactose intolerance, unspecified: Secondary | ICD-10-CM | POA: Diagnosis not present

## 2019-10-16 DIAGNOSIS — E785 Hyperlipidemia, unspecified: Secondary | ICD-10-CM | POA: Diagnosis not present

## 2019-10-16 DIAGNOSIS — I10 Essential (primary) hypertension: Secondary | ICD-10-CM

## 2019-10-16 DIAGNOSIS — Z23 Encounter for immunization: Secondary | ICD-10-CM | POA: Diagnosis not present

## 2019-10-16 DIAGNOSIS — E669 Obesity, unspecified: Secondary | ICD-10-CM | POA: Diagnosis not present

## 2019-10-16 MED ORDER — FLUTICASONE PROPIONATE 50 MCG/ACT NA SUSP: 2.0000 | Freq: Every day | NASAL | 6 refills | Status: DC

## 2019-10-16 MED ORDER — CALCIUM CARBONATE-VITAMIN D 500-200 MG-UNIT PO TABS: ORAL_TABLET | ORAL | 3 refills | Status: DC

## 2019-10-16 NOTE — Patient Instructions (Addendum)
Annual  physical exam in office with pap with  MD in March, call if you need me before, shingrix #1 at visit  Flu vaccine today  Please schedule mammogram at checkout wants appointment at 3:30 or later  Please reduce fried and fatty foods to improve bad cholesterol  Continue once daily vit D 3 , 2000 IU, and start oscal D one twice daily as listed   It is important that you exercise regularly at least 30 minutes 5 times a week. If you develop chest pain, have severe difficulty breathing, or feel very tired, stop exercising immediately and seek medical attention    Think about what you will eat, plan ahead. Choose " clean, green, fresh or frozen" over canned, processed or packaged foods which are more sugary, salty and fatty. 70 to 75% of food eaten should be vegetables and fruit. Three meals at set times with snacks allowed between meals, but they must be fruit or vegetables. Aim to eat over a 12 hour period , example 7 am to 7 pm, and STOP after  your last meal of the day. Drink water,generally about 64 ounces per day, no other drink is as healthy. Fruit juice is best enjoyed in a healthy way, by EATING the fruit.   Thanks for choosing [facility], we consider it a privelige to serve you.

## 2019-10-17 ENCOUNTER — Other Ambulatory Visit (HOSPITAL_COMMUNITY): Payer: Self-pay | Admitting: Family Medicine

## 2019-10-17 ENCOUNTER — Encounter: Payer: Self-pay | Admitting: Family Medicine

## 2019-10-17 DIAGNOSIS — Z1231 Encounter for screening mammogram for malignant neoplasm of breast: Secondary | ICD-10-CM

## 2019-10-17 NOTE — Assessment & Plan Note (Signed)
  Patient re-educated about  the importance of commitment to a  minimum of 150 minutes of exercise per week as able.  The importance of healthy food choices with portion control discussed, as well as eating regularly and within a 12 hour window most days. The need to choose "clean , green" food 50 to 75% of the time is discussed, as well as to make water the primary drink and set a goal of 64 ounces water daily.    Weight /BMI 10/16/2019 05/02/2019 10/27/2018  WEIGHT 198 lb 1.9 oz 206 lb 206 lb  HEIGHT 5\' 8"  5\' 7"  5\' 7"   BMI 30.12 kg/m2 32.26 kg/m2 32.26 kg/m2

## 2019-10-17 NOTE — Assessment & Plan Note (Signed)
Hyperlipidemia:Low fat diet discussed and encouraged.   Lipid Panel  Lab Results  Component Value Date   CHOL 195 10/13/2019   HDL 70 10/13/2019   LDLCALC 111 (H) 10/13/2019   TRIG 49 10/13/2019   CHOLHDL 2.8 10/13/2019     Needs to reduce fat intake, not at goal

## 2019-10-17 NOTE — Assessment & Plan Note (Signed)
Patient educated about the importance of limiting  Carbohydrate intake , the need to commit to daily physical activity for a minimum of 30 minutes , and to commit weight loss. The fact that changes in all these areas will reduce or eliminate all together the development of diabetes is stressed.   Diabetic Labs Latest Ref Rng & Units 10/13/2019 04/21/2019 10/25/2018 04/20/2018 10/19/2017  HbA1c <5.7 % of total Hgb 6.2(H) 6.2(H) 6.2(H) 6.3(H) 6.2(H)  Chol <200 mg/dL 233 007 622 633(H) 545  HDL > OR = 50 mg/dL 70 64 67 71 70  Calc LDL mg/dL (calc) 625(W) 389(H) 734(K) 115(H) 101(H)  Triglycerides <150 mg/dL 49 53 61 60 42  Creatinine 0.50 - 0.99 mg/dL 8.76 8.11 5.72 6.20 3.55   BP/Weight 10/16/2019 05/02/2019 10/27/2018 04/21/2018 10/21/2017 04/21/2017 09/30/2016  Systolic BP 128 128 128 130 128 122 110  Diastolic BP 75 82 82 70 70 80 70  Wt. (Lbs) 198.12 206 206 185 184.12 185 188.25  BMI 30.12 32.26 32.26 28.98 28.84 28.98 29.48   No flowsheet data found.  Unchanged

## 2019-10-17 NOTE — Progress Notes (Signed)
Megan Villa     MRN: [id number]      DOB: [birth year]   HPI Ms. Clewis is here for follow up and re-evaluation of chronic medical conditions, medication management and review of any available recent lab and radiology data.  Preventive health is updated, specifically  Cancer screening and Immunization.   Questions or concerns regarding consultations or procedures which the PT has had in the interim are  addressed. The PT denies any adverse reactions to current medications since the last visit.  There are no new concerns.  There are no specific complaints   ROS Denies recent fever or chills. Denies sinus pressure, nasal congestion, ear pain or sore throat. Denies chest congestion, productive cough or wheezing. Denies chest pains, palpitations and leg swelling Denies abdominal pain, nausea, vomiting,diarrhea or constipation.   Denies dysuria, frequency, hesitancy or incontinence. Denies joint pain, swelling and limitation in mobility. Denies headaches, seizures, numbness, or tingling. Denies depression, anxiety or insomnia. Denies skin break down or rash.   PE  BP 128/75   Pulse 84   Resp 16   Ht 5\' 8"  (1.727 m)   Wt 198 lb 1.9 oz (89.9 kg)   SpO2 97%   BMI 30.12 kg/m   Patient alert and oriented and in no cardiopulmonary distress.  HEENT: No facial asymmetry, EOMI,     Neck supple .  Chest: Clear to auscultation bilaterally.  CVS: S1, S2 no murmurs, no S3.Regular rate.  ABD: Soft non tender.   Ext: No edema  MS: Adequate ROM spine, shoulders, hips and knees.  Skin: Intact, no ulcerations or rash noted.  Psych: Good eye contact, normal affect. Memory intact not anxious or depressed appearing.  CNS: CN 2-12 intact, power,  normal throughout.no focal deficits noted.   Assessment & Plan  Essential hypertension Controlled, no change in medication DASH diet and commitment to daily physical activity for a minimum of 30 minutes discussed and encouraged, as a  part of hypertension management. The importance of attaining a healthy weight is also discussed.  BP/Weight 10/16/2019 05/02/2019 10/27/2018 04/21/2018 10/21/2017 04/21/2017 09/30/2016  Systolic BP 128 128 128 130 128 122 110  Diastolic BP 75 82 82 70 70 80 70  Wt. (Lbs) 198.12 206 206 185 184.12 185 188.25  BMI 30.12 32.26 32.26 28.98 28.84 28.98 29.48       IMPAIRED GLUCOSE TOLERANCE Patient educated about the importance of limiting  Carbohydrate intake , the need to commit to daily physical activity for a minimum of 30 minutes , and to commit weight loss. The fact that changes in all these areas will reduce or eliminate all together the development of diabetes is stressed.   Diabetic Labs Latest Ref Rng & Units 10/13/2019 04/21/2019 10/25/2018 04/20/2018 10/19/2017  HbA1c <5.7 % of total Hgb 6.2(H) 6.2(H) 6.2(H) 6.3(H) 6.2(H)  Chol <200 mg/dL 12/19/2017 185 631 497) 026(V  HDL > OR = 50 mg/dL 70 64 67 71 70  Calc LDL mg/dL (calc) 785) 885(O) 277(A) 115(H) 101(H)  Triglycerides <150 mg/dL 49 53 61 60 42  Creatinine 0.50 - 0.99 mg/dL 128(N 8.67 6.72 0.94 7.09   BP/Weight 10/16/2019 05/02/2019 10/27/2018 04/21/2018 10/21/2017 04/21/2017 09/30/2016  Systolic BP 128 128 128 130 128 122 110  Diastolic BP 75 82 82 70 70 80 70  Wt. (Lbs) 198.12 206 206 185 184.12 185 188.25  BMI 30.12 32.26 32.26 28.98 28.84 28.98 29.48   No flowsheet data found.  Unchanged    Hyperlipidemia LDL goal <100 Hyperlipidemia:Low  fat diet discussed and encouraged.   Lipid Panel  Lab Results  Component Value Date   CHOL 195 10/13/2019   HDL 70 10/13/2019   LDLCALC 111 (H) 10/13/2019   TRIG 49 10/13/2019   CHOLHDL 2.8 10/13/2019     Needs to reduce fat intake, not at goal  Obesity (BMI 30.0-34.9)  Patient re-educated about  the importance of commitment to a  minimum of 150 minutes of exercise per week as able.  The importance of healthy food choices with portion control discussed, as well as eating regularly and within  a 12 hour window most days. The need to choose "clean , green" food 50 to 75% of the time is discussed, as well as to make water the primary drink and set a goal of 64 ounces water daily.    Weight /BMI 10/16/2019 05/02/2019 10/27/2018  WEIGHT 198 lb 1.9 oz 206 lb 206 lb  HEIGHT 5\' 8"  5\' 7"  5\' 7"   BMI 30.12 kg/m2 32.26 kg/m2 32.26 kg/m2

## 2019-10-17 NOTE — Assessment & Plan Note (Signed)
Controlled, no change in medication DASH diet and commitment to daily physical activity for a minimum of 30 minutes discussed and encouraged, as a part of hypertension management. The importance of attaining a healthy weight is also discussed.  BP/Weight 10/16/2019 05/02/2019 10/27/2018 04/21/2018 10/21/2017 04/21/2017 09/30/2016  Systolic BP 128 128 128 130 128 122 110  Diastolic BP 75 82 82 70 70 80 70  Wt. (Lbs) 198.12 206 206 185 184.12 185 188.25  BMI 30.12 32.26 32.26 28.98 28.84 28.98 29.48

## 2019-10-19 ENCOUNTER — Other Ambulatory Visit: Payer: Self-pay | Admitting: *Deleted

## 2019-10-19 ENCOUNTER — Telehealth: Payer: Self-pay | Admitting: Family Medicine

## 2019-10-19 DIAGNOSIS — Z1211 Encounter for screening for malignant neoplasm of colon: Secondary | ICD-10-CM

## 2019-10-19 NOTE — Telephone Encounter (Signed)
Patient states a colon kit was supposed to be mailed to her and she hasn't received it yet calling to check the status phone is [phone]

## 2019-10-19 NOTE — Telephone Encounter (Signed)
Needs colon screening, so o to please go ahead and order cologuard test and let her know. I tried calling , no answer

## 2019-10-19 NOTE — Telephone Encounter (Signed)
Cologuard ordered for pt lvm for pt to call the office

## 2019-10-19 NOTE — Telephone Encounter (Signed)
Patient seen on 9/27. Cologuard was not mentioned on her AVS. Would you like the kit to be ordered?

## 2019-10-19 NOTE — Progress Notes (Signed)
Pt went over lab results with pt on Monday appt

## 2019-10-19 NOTE — Telephone Encounter (Signed)
Pt notified with verbal understanding  °

## 2019-10-23 ENCOUNTER — Ambulatory Visit (HOSPITAL_COMMUNITY)
Admission: RE | Admit: 2019-10-23 | Discharge: 2019-10-23 | Disposition: A | Discharge status: Home / Self Care | Payer: BC Managed Care – PPO | Source: Ambulatory Visit | Attending: Family Medicine | Admitting: Family Medicine

## 2019-10-23 ENCOUNTER — Other Ambulatory Visit: Payer: Self-pay

## 2019-10-23 DIAGNOSIS — Z1231 Encounter for screening mammogram for malignant neoplasm of breast: Secondary | ICD-10-CM | POA: Diagnosis not present

## 2019-11-15 LAB — COLOGUARD

## 2019-11-23 ENCOUNTER — Other Ambulatory Visit: Payer: Self-pay | Admitting: Family Medicine

## 2019-11-23 ENCOUNTER — Encounter: Payer: Self-pay | Admitting: Family Medicine

## 2019-11-23 ENCOUNTER — Telehealth: Payer: Self-pay | Admitting: Family Medicine

## 2019-11-23 DIAGNOSIS — Z1211 Encounter for screening for malignant neoplasm of colon: Secondary | ICD-10-CM | POA: Diagnosis not present

## 2019-11-23 LAB — COLOGUARD: Cologuard: NEGATIVE

## 2019-11-23 NOTE — Telephone Encounter (Signed)
Patient needing a refill on lovastatin sent to [facility] in [place] p# [phone]

## 2019-11-23 NOTE — Telephone Encounter (Signed)
Medication has already been refilled.

## 2019-12-03 LAB — COLOGUARD: Cologuard: NEGATIVE

## 2019-12-19 ENCOUNTER — Other Ambulatory Visit: Payer: Self-pay | Admitting: *Deleted

## 2019-12-19 ENCOUNTER — Telehealth: Payer: Self-pay | Admitting: Family Medicine

## 2019-12-19 MED ORDER — HYDROCHLOROTHIAZIDE 25 MG PO TABS
25.0000 mg | ORAL_TABLET | Freq: Every day | ORAL | 1 refills | Status: DC
Start: 2019-12-19 — End: 2020-01-01

## 2019-12-19 NOTE — Telephone Encounter (Signed)
Patient needing a refill on Hydrochlorothiazide sent to [facility] in [place]

## 2019-12-19 NOTE — Telephone Encounter (Signed)
Medication sent to pt pharmacy 

## 2019-12-28 ENCOUNTER — Ambulatory Visit: Payer: BC Managed Care – PPO

## 2019-12-30 ENCOUNTER — Other Ambulatory Visit: Payer: Self-pay | Admitting: Family Medicine

## 2020-01-04 ENCOUNTER — Ambulatory Visit: Payer: BC Managed Care – PPO | Attending: Internal Medicine

## 2020-01-04 DIAGNOSIS — Z23 Encounter for immunization: Secondary | ICD-10-CM

## 2020-01-04 NOTE — Progress Notes (Signed)
   Covid-19 Vaccination [facility]  Name:  Megan Villa    MRN: [id number] DOB: Jul 02, 1955  01/04/2020  [name] was observed post Covid-19 immunization for 15 minutes without incident. She was provided with Vaccine Information Sheet and instruction to access the V-Safe system.   [name] was instructed to call [phone] with any severe reactions post vaccine: Marland Kitchen Difficulty breathing  . Swelling of face and throat  . A fast heartbeat  . A bad rash all over body  . Dizziness and weakness   Immunizations Administered    Name Date Dose VIS Date Route   Moderna Covid-19 Booster Vaccine 01/04/2020  3:40 PM 0.25 mL 11/08/2019 Intramuscular   Manufacturer: Moderna   Lot: [id number]   NDC: 75170-017-49

## 2020-01-09 ENCOUNTER — Other Ambulatory Visit: Payer: Self-pay

## 2020-01-09 ENCOUNTER — Telehealth: Payer: Self-pay | Admitting: Family Medicine

## 2020-01-09 ENCOUNTER — Other Ambulatory Visit: Payer: Self-pay | Admitting: *Deleted

## 2020-01-09 MED ORDER — POTASSIUM CHLORIDE CRYS ER 20 MEQ PO TBCR: 20.0000 meq | EXTENDED_RELEASE_TABLET | Freq: Every day | ORAL | 1 refills | Status: DC

## 2020-01-09 NOTE — Telephone Encounter (Signed)
Pt medication sent to pharmacy  

## 2020-01-09 NOTE — Telephone Encounter (Signed)
Refill med K[facility]-Con [facility]: [facility] [place]

## 2020-01-18 ENCOUNTER — Ambulatory Visit: Payer: BC Managed Care – PPO | Admitting: Nurse Practitioner

## 2020-02-06 ENCOUNTER — Ambulatory Visit: Payer: BC Managed Care – PPO | Admitting: Family Medicine

## 2020-03-15 ENCOUNTER — Telehealth: Payer: Self-pay

## 2020-03-15 DIAGNOSIS — E559 Vitamin D deficiency, unspecified: Secondary | ICD-10-CM

## 2020-03-15 DIAGNOSIS — E739 Lactose intolerance, unspecified: Secondary | ICD-10-CM

## 2020-03-15 DIAGNOSIS — I10 Essential (primary) hypertension: Secondary | ICD-10-CM

## 2020-03-15 DIAGNOSIS — E785 Hyperlipidemia, unspecified: Secondary | ICD-10-CM

## 2020-03-15 NOTE — Telephone Encounter (Signed)
Needs fasting lipid, cmp and EGFr, HBA1C , TSH and vit D

## 2020-03-15 NOTE — Telephone Encounter (Signed)
Patient called would like labs order for her physical coming up 04/08/20. She said she would like to come in 3 days prior to her physical appt.

## 2020-03-18 NOTE — Addendum Note (Signed)
Addended by: Abner Greenspan on: 03/18/2020 08:57 AM   Modules accepted: Orders

## 2020-03-18 NOTE — Telephone Encounter (Signed)
Pt aware that labs have been ordered.

## 2020-03-28 ENCOUNTER — Encounter: Payer: BC Managed Care – PPO | Admitting: Family Medicine

## 2020-04-03 ENCOUNTER — Other Ambulatory Visit: Payer: Self-pay

## 2020-04-03 ENCOUNTER — Telehealth: Payer: Self-pay

## 2020-04-03 MED ORDER — LOVASTATIN 40 MG PO TABS: 40.0000 mg | ORAL_TABLET | Freq: Every day | ORAL | 1 refills | Status: DC

## 2020-04-03 MED ORDER — POTASSIUM CHLORIDE CRYS ER 20 MEQ PO TBCR: 20.0000 meq | EXTENDED_RELEASE_TABLET | Freq: Every day | ORAL | 1 refills | Status: DC

## 2020-04-03 NOTE — Telephone Encounter (Signed)
Refill sent.

## 2020-04-03 NOTE — Telephone Encounter (Signed)
Please send refills over for Klor Con & Lovastatin

## 2020-04-05 DIAGNOSIS — E739 Lactose intolerance, unspecified: Secondary | ICD-10-CM | POA: Diagnosis not present

## 2020-04-05 DIAGNOSIS — E785 Hyperlipidemia, unspecified: Secondary | ICD-10-CM | POA: Diagnosis not present

## 2020-04-05 DIAGNOSIS — E559 Vitamin D deficiency, unspecified: Secondary | ICD-10-CM | POA: Diagnosis not present

## 2020-04-05 DIAGNOSIS — I10 Essential (primary) hypertension: Secondary | ICD-10-CM | POA: Diagnosis not present

## 2020-04-06 LAB — CMP14+EGFR
ALT: 16 IU/L (ref 0–32)
AST: 23 IU/L (ref 0–40)
Albumin/Globulin Ratio: 1.7 (ref 1.2–2.2)
Albumin: 4.3 g/dL (ref 3.8–4.8)
Alkaline Phosphatase: 98 IU/L (ref 44–121)
BUN/Creatinine Ratio: 21 (ref 12–28)
BUN: 16 mg/dL (ref 8–27)
Bilirubin Total: 0.3 mg/dL (ref 0.0–1.2)
CO2: 23 mmol/L (ref 20–29)
Calcium: 9.6 mg/dL (ref 8.7–10.3)
Chloride: 103 mmol/L (ref 96–106)
Creatinine, Ser: 0.75 mg/dL (ref 0.57–1.00)
Globulin, Total: 2.5 g/dL (ref 1.5–4.5)
Glucose: 114 mg/dL — ABNORMAL HIGH (ref 65–99)
Potassium: 4.3 mmol/L (ref 3.5–5.2)
Sodium: 144 mmol/L (ref 134–144)
Total Protein: 6.8 g/dL (ref 6.0–8.5)
eGFR: 89 mL/min/{1.73_m2} (ref >59)

## 2020-04-06 LAB — LIPID PANEL
Chol/HDL Ratio: 2.7 ratio (ref 0.0–4.4)
Cholesterol, Total: 182 mg/dL (ref 100–199)
HDL: 68 mg/dL (ref >39)
LDL Chol Calc (NIH): 102 mg/dL — ABNORMAL HIGH (ref 0–99)
Triglycerides: 62 mg/dL (ref 0–149)
VLDL Cholesterol Cal: 12 mg/dL (ref 5–40)

## 2020-04-06 LAB — TSH: TSH: 3.51 u[IU]/mL (ref 0.450–4.500)

## 2020-04-06 LAB — VITAMIN D 25 HYDROXY (VIT D DEFICIENCY, FRACTURES): Vit D, 25-Hydroxy: 40.5 ng/mL (ref 30.0–100.0)

## 2020-04-06 LAB — HEMOGLOBIN A1C
Est. average glucose Bld gHb Est-mCnc: 151 mg/dL
Hgb A1c MFr Bld: 6.9 % — ABNORMAL HIGH (ref 4.8–5.6)

## 2020-04-08 ENCOUNTER — Encounter: Payer: BC Managed Care – PPO | Admitting: Family Medicine

## 2020-04-10 ENCOUNTER — Ambulatory Visit: Payer: BC Managed Care – PPO | Admitting: Nurse Practitioner

## 2020-04-10 ENCOUNTER — Other Ambulatory Visit: Payer: Self-pay

## 2020-04-10 ENCOUNTER — Encounter: Payer: Self-pay | Admitting: Nurse Practitioner

## 2020-04-10 DIAGNOSIS — H6123 Impacted cerumen, bilateral: Secondary | ICD-10-CM | POA: Diagnosis not present

## 2020-04-10 DIAGNOSIS — H9202 Otalgia, left ear: Secondary | ICD-10-CM | POA: Diagnosis not present

## 2020-04-10 MED ORDER — DEBROX 6.5 % OT SOLN: 10.0000 [drp] | Freq: Two times a day (BID) | OTIC | 0 refills | Status: DC

## 2020-04-10 MED ORDER — DOXYCYCLINE HYCLATE 100 MG PO TABS: 100.0000 mg | ORAL_TABLET | Freq: Two times a day (BID) | ORAL | 0 refills | Status: DC

## 2020-04-10 NOTE — Assessment & Plan Note (Addendum)
-  has some pain and localized swelling to upper portion of left ear -unsure of etiology, possibly inflamed lymph node -Rx. Doxycycline -if no improvement in 1 week will consider ENT referral

## 2020-04-10 NOTE — Progress Notes (Signed)
Acute Office Visit  Subjective:    Patient ID: Megan Villa, female    DOB: 1955/06/04, 65 y.o.   MRN: [id number]  Chief Complaint  Patient presents with  . Otalgia    Sore in the L ear causing pain x 1 week.     HPI Patient is in today for left otalgia. Has been ongoing for 1 week. She states that it hurts worse when she lays on it.  She states that she can hear noises when she opens and closes her mouth.  No changes to her hearing. She states that is feels like her left ear is stopped up or has fluid in it.  Past Medical History:  Diagnosis Date  . Annual physical exam 09/11/2015  . Constipation   . Hyperlipidemia   . Hypertension   . IGT (impaired glucose tolerance)   . Metabolic syndrome X 06/25/2013  . Obesity   . Seasonal allergies     Past Surgical History:  Procedure Laterality Date  . CYSTECTOMY     left thumb   . CYSTECTOMY     left elbow   . TUBAL LIGATION      Family History  Problem Relation Age of Onset  . Diabetes Mother   . Diabetes Father   . Hypertension Father   . Stroke Father   . Diabetes Sister   . Diabetes Brother   . Diabetes Brother   . Arthritis Unknown        family history     Social History   Socioeconomic History  . Marital status: Married    Spouse name: Not on file  . Number of children: Not on file  . Years of education: Not on file  . Highest education level: Not on file  Occupational History  . Not on file  Tobacco Use  . Smoking status: Never Smoker  . Smokeless tobacco: Never Used  Substance and Sexual Activity  . Alcohol use: No  . Drug use: No  . Sexual activity: Not Currently  Other Topics Concern  . Not on file  Social History Narrative  . Not on file   Social Determinants of Health   Financial Resource Strain: Not on file  Food Insecurity: Not on file  Transportation Needs: Not on file  Physical Activity: Not on file  Stress: Not on file  Social Connections: Not on file  Intimate Partner  Violence: Not on file    Outpatient Medications Prior to Visit  Medication Sig Dispense Refill  . acetaminophen (TYLENOL) 500 MG tablet Take 1,000 mg by mouth every 6 (six) hours as needed for headache.    . calcium-vitamin D (OSCAL WITH D) 500-200 MG-UNIT tablet Take one tablet by mouth two times  daily 180 tablet 3  . Cholecalciferol (VITAMIN D3 PO) Take 2,000 Units by mouth daily.    . clotrimazole-betamethasone (LOTRISONE) cream Apply 1 application topically 2 (two) times daily. 45 g 1  . fluticasone (FLONASE) 50 MCG/ACT nasal spray Place 2 sprays into both nostrils daily. 16 g 6  . hydrochlorothiazide (HYDRODIURIL) 25 MG tablet Take 1 tablet by mouth once daily 90 tablet 0  . lovastatin (MEVACOR) 40 MG tablet Take 1 tablet (40 mg total) by mouth at bedtime. 90 tablet 1  . potassium chloride SA (KLOR-CON) 20 MEQ tablet Take 1 tablet (20 mEq total) by mouth daily. 90 tablet 1   No facility-administered medications prior to visit.    Allergies  Allergen Reactions  . Penicillins  REACTION: In high dosage    Review of Systems  Constitutional: Negative.   HENT: Positive for ear pain. Negative for ear discharge, facial swelling and hearing loss.        Left ear feels full       Objective:    Physical Exam Constitutional:      Appearance: Normal appearance.  HENT:     Right Ear: There is impacted cerumen.     Left Ear: There is impacted cerumen.  Neurological:     Mental Status: She is alert.     BP (!) 147/82   Pulse 75   Temp 99.1 F (37.3 C)   Resp 18   Ht 5\' 7"  (1.702 m)   Wt 200 lb (90.7 kg)   SpO2 96%   BMI 31.32 kg/m  Wt Readings from Last 3 Encounters:  04/10/20 200 lb (90.7 kg)  10/16/19 198 lb 1.9 oz (89.9 kg)  05/02/19 206 lb (93.4 kg)    Health Maintenance Due  Topic Date Due  . COLONOSCOPY (Pts 45-48yrs Insurance coverage will need to be confirmed)  05/04/2018    There are no preventive care reminders to display for this patient.   Lab  Results  Component Value Date   TSH 3.510 04/05/2020   Lab Results  Component Value Date   WBC 5.3 10/13/2019   HGB 11.9 10/13/2019   HCT 37.8 10/13/2019   MCV 85.7 10/13/2019   PLT 297 10/13/2019   Lab Results  Component Value Date   NA 144 04/05/2020   K 4.3 04/05/2020   CO2 23 04/05/2020   GLUCOSE 114 (H) 04/05/2020   BUN 16 04/05/2020   CREATININE 0.75 04/05/2020   BILITOT 0.3 04/05/2020   ALKPHOS 98 04/05/2020   AST 23 04/05/2020   ALT 16 04/05/2020   PROT 6.8 04/05/2020   ALBUMIN 4.3 04/05/2020   CALCIUM 9.6 04/05/2020   Lab Results  Component Value Date   CHOL 182 04/05/2020   Lab Results  Component Value Date   HDL 68 04/05/2020   Lab Results  Component Value Date   LDLCALC 102 (H) 04/05/2020   Lab Results  Component Value Date   TRIG 62 04/05/2020   Lab Results  Component Value Date   CHOLHDL 2.7 04/05/2020   Lab Results  Component Value Date   HGBA1C 6.9 (H) 04/05/2020       Assessment & Plan:   Problem List Items Addressed This Visit      Nervous and Auditory   Cerumen impaction    -bilateral -irrigation performed today; unable to visualize TM after irrigation -Rx. Debrox -will attempt irrigation again in 1 week         Other   Otalgia, left    -has some pain and localized swelling to upper portion of left ear -unsure of etiology, possibly inflamed lymph node -Rx. Doxycycline -if no improvement in 1 week will consider ENT referral          Meds ordered this encounter  Medications  . doxycycline (VIBRA-TABS) 100 MG tablet    Sig: Take 1 tablet (100 mg total) by mouth 2 (two) times daily.    Dispense:  20 tablet    Refill:  0     04/07/2020, NP

## 2020-04-10 NOTE — Assessment & Plan Note (Signed)
-  bilateral -irrigation performed today; unable to visualize TM after irrigation -Rx. Debrox -will attempt irrigation again in 1 week

## 2020-04-11 ENCOUNTER — Encounter: Payer: BC Managed Care – PPO | Admitting: Family Medicine

## 2020-04-17 ENCOUNTER — Ambulatory Visit: Payer: BC Managed Care – PPO | Admitting: Nurse Practitioner

## 2020-04-17 ENCOUNTER — Encounter: Payer: Self-pay | Admitting: Nurse Practitioner

## 2020-04-17 ENCOUNTER — Other Ambulatory Visit: Payer: Self-pay

## 2020-04-17 DIAGNOSIS — H6123 Impacted cerumen, bilateral: Secondary | ICD-10-CM | POA: Diagnosis not present

## 2020-04-17 DIAGNOSIS — H9202 Otalgia, left ear: Secondary | ICD-10-CM

## 2020-04-17 DIAGNOSIS — I10 Essential (primary) hypertension: Secondary | ICD-10-CM | POA: Diagnosis not present

## 2020-04-17 DIAGNOSIS — E1165 Type 2 diabetes mellitus with hyperglycemia: Secondary | ICD-10-CM | POA: Diagnosis not present

## 2020-04-17 MED ORDER — LISINOPRIL 10 MG PO TABS: 10.0000 mg | ORAL_TABLET | Freq: Every day | ORAL | 3 refills | Status: DC

## 2020-04-17 MED ORDER — BLOOD GLUCOSE METER KIT: PACK | 0 refills | Status: DC

## 2020-04-17 NOTE — Assessment & Plan Note (Signed)
-  she states that her pain is better after taking the doxycycline -still a little tender, but much better overall

## 2020-04-17 NOTE — Patient Instructions (Addendum)
Please have fasting labs drawn 2-3 days prior to your appointment with Dr. Lodema Hong so we can discuss the potassium/electrolyte results during your office visit.   Earwax Buildup, Adult The ears produce a substance called earwax that helps keep bacteria out of the ear and protects the skin in the ear canal. Occasionally, earwax can build up in the ear and cause discomfort or hearing loss. What are the causes? This condition is caused by a buildup of earwax. Ear canals are self-cleaning. Ear wax is made in the outer part of the ear canal and generally falls out in small amounts over time. When the self-cleaning mechanism is not working, earwax builds up and can cause decreased hearing and discomfort. Attempting to clean ears with cotton swabs can push the earwax deep into the ear canal and cause decreased hearing and pain. What increases the risk? This condition is more likely to develop in people who:  Clean their ears often with cotton swabs.  Pick at their ears.  Use earplugs or in-ear headphones often, or wear hearing aids. The following factors may also make you more likely to develop this condition:  Being female.  Being of older age.  Naturally producing more earwax.  Having narrow ear canals.  Having earwax that is overly thick or sticky.  Having excess hair in the ear canal.  Having eczema.  Being dehydrated. What are the signs or symptoms? Symptoms of this condition include:  Reduced or muffled hearing.  A feeling of fullness in the ear or feeling that the ear is plugged.  Fluid coming from the ear.  Ear pain or an itchy ear.  Ringing in the ear.  Coughing.  Balance problems.  An obvious piece of earwax that can be seen inside the ear canal. How is this diagnosed? This condition may be diagnosed based on:  Your symptoms.  Your medical history.  An ear exam. During the exam, your health care provider will look into your ear with an instrument called an  otoscope. You may have tests, including a hearing test. How is this treated? This condition may be treated by:  Using ear drops to soften the earwax.  Having the earwax removed by a health care provider. The health care provider may: ? Flush the ear with water. ? Use an instrument that has a loop on the end (curette). ? Use a suction device.  Having surgery to remove the wax buildup. This may be done in severe cases. Follow these instructions at home:  Take over-the-counter and prescription medicines only as told by your health care provider.  Do not put any objects, including cotton swabs, into your ear. You can clean the opening of your ear canal with a washcloth or facial tissue.  Follow instructions from your health care provider about cleaning your ears. Do not overclean your ears.  Drink enough fluid to keep your urine pale yellow. This will help to thin the earwax.  Keep all follow-up visits as told. If earwax builds up in your ears often or if you use hearing aids, consider seeing your health care provider for routine, preventive ear cleanings. Ask your health care provider how often you should schedule your cleanings.  If you have hearing aids, clean them according to instructions from the manufacturer and your health care provider.   Contact a health care provider if:  You have ear pain.  You develop a fever.  You have pus or other fluid coming from your ear.  You have hearing  loss.  You have ringing in your ears that does not go away.  You feel like the room is spinning (vertigo).  Your symptoms do not improve with treatment. Get help right away if:  You have bleeding from the affected ear.  You have severe ear pain. Summary  Earwax can build up in the ear and cause discomfort or hearing loss.  The most common symptoms of this condition include reduced or muffled hearing, a feeling of fullness in the ear, or feeling that the ear is plugged.  This  condition may be diagnosed based on your symptoms, your medical history, and an ear exam.  This condition may be treated by using ear drops to soften the earwax or by having the earwax removed by a health care provider.  Do not put any objects, including cotton swabs, into your ear. You can clean the opening of your ear canal with a washcloth or facial tissue. This information is not intended to replace advice given to you by your health care provider. Make sure you discuss any questions you have with your health care provider. Document Revised: 04/25/2019 Document Reviewed: 04/25/2019 Elsevier Patient Education  2021 [employer].

## 2020-04-17 NOTE — Assessment & Plan Note (Addendum)
-  goal A1c < 7 -I wanted to rx. Metformin, but she is refusing and would like to change her diet and cut carbs to treat her DM -Rx. Lisinopril for renal protection -Rx. Meter, strips, and lancets -taking lovastatin

## 2020-04-17 NOTE — Addendum Note (Signed)
Addended by: Bjorn Pippin on: 04/17/2020 04:23 PM   Modules accepted: Level of Service

## 2020-04-17 NOTE — Progress Notes (Signed)
Acute Office Visit  Subjective:    Patient ID: Megan Villa, female    DOB: August 16, 1955, 65 y.o.   MRN: [id number]  Chief Complaint  Patient presents with  . Cerumen Impaction    Wax removal.   . Hypertension    HPI Patient is in today for bilateral cerumen impaction. She was seen in office about a week ago, and she was prescribed debrox drops after unsuccessful irrigation.  She returns today after using debrox.  Past Medical History:  Diagnosis Date  . Annual physical exam 09/11/2015  . Constipation   . Hyperlipidemia   . Hypertension   . IGT (impaired glucose tolerance)   . Metabolic syndrome X 04/27/163  . Obesity   . Seasonal allergies     Past Surgical History:  Procedure Laterality Date  . CYSTECTOMY     left thumb   . CYSTECTOMY     left elbow   . TUBAL LIGATION      Family History  Problem Relation Age of Onset  . Diabetes Mother   . Diabetes Father   . Hypertension Father   . Stroke Father   . Diabetes Sister   . Diabetes Brother   . Diabetes Brother   . Arthritis Unknown        family history     Social History   Socioeconomic History  . Marital status: Married    Spouse name: Not on file  . Number of children: Not on file  . Years of education: Not on file  . Highest education level: Not on file  Occupational History  . Not on file  Tobacco Use  . Smoking status: Never Smoker  . Smokeless tobacco: Never Used  Substance and Sexual Activity  . Alcohol use: No  . Drug use: No  . Sexual activity: Not Currently  Other Topics Concern  . Not on file  Social History Narrative  . Not on file   Social Determinants of Health   Financial Resource Strain: Not on file  Food Insecurity: Not on file  Transportation Needs: Not on file  Physical Activity: Not on file  Stress: Not on file  Social Connections: Not on file  Intimate Partner Violence: Not on file    Outpatient Medications Prior to Visit  Medication Sig Dispense Refill  .  acetaminophen (TYLENOL) 500 MG tablet Take 1,000 mg by mouth every 6 (six) hours as needed for headache.    . calcium-vitamin D (OSCAL WITH D) 500-200 MG-UNIT tablet Take one tablet by mouth two times  daily 180 tablet 3  . carbamide peroxide (DEBROX) 6.5 % OTIC solution Place 10 drops into both ears 2 (two) times daily. 15 mL 0  . Cholecalciferol (VITAMIN D3 PO) Take 2,000 Units by mouth daily.    . clotrimazole-betamethasone (LOTRISONE) cream Apply 1 application topically 2 (two) times daily. 45 g 1  . fluticasone (FLONASE) 50 MCG/ACT nasal spray Place 2 sprays into both nostrils daily. 16 g 6  . hydrochlorothiazide (HYDRODIURIL) 25 MG tablet Take 1 tablet by mouth once daily 90 tablet 0  . lovastatin (MEVACOR) 40 MG tablet Take 1 tablet (40 mg total) by mouth at bedtime. 90 tablet 1  . potassium chloride SA (KLOR-CON) 20 MEQ tablet Take 1 tablet (20 mEq total) by mouth daily. 90 tablet 1  . doxycycline (VIBRA-TABS) 100 MG tablet Take 1 tablet (100 mg total) by mouth 2 (two) times daily. 20 tablet 0   No facility-administered medications prior to visit.  Allergies  Allergen Reactions  . Penicillins     REACTION: In high dosage    Review of Systems  Constitutional: Negative.   HENT: Positive for ear pain.   Respiratory: Negative.   Cardiovascular: Negative.        Objective:    Physical Exam Constitutional:      Appearance: Normal appearance.  HENT:     Right Ear: There is impacted cerumen.     Left Ear: There is impacted cerumen.  Cardiovascular:     Rate and Rhythm: Normal rate and regular rhythm.     Pulses: Normal pulses.     Heart sounds: Normal heart sounds.  Pulmonary:     Effort: Pulmonary effort is normal.     Breath sounds: Normal breath sounds.  Neurological:     Mental Status: She is alert.     BP (!) 155/77   Pulse 80   Temp 97.9 F (36.6 C)   Resp 20   Ht 5' 7" (1.702 m)   Wt 198 lb (89.8 kg)   SpO2 97%   BMI 31.01 kg/m  Wt Readings from Last  3 Encounters:  04/17/20 198 lb (89.8 kg)  04/10/20 200 lb (90.7 kg)  10/16/19 198 lb 1.9 oz (89.9 kg)    Health Maintenance Due  Topic Date Due  . PNEUMOCOCCAL POLYSACCHARIDE VACCINE AGE 75-64 HIGH RISK  Never done  . FOOT EXAM  Never done  . OPHTHALMOLOGY EXAM  Never done  . COLONOSCOPY (Pts 45-100yr Insurance coverage will need to be confirmed)  05/04/2018    There are no preventive care reminders to display for this patient.   Lab Results  Component Value Date   TSH 3.510 04/05/2020   Lab Results  Component Value Date   WBC 5.3 10/13/2019   HGB 11.9 10/13/2019   HCT 37.8 10/13/2019   MCV 85.7 10/13/2019   PLT 297 10/13/2019   Lab Results  Component Value Date   NA 144 04/05/2020   K 4.3 04/05/2020   CO2 23 04/05/2020   GLUCOSE 114 (H) 04/05/2020   BUN 16 04/05/2020   CREATININE 0.75 04/05/2020   BILITOT 0.3 04/05/2020   ALKPHOS 98 04/05/2020   AST 23 04/05/2020   ALT 16 04/05/2020   PROT 6.8 04/05/2020   ALBUMIN 4.3 04/05/2020   CALCIUM 9.6 04/05/2020   Lab Results  Component Value Date   CHOL 182 04/05/2020   Lab Results  Component Value Date   HDL 68 04/05/2020   Lab Results  Component Value Date   LDLCALC 102 (H) 04/05/2020   Lab Results  Component Value Date   TRIG 62 04/05/2020   Lab Results  Component Value Date   CHOLHDL 2.7 04/05/2020   Lab Results  Component Value Date   HGBA1C 6.9 (H) 04/05/2020       Assessment & Plan:   Problem List Items Addressed This Visit      Cardiovascular and Mediastinum   Essential hypertension    -Rx. Lisinopril for BP and renal protection -will check BMP in 1 month since she in on HCTZ and K, if elevated I would cut out the K supplement      Relevant Medications   lisinopril (ZESTRIL) 10 MG tablet   Other Relevant Orders   Basic metabolic panel     Endocrine   [place]    -goal A1c < 7 -I wanted to rx. Metformin, but she is refusing and would like to  change her diet and cut carbs to treat her DM -Rx. Lisinopril for renal protection -Rx. Meter, strips, and lancets -taking lovastatin      Relevant Medications   lisinopril (ZESTRIL) 10 MG tablet   Other Relevant Orders   Basic metabolic panel     Nervous and Auditory   Cerumen impaction    -bilateral irrigation completed today -after irrigation, leftTM and bony landmarks visible without sign of infection -right cerumen impaction is still in place; continue debrox and we will irrigate again in 1 week        Other   Otalgia, left    -she states that her pain is better after taking the doxycycline -still a little tender, but much better overall          Meds ordered this encounter  Medications  . lisinopril (ZESTRIL) 10 MG tablet    Sig: Take 1 tablet (10 mg total) by mouth daily.    Dispense:  90 tablet    Refill:  3  . blood glucose meter kit and supplies    Sig: Dispense based on patient and insurance preference. Use to monitor blood sugar daily (FOR ICD-10 E11.65).    Dispense:  1 each    Refill:  0    Order Specific Question:   Number of strips    Answer:   100    Order Specific Question:   Number of lancets    Answer:   [place], NP

## 2020-04-17 NOTE — Assessment & Plan Note (Addendum)
-  bilateral irrigation completed today -after irrigation, leftTM and bony landmarks visible without sign of infection -right cerumen impaction is still in place; continue debrox and we will irrigate again in 1 week

## 2020-04-17 NOTE — Assessment & Plan Note (Addendum)
-  Rx. Lisinopril for BP and renal protection -will check BMP in 1 month since she in on HCTZ and K, if elevated I would cut out the K supplement

## 2020-04-25 ENCOUNTER — Encounter: Payer: Self-pay | Admitting: Nurse Practitioner

## 2020-04-25 ENCOUNTER — Ambulatory Visit: Payer: BC Managed Care – PPO | Admitting: Nurse Practitioner

## 2020-04-25 ENCOUNTER — Other Ambulatory Visit: Payer: Self-pay

## 2020-04-25 DIAGNOSIS — E1165 Type 2 diabetes mellitus with hyperglycemia: Secondary | ICD-10-CM | POA: Diagnosis not present

## 2020-04-25 NOTE — Progress Notes (Signed)
Acute Office Visit  Subjective:    Patient ID: Megan Villa, female    DOB: 28-Sep-1955, 65 y.o.   MRN: [id number]  Chief Complaint  Patient presents with  . Cerumen Impaction    R ear follow up for cerumen removal.     HPI Patient is in today for right cerumen impaction. She was prescribed debrox, and she states that a large clump of wax came out of her right ear since her last visit.  Past Medical History:  Diagnosis Date  . Annual physical exam 09/11/2015  . Constipation   . Hyperlipidemia   . Hypertension   . IGT (impaired glucose tolerance)   . Metabolic syndrome X 0/08/6576  . Obesity   . Seasonal allergies     Past Surgical History:  Procedure Laterality Date  . CYSTECTOMY     left thumb   . CYSTECTOMY     left elbow   . TUBAL LIGATION      Family History  Problem Relation Age of Onset  . Diabetes Mother   . Diabetes Father   . Hypertension Father   . Stroke Father   . Diabetes Sister   . Diabetes Brother   . Diabetes Brother   . Arthritis Unknown        family history     Social History   Socioeconomic History  . Marital status: Married    Spouse name: Not on file  . Number of children: Not on file  . Years of education: Not on file  . Highest education level: Not on file  Occupational History  . Not on file  Tobacco Use  . Smoking status: Never Smoker  . Smokeless tobacco: Never Used  Substance and Sexual Activity  . Alcohol use: No  . Drug use: No  . Sexual activity: Not Currently  Other Topics Concern  . Not on file  Social History Narrative  . Not on file   Social Determinants of Health   Financial Resource Strain: Not on file  Food Insecurity: Not on file  Transportation Needs: Not on file  Physical Activity: Not on file  Stress: Not on file  Social Connections: Not on file  Intimate Partner Violence: Not on file    Outpatient Medications Prior to Visit  Medication Sig Dispense Refill  . acetaminophen (TYLENOL) 500 MG  tablet Take 1,000 mg by mouth every 6 (six) hours as needed for headache.    . blood glucose meter kit and supplies Dispense based on patient and insurance preference. Use to monitor blood sugar daily (FOR ICD-10 E11.65). 1 each 0  . calcium-vitamin D (OSCAL WITH D) 500-200 MG-UNIT tablet Take one tablet by mouth two times  daily 180 tablet 3  . Cholecalciferol (VITAMIN D3 PO) Take 2,000 Units by mouth daily.    . clotrimazole-betamethasone (LOTRISONE) cream Apply 1 application topically 2 (two) times daily. 45 g 1  . fluticasone (FLONASE) 50 MCG/ACT nasal spray Place 2 sprays into both nostrils daily. 16 g 6  . hydrochlorothiazide (HYDRODIURIL) 25 MG tablet Take 1 tablet by mouth once daily 90 tablet 0  . lisinopril (ZESTRIL) 10 MG tablet Take 1 tablet (10 mg total) by mouth daily. 90 tablet 3  . lovastatin (MEVACOR) 40 MG tablet Take 1 tablet (40 mg total) by mouth at bedtime. 90 tablet 1  . potassium chloride SA (KLOR-CON) 20 MEQ tablet Take 1 tablet (20 mEq total) by mouth daily. 90 tablet 1  . carbamide peroxide (DEBROX) 6.5 %  OTIC solution Place 10 drops into both ears 2 (two) times daily. 15 mL 0   No facility-administered medications prior to visit.    Allergies  Allergen Reactions  . Penicillins     REACTION: In high dosage    Review of Systems  Constitutional: Negative.   HENT: Negative.   Respiratory: Negative.   Cardiovascular: Negative.        Objective:    Physical Exam Constitutional:      Appearance: Normal appearance.  HENT:     Right Ear: Tympanic membrane, ear canal and external ear normal.     Left Ear: Tympanic membrane, ear canal and external ear normal.  Cardiovascular:     Rate and Rhythm: Normal rate and regular rhythm.     Pulses: Normal pulses.     Heart sounds: Normal heart sounds.  Pulmonary:     Effort: Pulmonary effort is normal.     Breath sounds: Normal breath sounds.  Neurological:     Mental Status: She is alert.     BP 133/79    Pulse 90   Temp 98.3 F (36.8 C)   Resp 20   Ht _0  (1.702 m)   Wt 194 lb (88 kg)   SpO2 97%   BMI 30.38 kg/m  Wt Readings from Last 3 Encounters:  04/25/20 194 lb (88 kg)  04/17/20 198 lb (89.8 kg)  04/10/20 200 lb (90.7 kg)    Health Maintenance Due  Topic Date Due  . PNEUMOCOCCAL POLYSACCHARIDE VACCINE AGE 52-64 HIGH RISK  Never done  . FOOT EXAM  Never done  . OPHTHALMOLOGY EXAM  Never done  . COLONOSCOPY (Pts 45-74yr Insurance coverage will need to be confirmed)  05/04/2018    There are no preventive care reminders to display for this patient.   Lab Results  Component Value Date   TSH 3.510 04/05/2020   Lab Results  Component Value Date   WBC 5.3 10/13/2019   HGB 11.9 10/13/2019   HCT 37.8 10/13/2019   MCV 85.7 10/13/2019   PLT 297 10/13/2019   Lab Results  Component Value Date   NA 144 04/05/2020   K 4.3 04/05/2020   CO2 23 04/05/2020   GLUCOSE 114 (H) 04/05/2020   BUN 16 04/05/2020   CREATININE 0.75 04/05/2020   BILITOT 0.3 04/05/2020   ALKPHOS 98 04/05/2020   AST 23 04/05/2020   ALT 16 04/05/2020   PROT 6.8 04/05/2020   ALBUMIN 4.3 04/05/2020   CALCIUM 9.6 04/05/2020   Lab Results  Component Value Date   CHOL 182 04/05/2020   Lab Results  Component Value Date   HDL 68 04/05/2020   Lab Results  Component Value Date   LDLCALC 102 (H) 04/05/2020   Lab Results  Component Value Date   TRIG 62 04/05/2020   Lab Results  Component Value Date   CHOLHDL 2.7 04/05/2020   Lab Results  Component Value Date   HGBA1C 6.9 (H) 04/05/2020       Assessment & Plan:   Problem List Items Addressed This Visit      Endocrine   [place] diabetes mellitus, uncontrolled [place]    -goal A1c < 7 -I wanted to rx. Metformin, but she refused and would like to change her diet and cut carbs to treat her DM -taking Lisinopril for renal protection -has Meter, strips, and lancets -taking lovastatin -we discussed nutrition like cutting down on breads,  pastas, sodas, and potatoes  No orders of the defined types were placed in this encounter.    Noreene Larsson, NP

## 2020-04-25 NOTE — Assessment & Plan Note (Addendum)
-  goal A1c < 7 -I wanted to rx. Metformin, but she refused and would like to change her diet and cut carbs to treat her DM -taking Lisinopril for renal protection -has Meter, strips, and lancets -taking lovastatin -we discussed nutrition like cutting down on breads, pastas, sodas, and potatoes

## 2020-04-25 NOTE — Patient Instructions (Signed)
Preventing High Cholesterol Cholesterol is a white, waxy substance similar to fat that the human body needs to help build cells. The liver makes all the cholesterol that a person's body needs. Having high cholesterol (hypercholesterolemia) increases your risk for heart disease and stroke. Extra or excess cholesterol comes from the food that you eat. High cholesterol can often be prevented with diet and lifestyle changes. If you already have high cholesterol, you can control it with diet, lifestyle changes, and medicines. How can high cholesterol affect me? If you have high cholesterol, fatty deposits (plaques) may build up on the walls of your blood vessels. The blood vessels that carry blood away from your heart are called arteries. Plaques make the arteries narrower and stiffer. This in turn can:  Restrict or block blood flow and cause blood clots to form.  Increase your risk for heart attack and stroke. What can increase my risk for high cholesterol? This condition is more likely to develop in people who:  Eat foods that are high in saturated fat or cholesterol. Saturated fat is mostly found in foods that come from animal sources.  Are overweight.  Are not getting enough exercise.  Have a family history of high cholesterol (familial hypercholesterolemia). What actions can I take to prevent this? Nutrition  Eat less saturated fat.  Avoid trans fats (partially hydrogenated oils). These are often found in margarine and in some baked goods, fried foods, and snacks bought in packages.  Avoid precooked or cured meat, such as bacon, sausages, or meat loaves.  Avoid foods and drinks that have added sugars.  Eat more fruits, vegetables, and whole grains.  Choose healthy sources of protein, such as fish, poultry, lean cuts of red meat, beans, peas, lentils, and nuts.  Choose healthy sources of fat, such as: ? Nuts. ? Vegetable oils, especially olive oil. ? Fish that have healthy fats,  such as omega-3 fatty acids. These fish include mackerel or salmon.   Lifestyle  Lose weight if you are overweight. Maintaining a healthy body mass index (BMI) can help prevent or control high cholesterol. It can also lower your risk for diabetes and high blood pressure. Ask your health care provider to help you with a diet and exercise plan to lose weight safely.  Do not use any products that contain nicotine or tobacco, such as cigarettes, e-cigarettes, and chewing tobacco. If you need help quitting, ask your health care provider. Alcohol use  Do not drink alcohol if: ? Your health care provider tells you not to drink. ? You are pregnant, may be pregnant, or are planning to become pregnant.  If you drink alcohol: ? Limit how much you use to:  0-1 drink a day for women.  0-2 drinks a day for men. ? Be aware of how much alcohol is in your drink. In the [place] one drink equals one 12 oz bottle of beer (355 mL), one 5 oz glass of wine (148 mL), or one 1 oz glass of hard liquor (44 mL). Activity  Get enough exercise. Do exercises as told by your health care provider.  Each week, do at least 150 minutes of exercise that takes a medium level of effort (moderate-intensity exercise). This kind of exercise: ? Makes your heart beat faster while allowing you to still be able to talk. ? Can be done in short sessions several times a day or longer sessions a few times a week. For example, on 5 days each week, you could walk fast or ride   your bike 3 times a day for 10 minutes each time.   Medicines  Your health care provider may recommend medicines to help lower cholesterol. This may be a medicine to lower the amount of cholesterol that your liver makes. You may need medicine if: ? Diet and lifestyle changes have not lowered your cholesterol enough. ? You have high cholesterol and other risk factors for heart disease or stroke.  Take over-the-counter and prescription medicines only as told by your  health care provider. General information  Manage your risk factors for high cholesterol. Talk with your health care provider about all your risk factors and how to lower your risk.  Manage other conditions that you have, such as diabetes or high blood pressure (hypertension).  Have blood tests to check your cholesterol levels at regular points in time as told by your health care provider.  Keep all follow-up visits as told by your health care provider. This is important. Where to find more information  American Heart Association: www.heart.org  National Heart, Lung, and Blood Institute: Pop[employer].is Summary  High cholesterol increases your risk for heart disease and stroke. By keeping your cholesterol level low, you can reduce your risk for these conditions.  High cholesterol can often be prevented with diet and lifestyle changes.  Work with your health care provider to manage your risk factors, and have your blood tested regularly. This information is not intended to replace advice given to you by your health care provider. Make sure you discuss any questions you have with your health care provider. Document Revised: 10/18/2018 Document Reviewed: 10/18/2018 Elsevier Patient Education  2021 [employer] Inc.  Fat and Cholesterol Restricted Eating Plan Getting too much fat and cholesterol in your diet may cause health problems. Choosing the right foods helps keep your fat and cholesterol at normal levels. This can keep you from getting certain diseases. Your doctor may recommend an eating plan that includes:  Total fat: ______% or less of total calories a day.  Saturated fat: ______% or less of total calories a day.  Cholesterol: less than _________mg a day.  Fiber: ______g a day. What are tips for following this plan? Meal planning  At meals, divide your plate into four equal parts: ? Fill one-half of your plate with vegetables and green salads. ? Fill one-fourth of your  plate with whole grains. ? Fill one-fourth of your plate with low-fat (lean) protein foods.  Eat fish that is high in omega-3 fats at least two times a week. This includes mackerel, tuna, sardines, and salmon.  Eat foods that are high in fiber, such as whole grains, beans, apples, broccoli, carrots, peas, and barley. General tips  Work with your doctor to lose weight if you need to.  Avoid: ? Foods with added sugar. ? Fried foods. ? Foods with partially hydrogenated oils.  Limit alcohol intake to no more than 1 drink a day for nonpregnant women and 2 drinks a day for men. One drink equals 12 oz of beer, 5 oz of wine, or 1 oz of hard liquor.   Reading food labels  Check food labels for: ? Trans fats. ? Partially hydrogenated oils. ? Saturated fat (g) in each serving. ? Cholesterol (mg) in each serving. ? Fiber (g) in each serving.  Choose foods with healthy fats, such as: ? Monounsaturated fats. ? Polyunsaturated fats. ? Omega-3 fats.  Choose grain products that have whole grains. Look for the word "whole" as the first word in the ingredient list. Cooking  Cook foods using low-fat methods. These include baking, boiling, grilling, and broiling.  Eat more home-cooked foods. Eat at restaurants and buffets less often.  Avoid cooking using saturated fats, such as butter, cream, palm oil, palm kernel oil, and coconut oil. Recommended foods Fruits  All fresh, canned (in natural juice), or frozen fruits. Vegetables  Fresh or frozen vegetables (raw, steamed, roasted, or grilled). Green salads. Grains  Whole grains, such as whole wheat or whole grain breads, crackers, cereals, and pasta. Unsweetened oatmeal, bulgur, barley, quinoa, or brown rice. Corn or whole wheat flour tortillas. Meats and other protein foods  Ground beef (85% or leaner), grass-fed beef, or beef trimmed of fat. Skinless chicken or [place]. Ground chicken or [place]. Pork trimmed of fat. All fish and seafood.  Egg whites. Dried beans, peas, or lentils. Unsalted nuts or seeds. Unsalted canned beans. Nut butters without added sugar or oil. Dairy  Low-fat or nonfat dairy products, such as skim or 1% milk, 2% or reduced-fat cheeses, low-fat and fat-free ricotta or cottage cheese, or plain low-fat and nonfat yogurt. Fats and oils  Tub margarine without trans fats. Light or reduced-fat mayonnaise and salad dressings. Avocado. Olive, canola, sesame, or safflower oils. The items listed above may not be a complete list of foods and beverages you can eat. Contact a dietitian for more information.   Foods to avoid Fruits  Canned fruit in heavy syrup. Fruit in cream or butter sauce. Fried fruit. Vegetables  Vegetables cooked in cheese, cream, or butter sauce. Fried vegetables. Grains  White bread. White pasta. White rice. Cornbread. Bagels, pastries, and croissants. Crackers and snack foods that contain trans fat and hydrogenated oils. Meats and other protein foods  Fatty cuts of meat. Ribs, chicken wings, bacon, sausage, bologna, salami, chitterlings, fatback, hot dogs, bratwurst, and packaged lunch meats. Liver and organ meats. Whole eggs and egg yolks. Chicken and [place] with skin. Fried meat. Dairy  Whole or 2% milk, cream, half-and-half, and cream cheese. Whole milk cheeses. Whole-fat or sweetened yogurt. Full-fat cheeses. Nondairy creamers and whipped toppings. Processed cheese, cheese spreads, and cheese curds. Beverages  Alcohol. Sugar-sweetened drinks such as sodas, lemonade, and fruit drinks. Fats and oils  Butter, stick margarine, lard, shortening, ghee, or bacon fat. Coconut, palm kernel, and palm oils. Sweets and desserts  Corn syrup, sugars, honey, and molasses. Candy. Jam and jelly. Syrup. Sweetened cereals. Cookies, pies, cakes, donuts, muffins, and ice cream. The items listed above may not be a complete list of foods and beverages you should avoid. Contact a dietitian for more  information. Summary  Choosing the right foods helps keep your fat and cholesterol at normal levels. This can keep you from getting certain diseases.  At meals, fill one-half of your plate with vegetables and green salads.  Eat high-fiber foods, like whole grains, beans, apples, carrots, peas, and barley.  Limit added sugar, saturated fats, alcohol, and fried foods. This information is not intended to replace advice given to you by your health care provider. Make sure you discuss any questions you have with your health care provider. Document Revised: 05/10/2019 Document Reviewed: 05/10/2019 Elsevier Patient Education  2021 [employer].

## 2020-05-06 DIAGNOSIS — I10 Essential (primary) hypertension: Secondary | ICD-10-CM | POA: Diagnosis not present

## 2020-05-06 DIAGNOSIS — E1165 Type 2 diabetes mellitus with hyperglycemia: Secondary | ICD-10-CM | POA: Diagnosis not present

## 2020-05-07 LAB — BASIC METABOLIC PANEL
BUN/Creatinine Ratio: 16 (ref 12–28)
BUN: 13 mg/dL (ref 8–27)
CO2: 24 mmol/L (ref 20–29)
Calcium: 9.5 mg/dL (ref 8.7–10.3)
Chloride: 102 mmol/L (ref 96–106)
Creatinine, Ser: 0.79 mg/dL (ref 0.57–1.00)
Glucose: 121 mg/dL — ABNORMAL HIGH (ref 65–99)
Potassium: 4.5 mmol/L (ref 3.5–5.2)
Sodium: 140 mmol/L (ref 134–144)
eGFR: 84 mL/min/{1.73_m2} (ref >59)

## 2020-05-07 NOTE — Progress Notes (Signed)
Labs look great. Potassium was fine.

## 2020-05-09 ENCOUNTER — Telehealth: Payer: Self-pay

## 2020-05-09 ENCOUNTER — Other Ambulatory Visit: Payer: Self-pay

## 2020-05-09 ENCOUNTER — Encounter: Payer: Self-pay | Admitting: Family Medicine

## 2020-05-09 ENCOUNTER — Ambulatory Visit (INDEPENDENT_AMBULATORY_CARE_PROVIDER_SITE_OTHER): Payer: BC Managed Care – PPO | Admitting: Family Medicine

## 2020-05-09 VITALS — BP 123/69 | HR 80 | Temp 97.4°F | Ht 67.0 in | Wt 190.0 lb

## 2020-05-09 DIAGNOSIS — Z23 Encounter for immunization: Secondary | ICD-10-CM

## 2020-05-09 DIAGNOSIS — Z1231 Encounter for screening mammogram for malignant neoplasm of breast: Secondary | ICD-10-CM | POA: Diagnosis not present

## 2020-05-09 DIAGNOSIS — E785 Hyperlipidemia, unspecified: Secondary | ICD-10-CM

## 2020-05-09 DIAGNOSIS — E669 Obesity, unspecified: Secondary | ICD-10-CM

## 2020-05-09 DIAGNOSIS — E1159 Type 2 diabetes mellitus with other circulatory complications: Secondary | ICD-10-CM

## 2020-05-09 DIAGNOSIS — H547 Unspecified visual loss: Secondary | ICD-10-CM

## 2020-05-09 DIAGNOSIS — Z0000 Encounter for general adult medical examination without abnormal findings: Secondary | ICD-10-CM

## 2020-05-09 DIAGNOSIS — E66811 Obesity, class 1: Secondary | ICD-10-CM

## 2020-05-09 MED ORDER — ONETOUCH VERIO VI STRP: 1.0000 | ORAL_STRIP | Freq: Every day | 5 refills | Status: DC

## 2020-05-09 NOTE — Assessment & Plan Note (Signed)

## 2020-05-09 NOTE — Patient Instructions (Signed)
Follow-up end  July call if you need me sooner.TD at visit  Non fasting hBA1C, chem 7 and eGFR 3 days before follow up  Nurse visit for Shingrix # 2 ain 8 weeks  Shingrix No. 1 today.Tylenol 1 tablet given also  Mammogram to be scheduled at checkout.  Congrats on weight loss due to change in diet.  Continue this and continue testing blood sugar.  Test supplies needed will be prescribed.  Use over-the-counter hydrocortisone cream 1% sparingly to the itchy area around the keloid on your abdomen.  Use over-the-counter Replens for vaginal dryness as needed.  Coconut oil is also an alternative.  Increased sexual activity will also increase the moisture vaginally.  It is important that you exercise regularly at least 30 minutes 5 times a week. If you develop chest pain, have severe difficulty breathing, or feel very tired, stop exercising immediately and seek medical attention   Thanks for choosing [facility], we consider it a privelige to serve you.

## 2020-05-09 NOTE — Assessment & Plan Note (Signed)
Refer for eye exam

## 2020-05-09 NOTE — Assessment & Plan Note (Signed)
After obtaining informed consent, the vaccine is  administered , with no adverse effect noted at the time of administration. Tylenol given

## 2020-05-09 NOTE — Assessment & Plan Note (Signed)
Megan Villa is reminded of the importance of commitment to daily physical activity for 30 minutes or more, as able and the need to limit carbohydrate intake to 30 to 60 grams per meal to help with blood sugar control.   The need to take medication as prescribed, test blood sugar as directed, and to call between visits if there is a concern that blood sugar is uncontrolled is also discussed.   Megan Villa is reminded of the importance of daily foot exam, annual eye examination, and good blood sugar, blood pressure and cholesterol control.  Diabetic Labs Latest Ref Rng & Units 05/06/2020 04/05/2020 10/13/2019 04/21/2019 10/25/2018  HbA1c 4.8 - 5.6 % - 6.9(H) 6.2(H) 6.2(H) 6.2(H)  Chol 100 - 199 mg/dL - 643 329 518 841  HDL >39 mg/dL - 68 70 64 67  Calc LDL 0 - 99 mg/dL - 660(Y) 301(S) 010(X) 116(H)  Triglycerides 0 - 149 mg/dL - 62 49 53 61  Creatinine 0.57 - 1.00 mg/dL 3.23 5.57 3.22 0.25 4.27   BP/Weight 05/09/2020 04/25/2020 04/17/2020 04/10/2020 10/16/2019 05/02/2019 10/27/2018  Systolic BP 123 133 155 147 128 128 128  Diastolic BP 69 79 77 82 75 82 82  Wt. (Lbs) 190 194 198 200 198.12 206 206  BMI 29.76 30.38 31.01 31.32 30.12 32.26 32.26   Foot/eye exam completion dates 05/09/2020  Foot Form Completion Done

## 2020-05-09 NOTE — Assessment & Plan Note (Signed)
  Patient re-educated about  the importance of commitment to a  minimum of 150 minutes of exercise per week as able.  The importance of healthy food choices with portion control discussed, as well as eating regularly and within a 12 hour window most days. The need to choose "clean , green" food 50 to 75% of the time is discussed, as well as to make water the primary drink and set a goal of 64 ounces water daily.    Weight /BMI 05/09/2020 04/25/2020 04/17/2020  WEIGHT 190 lb 194 lb 198 lb  HEIGHT 5\' 7"  5\' 7"  5\' 7"   BMI 29.76 kg/m2 30.38 kg/m2 31.01 kg/m2

## 2020-05-09 NOTE — Telephone Encounter (Signed)
Pt needs strips called in

## 2020-05-09 NOTE — Progress Notes (Signed)
Megan Villa     MRN: [id number]      DOB: 1955/03/07  HPI: Patient is in for annual physical exam. No other health concerns are expressed or addressed at the visit. Recent labs,  are reviewed. Immunization is reviewed , and  updated  .   PE: BP 123/69 (BP Location: Left Arm, Patient Position: Sitting, Cuff Size: Normal)   Pulse 80   Temp (!) 97.4 F (36.3 C) (Temporal)   Ht 5\' 7"  (1.702 m)   Wt 190 lb (86.2 kg)   SpO2 98%   BMI 29.76 kg/m   Pleasant  female, alert and oriented x 3, in no cardio-pulmonary distress. Afebrile. HEENT No facial trauma or asymetry. Sinuses non tender.  Extra occullar muscles intact.. External ears normal, . Neck: supple, no adenopathy,JVD or thyromegaly.No bruits.  Chest: Clear to ascultation bilaterally.No crackles or wheezes. Non tender to palpation  Breast: Asymptomatic, not examined  Cardiovascular system; Heart sounds normal,  S1 and  S2 ,no S3.  No murmur, or thrill. Apical beat not displaced Peripheral pulses normal.  Abdomen: Soft, non tender, no organomegaly or masses. No bruits. Bowel sounds normal. No guarding, tenderness or rebound.   GU: Vaginal dryness , no other symptom, not exmined  Musculoskeletal exam: Full ROM of spine, hips , shoulders and slightly reduced in left  knee. Mild  deformity ,swelling and  crepitus noted.in left knee No muscle wasting or atrophy.   Neurologic: Cranial nerves 2 to 12 intact. Power, tone ,sensation and reflexes normal throughout. No disturbance in gait. No tremor.  Skin: Intact, no ulceration, mild periumbilical erythema , scaling or rash noted.Keloid on umbilicus, diameter approx 12 cm Pigmentation normal throughout  Psych; Normal mood and affect. Judgement and concentration normal   Assessment & Plan:  Annual physical exam Annual exam as documented. Counseling done  re healthy lifestyle involving commitment to 150 minutes exercise per week, heart healthy diet,  and attaining healthy weight.The importance of adequate sleep also discussed. Regular seat belt use and home safety, is also discussed. Changes in health habits are decided on by the patient with goals and time frames  set for achieving them. Immunization and cancer screening needs are specifically addressed at this visit.   Reduced vision Refer for eye exam  Need for shingles vaccine After obtaining informed consent, the vaccine is  administered , with no adverse effect noted at the time of administration. Tylenol given  Obesity (BMI 30.0-34.9)  Patient re-educated about  the importance of commitment to a  minimum of 150 minutes of exercise per week as able.  The importance of healthy food choices with portion control discussed, as well as eating regularly and within a 12 hour window most days. The need to choose "clean , green" food 50 to 75% of the time is discussed, as well as to make water the primary drink and set a goal of 64 ounces water daily.    Weight /BMI 05/09/2020 04/25/2020 04/17/2020  WEIGHT 190 lb 194 lb 198 lb  HEIGHT 5\' 7"  5\' 7"  5\' 7"   BMI 29.76 kg/m2 30.38 kg/m2 31.01 kg/m2      Type 2 diabetes mellitus with vascular disease (HCC) Megan Villa is reminded of the importance of commitment to daily physical activity for 30 minutes or more, as able and the need to limit carbohydrate intake to 30 to 60 grams per meal to help with blood sugar control.   The need to take medication as prescribed, test blood sugar as  directed, and to call between visits if there is a concern that blood sugar is uncontrolled is also discussed.   Megan Villa is reminded of the importance of daily foot exam, annual eye examination, and good blood sugar, blood pressure and cholesterol control.  Diabetic Labs Latest Ref Rng & Units 05/06/2020 04/05/2020 10/13/2019 04/21/2019 10/25/2018  HbA1c 4.8 - 5.6 % - 6.9(H) 6.2(H) 6.2(H) 6.2(H)  Chol 100 - 199 mg/dL - 263 785 885 027  HDL >39 mg/dL - 68 70  64 67  Calc LDL 0 - 99 mg/dL - 741(O) 878(M) 767(M) 116(H)  Triglycerides 0 - 149 mg/dL - 62 49 53 61  Creatinine 0.57 - 1.00 mg/dL 0.94 7.09 6.28 3.66 2.94   BP/Weight 05/09/2020 04/25/2020 04/17/2020 04/10/2020 10/16/2019 05/02/2019 10/27/2018  Systolic BP 123 133 155 147 128 128 128  Diastolic BP 69 79 77 82 75 82 82  Wt. (Lbs) 190 194 198 200 198.12 206 206  BMI 29.76 30.38 31.01 31.32 30.12 32.26 32.26   Foot/eye exam completion dates 05/09/2020  Foot Form Completion Done

## 2020-05-09 NOTE — Telephone Encounter (Signed)
Sent strips in  

## 2020-05-10 ENCOUNTER — Other Ambulatory Visit: Payer: Self-pay

## 2020-05-10 ENCOUNTER — Other Ambulatory Visit: Payer: Self-pay | Admitting: Family Medicine

## 2020-05-10 DIAGNOSIS — E1159 Type 2 diabetes mellitus with other circulatory complications: Secondary | ICD-10-CM

## 2020-05-10 MED ORDER — BLOOD GLUCOSE METER KIT: PACK | 0 refills | Status: DC

## 2020-06-14 ENCOUNTER — Telehealth: Payer: Self-pay

## 2020-06-14 ENCOUNTER — Other Ambulatory Visit: Payer: Self-pay | Admitting: Family Medicine

## 2020-06-14 NOTE — Telephone Encounter (Signed)
Patient needing refill on hydrochlorothiazide ph# [phone]

## 2020-06-14 NOTE — Telephone Encounter (Signed)
Rx has been refilled.  

## 2020-07-12 ENCOUNTER — Other Ambulatory Visit: Payer: Self-pay | Admitting: Family Medicine

## 2020-07-29 DIAGNOSIS — R7301 Impaired fasting glucose: Secondary | ICD-10-CM | POA: Diagnosis not present

## 2020-07-30 LAB — BMP8+EGFR
BUN/Creatinine Ratio: 20 (ref 12–28)
BUN: 15 mg/dL (ref 8–27)
CO2: 25 mmol/L (ref 20–29)
Calcium: 9.6 mg/dL (ref 8.7–10.3)
Chloride: 105 mmol/L (ref 96–106)
Creatinine, Ser: 0.74 mg/dL (ref 0.57–1.00)
Glucose: 105 mg/dL — ABNORMAL HIGH (ref 65–99)
Potassium: 4.5 mmol/L (ref 3.5–5.2)
Sodium: 143 mmol/L (ref 134–144)
eGFR: 91 mL/min/{1.73_m2} (ref >59)

## 2020-07-30 LAB — HEMOGLOBIN A1C
Est. average glucose Bld gHb Est-mCnc: 137 mg/dL
Hgb A1c MFr Bld: 6.4 % — ABNORMAL HIGH (ref 4.8–5.6)

## 2020-08-08 ENCOUNTER — Other Ambulatory Visit: Payer: Self-pay

## 2020-08-08 ENCOUNTER — Encounter: Payer: Self-pay | Admitting: Family Medicine

## 2020-08-08 ENCOUNTER — Ambulatory Visit: Payer: BC Managed Care – PPO | Admitting: Family Medicine

## 2020-08-08 VITALS — BP 139/77 | HR 82 | Temp 98.6°F | Resp 18 | Ht 67.0 in | Wt 189.0 lb

## 2020-08-08 DIAGNOSIS — Z23 Encounter for immunization: Secondary | ICD-10-CM

## 2020-08-08 DIAGNOSIS — E785 Hyperlipidemia, unspecified: Secondary | ICD-10-CM

## 2020-08-08 DIAGNOSIS — E669 Obesity, unspecified: Secondary | ICD-10-CM

## 2020-08-08 DIAGNOSIS — I10 Essential (primary) hypertension: Secondary | ICD-10-CM | POA: Diagnosis not present

## 2020-08-08 DIAGNOSIS — J302 Other seasonal allergic rhinitis: Secondary | ICD-10-CM

## 2020-08-08 DIAGNOSIS — E1159 Type 2 diabetes mellitus with other circulatory complications: Secondary | ICD-10-CM | POA: Diagnosis not present

## 2020-08-08 MED ORDER — CHLORPHENIRAMINE MALEATE 4 MG PO TABS: ORAL_TABLET | ORAL | 0 refills | Status: DC

## 2020-08-08 NOTE — Patient Instructions (Addendum)
Follow-up mid November call if you need me sooner.DT at that visit  Shingrix No. 2 today in office.  Please get your second COVID booster in the next 2 to 3 weeks at the pharmacy.  Please call and come for your flu vaccine in September or October.   New for sinus congestion and ear pressure is chlorpheniramine take 1 tablet once daily as needed.  After the first 3 days you likely will not need to use it continually.  Flush nostrils with normal saline once daily  every day for decongestion and continue Flonase  Fasting lipid, cmp and EGFr, hBA1C and cBC 5 days before  Nov appt  It is important that you exercise regularly at least 30 minutes 5 times a week. If you develop chest pain, have severe difficulty breathing, or feel very tired, stop exercising immediately and seek medical attention   Thanks for choosing [facility], we consider it a privelige to serve you.

## 2020-08-11 ENCOUNTER — Encounter: Payer: Self-pay | Admitting: Family Medicine

## 2020-08-11 NOTE — Assessment & Plan Note (Signed)
Mild symptoms, start as needed chlorpheniramine, limited supply, also saline nasal flushes

## 2020-08-11 NOTE — Assessment & Plan Note (Signed)
Hyperlipidemia:Low fat diet discussed and encouraged.   Lipid Panel  Lab Results  Component Value Date   CHOL 182 04/05/2020   HDL 68 04/05/2020   LDLCALC 102 (H) 04/05/2020   TRIG 62 04/05/2020   CHOLHDL 2.7 04/05/2020     Needs to reduce fat Updated lab needed at/ before next visit.

## 2020-08-11 NOTE — Assessment & Plan Note (Addendum)
Megan Villa is reminded of the importance of commitment to daily physical activity for 30 minutes or more, as able and the need to limit carbohydrate intake to 30 to 60 grams per meal to help with blood sugar control.     Megan Villa is reminded of the importance of daily foot exam, annual eye examination, and good blood sugar, blood pressure and cholesterol control.  Diabetic Labs Latest Ref Rng & Units 07/29/2020 05/06/2020 04/05/2020 10/13/2019 04/21/2019  HbA1c 4.8 - 5.6 % 6.4(H) - 6.9(H) 6.2(H) 6.2(H)  Chol 100 - 199 mg/dL - - 748 270 786  HDL >75 mg/dL - - 68 70 64  Calc LDL 0 - 99 mg/dL - - 449(E) 010(O) 712(R)  Triglycerides 0 - 149 mg/dL - - 62 49 53  Creatinine 0.57 - 1.00 mg/dL 9.75 8.83 2.54 9.82 6.41   BP/Weight 08/08/2020 05/09/2020 04/25/2020 04/17/2020 04/10/2020 10/16/2019 05/02/2019  Systolic BP 139 123 133 155 147 128 128  Diastolic BP 77 69 79 77 82 75 82  Wt. (Lbs) 189 190 194 198 200 198.12 206  BMI 29.6 29.76 30.38 31.01 31.32 30.12 32.26   Foot/eye exam completion dates 05/09/2020  Foot Form Completion Done  improved, which is great!

## 2020-08-11 NOTE — Progress Notes (Signed)
Megan Villa     MRN: [id number]      DOB: [birth year]   HPI Megan Villa is here for follow up and re-evaluation of chronic medical conditions, medication management and review of any available recent lab and radiology data.  Preventive health is updated, specifically  Cancer screening and Immunization.   Questions or concerns regarding consultations or procedures which the PT has had in the interim are  addressed. The PT denies any adverse reactions to current medications since the last visit.  5 day h/o increased nasal and sinus congestion with PND, no fever or chills, occasional cough, scant clear sputum, nasal drainage is clear   ROS Denies recent fever or chills. . Denies chest pains, palpitations and leg swelling Denies abdominal pain, nausea, vomiting,diarrhea or constipation.   Denies dysuria, frequency, hesitancy or incontinence. Denies joint pain, swelling and limitation in mobility. Denies headaches, seizures, numbness, or tingling. Denies depression, anxiety or insomnia. Denies skin break down or rash.   PE  BP 139/77 (BP Location: Right Arm, Patient Position: Sitting, Cuff Size: Large)   Pulse 82   Temp 98.6 F (37 C)   Resp 18   Ht 5\' 7"  (1.702 m)   Wt 189 lb (85.7 kg)   SpO2 95%   BMI 29.60 kg/m   Patient alert and oriented and in no cardiopulmonary distress.  HEENT: No facial asymmetry, EOMI,     Neck supple .  Chest: Clear to auscultation bilaterally.  CVS: S1, S2 no murmurs, no S3.Regular rate.  ABD: Soft non tender.   Ext: No edema  MS: Adequate ROM spine, shoulders, hips and knees.  Skin: Intact, no ulcerations or rash noted.  Psych: Good eye contact, normal affect. Memory intact not anxious or depressed appearing.  CNS: CN 2-12 intact, power,  normal throughout.no focal deficits noted.   Assessment & Plan  Essential hypertension Controlled, no change in medication DASH diet and commitment to daily physical activity for a minimum of  30 minutes discussed and encouraged, as a part of hypertension management. The importance of attaining a healthy weight is also discussed.  BP/Weight 08/08/2020 05/09/2020 04/25/2020 04/17/2020 04/10/2020 10/16/2019 05/02/2019  Systolic BP 139 123 133 155 147 128 128  Diastolic BP 77 69 79 77 82 75 82  Wt. (Lbs) 189 190 194 198 200 198.12 206  BMI 29.6 29.76 30.38 31.01 31.32 30.12 32.26       Obesity (BMI 30.0-34.9) Improving, she is congratulated on this  Patient re-educated about  the importance of commitment to a  minimum of 150 minutes of exercise per week as able.  The importance of healthy food choices with portion control discussed, as well as eating regularly and within a 12 hour window most days. The need to choose "clean , green" food 50 to 75% of the time is discussed, as well as to make water the primary drink and set a goal of 64 ounces water daily.    Weight /BMI 08/08/2020 05/09/2020 04/25/2020  WEIGHT 189 lb 190 lb 194 lb  HEIGHT 5\' 7"  5\' 7"  5\' 7"   BMI 29.6 kg/m2 29.76 kg/m2 30.38 kg/m2      Need for shingles vaccine After obtaining informed consent, the vaccine is  administered , with no adverse effect noted at the time of administration.   Hyperlipidemia LDL goal <100 Hyperlipidemia:Low fat diet discussed and encouraged.   Lipid Panel  Lab Results  Component Value Date   CHOL 182 04/05/2020   HDL 68 04/05/2020  LDLCALC 102 (H) 04/05/2020   TRIG 62 04/05/2020   CHOLHDL 2.7 04/05/2020     Needs to reduce fat Updated lab needed at/ before next visit.   Seasonal allergies Mild symptoms, start as needed chlorpheniramine, limited supply, also saline nasal flushes  Type 2 diabetes mellitus with vascular disease (HCC) Megan Villa is reminded of the importance of commitment to daily physical activity for 30 minutes or more, as able and the need to limit carbohydrate intake to 30 to 60 grams per meal to help with blood sugar control.     Megan Villa is  reminded of the importance of daily foot exam, annual eye examination, and good blood sugar, blood pressure and cholesterol control.  Diabetic Labs Latest Ref Rng & Units 07/29/2020 05/06/2020 04/05/2020 10/13/2019 04/21/2019  HbA1c 4.8 - 5.6 % 6.4(H) - 6.9(H) 6.2(H) 6.2(H)  Chol 100 - 199 mg/dL - - 326 712 458  HDL >09 mg/dL - - 68 70 64  Calc LDL 0 - 99 mg/dL - - 983(J) 825(K) 539(J)  Triglycerides 0 - 149 mg/dL - - 62 49 53  Creatinine 0.57 - 1.00 mg/dL 6.73 4.19 3.79 0.24 0.97   BP/Weight 08/08/2020 05/09/2020 04/25/2020 04/17/2020 04/10/2020 10/16/2019 05/02/2019  Systolic BP 139 123 133 155 147 128 128  Diastolic BP 77 69 79 77 82 75 82  Wt. (Lbs) 189 190 194 198 200 198.12 206  BMI 29.6 29.76 30.38 31.01 31.32 30.12 32.26   Foot/eye exam completion dates 05/09/2020  Foot Form Completion Done  improved, which is great!

## 2020-08-11 NOTE — Assessment & Plan Note (Signed)
Improving, she is congratulated on this  Patient re-educated about  the importance of commitment to a  minimum of 150 minutes of exercise per week as able.  The importance of healthy food choices with portion control discussed, as well as eating regularly and within a 12 hour window most days. The need to choose "clean , green" food 50 to 75% of the time is discussed, as well as to make water the primary drink and set a goal of 64 ounces water daily.    Weight /BMI 08/08/2020 05/09/2020 04/25/2020  WEIGHT 189 lb 190 lb 194 lb  HEIGHT 5\' 7"  5\' 7"  5\' 7"   BMI 29.6 kg/m2 29.76 kg/m2 30.38 kg/m2

## 2020-08-11 NOTE — Assessment & Plan Note (Signed)
After obtaining informed consent, the vaccine is  administered , with no adverse effect noted at the time of administration.  

## 2020-08-11 NOTE — Assessment & Plan Note (Signed)
Controlled, no change in medication DASH diet and commitment to daily physical activity for a minimum of 30 minutes discussed and encouraged, as a part of hypertension management. The importance of attaining a healthy weight is also discussed.  BP/Weight 08/08/2020 05/09/2020 04/25/2020 04/17/2020 04/10/2020 10/16/2019 05/02/2019  Systolic BP 139 123 133 155 147 128 128  Diastolic BP 77 69 79 77 82 75 82  Wt. (Lbs) 189 190 194 198 200 198.12 206  BMI 29.6 29.76 30.38 31.01 31.32 30.12 32.26

## 2020-08-15 ENCOUNTER — Telehealth: Payer: Self-pay

## 2020-08-15 NOTE — Telephone Encounter (Signed)
Patient called she states she was supposed to be referred to a dermatologist but I dont see a referral entered ph# [phone]

## 2020-08-15 NOTE — Telephone Encounter (Signed)
Please advise 

## 2020-08-16 ENCOUNTER — Other Ambulatory Visit: Payer: Self-pay | Admitting: Family Medicine

## 2020-08-16 ENCOUNTER — Telehealth: Payer: Self-pay

## 2020-08-16 DIAGNOSIS — R21 Rash and other nonspecific skin eruption: Secondary | ICD-10-CM

## 2020-08-16 MED ORDER — BENZONATATE 100 MG PO CAPS: 100.0000 mg | ORAL_CAPSULE | Freq: Two times a day (BID) | ORAL | 1 refills | Status: DC | PRN

## 2020-08-16 NOTE — Progress Notes (Signed)
[facility] perle  

## 2020-08-16 NOTE — Telephone Encounter (Signed)
Patient called said she mentioned to Dr Lodema Hong about her dry cough in her visit. Asked if something could be called into pharmacy for her cough.  [facility] [place]

## 2020-08-20 NOTE — Telephone Encounter (Signed)
Pt aware.

## 2020-09-12 ENCOUNTER — Telehealth: Payer: Self-pay | Admitting: Family Medicine

## 2020-09-12 NOTE — Telephone Encounter (Signed)
Patient called and wants to know when she had her first COVID shot.  Please call her back

## 2020-09-13 NOTE — Telephone Encounter (Signed)
04/01/19 was her 1st covid shot according to information she provided [place]

## 2020-09-13 NOTE — Telephone Encounter (Signed)
Left voicemail to call office.

## 2020-09-13 NOTE — Telephone Encounter (Signed)
Pt aware to reach out to the place she got her covid vaccine to see if she qualified for a booster and she can get a tdap at her next appt

## 2020-09-13 NOTE — Telephone Encounter (Signed)
Patient needs to know when her 1st booster shot was and when can she get her 2nd booster shot?  When can she get her tetanus shot?  Call back and can leave a detail message on her voicemail. [phone].

## 2020-09-17 ENCOUNTER — Ambulatory Visit: Payer: Self-pay | Admitting: *Deleted

## 2020-09-17 NOTE — Telephone Encounter (Signed)
Pt called in inquiring when last Covid booster was given.  Information given

## 2020-09-17 NOTE — Telephone Encounter (Signed)
Reason for Disposition  General information question, no triage required and triager able to answer question  Answer Assessment - Initial Assessment Questions 1. REASON FOR CALL or QUESTION: "What is your reason for calling today?" or "How can I best help you?" or "What question do you have that I can help answer?"     She wanted to know when her last Covid booster was given.   I looked in her chart and let her know Dec. 2021. She thanked me for my assistance.  Protocols used: Information Only Call - No Triage-A-AH

## 2020-09-18 ENCOUNTER — Encounter: Payer: Self-pay | Admitting: Family Medicine

## 2020-09-18 ENCOUNTER — Ambulatory Visit: Payer: BC Managed Care – PPO | Admitting: Family Medicine

## 2020-09-18 ENCOUNTER — Other Ambulatory Visit: Payer: Self-pay

## 2020-09-18 ENCOUNTER — Ambulatory Visit (HOSPITAL_COMMUNITY)
Admission: RE | Admit: 2020-09-18 | Discharge: 2020-09-18 | Disposition: A | Discharge status: Home / Self Care | Payer: BC Managed Care – PPO | Source: Ambulatory Visit | Attending: Family Medicine | Admitting: Family Medicine

## 2020-09-18 VITALS — BP 137/78 | HR 83 | Temp 98.9°F | Resp 18 | Ht 67.0 in | Wt 194.0 lb

## 2020-09-18 DIAGNOSIS — E669 Obesity, unspecified: Secondary | ICD-10-CM

## 2020-09-18 DIAGNOSIS — I10 Essential (primary) hypertension: Secondary | ICD-10-CM | POA: Diagnosis not present

## 2020-09-18 DIAGNOSIS — Z23 Encounter for immunization: Secondary | ICD-10-CM

## 2020-09-18 DIAGNOSIS — M1712 Unilateral primary osteoarthritis, left knee: Secondary | ICD-10-CM

## 2020-09-18 DIAGNOSIS — M25462 Effusion, left knee: Secondary | ICD-10-CM

## 2020-09-18 DIAGNOSIS — M25562 Pain in left knee: Secondary | ICD-10-CM | POA: Insufficient documentation

## 2020-09-18 MED ORDER — NAPROXEN 500 MG PO TABS: 500.0000 mg | ORAL_TABLET | Freq: Two times a day (BID) | ORAL | 0 refills | Status: DC

## 2020-09-18 MED ORDER — PREDNISONE 5 MG PO TABS: 5.0000 mg | ORAL_TABLET | Freq: Two times a day (BID) | ORAL | 0 refills | Status: AC

## 2020-09-18 NOTE — Patient Instructions (Addendum)
F/U as before , call if you need me sooner  Flu vaccine today and TDaP  Please get X ray of your left knee today, swelling is due to arthritis  Naproxen and prednisone are prescribed  Weight loss reduces arthritis  You are referred to Dr Romeo Apple in 2 to 3 weeks, if resolved, you an cancel  Thanks for choosing [facility], we consider it a privelige to serve you.

## 2020-09-19 DIAGNOSIS — M25462 Effusion, left knee: Secondary | ICD-10-CM | POA: Insufficient documentation

## 2020-09-19 NOTE — Assessment & Plan Note (Signed)
antii inflammatories prescribed and pt to get X ray of knee, if persists will refer to Ortho

## 2020-09-19 NOTE — Progress Notes (Signed)
   Megan Villa     MRN: [id number]      DOB: April 05, 1955   HPI Megan Villa is here with a 2 week h/o left knee pain and swelling and c/o stiffness, denies instability or buckling, no inciting trauma, stands for long hours on her job  ROS Denies recent fever or chills. Denies sinus pressure, nasal congestion, ear pain or sore throat. Denies chest congestion, productive cough or wheezing. Denies chest pains, palpitations and leg swelling Denies depression, anxiety or insomnia. Denies skin break down or rash.   PE  BP 137/78   Pulse 83   Temp 98.9 F (37.2 C)   Resp 18   Ht 5\' 7"  (1.702 m)   Wt 194 lb 0.6 oz (88 kg)   SpO2 96%   BMI 30.39 kg/m   Patient alert and oriented and in no cardiopulmonary distress.  HEENT: No facial asymmetry, EOMI,     Neck supple .  Chest: Clear to auscultation bilaterally.  CVS: S1, S2 no murmurs, no S3.Regular rate.     Ext: No edema  MS: Adequate ROM spine, shoulders, hips markedly reduced ROM left knee which is swollen with anterior tenderness in joint and over patellar.  Skin: Intact, no ulcerations or rash noted.  Psych: Good eye contact, normal affect. Memory intact not anxious or depressed appearing.  CNS: CN 2-12 intact, power,  normal throughout.no focal deficits noted.   Assessment & Plan  Pain and swelling of left knee antii inflammatories prescribed and pt to get X ray of knee, if persists will refer to Ortho  Essential hypertension Controlled, no change in medication DASH diet and commitment to daily physical activity for a minimum of 30 minutes discussed and encouraged, as a part of hypertension management. The importance of attaining a healthy weight is also discussed.  BP/Weight 09/18/2020 08/08/2020 05/09/2020 04/25/2020 04/17/2020 04/10/2020 10/16/2019  Systolic BP 137 139 123 133 155 147 128  Diastolic BP 78 77 69 79 77 82 75  Wt. (Lbs) 194.04 189 190 194 198 200 198.12  BMI 30.39 29.6 29.76 30.38 31.01 31.32 30.12        Obesity (BMI 30.0-34.9)  Patient re-educated about  the importance of commitment to a  minimum of 150 minutes of exercise per week as able.  The importance of healthy food choices with portion control discussed, as well as eating regularly and within a 12 hour window most days. The need to choose "clean , green" food 50 to 75% of the time is discussed, as well as to make water the primary drink and set a goal of 64 ounces water daily.    Weight /BMI 09/18/2020 08/08/2020 05/09/2020  WEIGHT 194 lb 0.6 oz 189 lb 190 lb  HEIGHT 5\' 7"  5\' 7"  5\' 7"   BMI 30.39 kg/m2 29.6 kg/m2 29.76 kg/m2

## 2020-09-19 NOTE — Assessment & Plan Note (Signed)
Controlled, no change in medication DASH diet and commitment to daily physical activity for a minimum of 30 minutes discussed and encouraged, as a part of hypertension management. The importance of attaining a healthy weight is also discussed.  BP/Weight 09/18/2020 08/08/2020 05/09/2020 04/25/2020 04/17/2020 04/10/2020 10/16/2019  Systolic BP 137 139 123 133 155 147 128  Diastolic BP 78 77 69 79 77 82 75  Wt. (Lbs) 194.04 189 190 194 198 200 198.12  BMI 30.39 29.6 29.76 30.38 31.01 31.32 30.12

## 2020-09-19 NOTE — Assessment & Plan Note (Signed)
  Patient re-educated about  the importance of commitment to a  minimum of 150 minutes of exercise per week as able.  The importance of healthy food choices with portion control discussed, as well as eating regularly and within a 12 hour window most days. The need to choose "clean , green" food 50 to 75% of the time is discussed, as well as to make water the primary drink and set a goal of 64 ounces water daily.    Weight /BMI 09/18/2020 08/08/2020 05/09/2020  WEIGHT 194 lb 0.6 oz 189 lb 190 lb  HEIGHT 5\' 7"  5\' 7"  5\' 7"   BMI 30.39 kg/m2 29.6 kg/m2 29.76 kg/m2

## 2020-09-24 ENCOUNTER — Telehealth: Payer: Self-pay

## 2020-09-24 ENCOUNTER — Other Ambulatory Visit: Payer: Self-pay

## 2020-09-24 MED ORDER — CONTOUR NEXT TEST VI STRP: ORAL_STRIP | 5 refills | Status: DC

## 2020-09-24 NOTE — Telephone Encounter (Signed)
Patient called need test strip refill  Pharmacy: [facility]

## 2020-09-24 NOTE — Telephone Encounter (Signed)
Test strip refill sent

## 2020-10-03 ENCOUNTER — Other Ambulatory Visit: Payer: Self-pay

## 2020-10-03 ENCOUNTER — Telehealth: Payer: Self-pay

## 2020-10-03 MED ORDER — POTASSIUM CHLORIDE CRYS ER 20 MEQ PO TBCR: 20.0000 meq | EXTENDED_RELEASE_TABLET | Freq: Every day | ORAL | 1 refills | Status: DC

## 2020-10-03 NOTE — Telephone Encounter (Signed)
Patient called need med refill   potassium chloride SA (KLOR-CON) 20 MEQ tablet   Pharmacy: [facility]

## 2020-10-10 ENCOUNTER — Ambulatory Visit: Payer: BC Managed Care – PPO | Admitting: Orthopedic Surgery

## 2020-10-10 ENCOUNTER — Other Ambulatory Visit: Payer: Self-pay

## 2020-10-10 ENCOUNTER — Encounter: Payer: Self-pay | Admitting: Orthopedic Surgery

## 2020-10-10 ENCOUNTER — Ambulatory Visit: Payer: BC Managed Care – PPO

## 2020-10-10 VITALS — BP 145/78 | HR 78 | Ht 67.0 in | Wt 195.0 lb

## 2020-10-10 DIAGNOSIS — M25462 Effusion, left knee: Secondary | ICD-10-CM

## 2020-10-10 MED ORDER — MELOXICAM 7.5 MG PO TABS: 7.5000 mg | ORAL_TABLET | Freq: Every day | ORAL | 5 refills | Status: DC

## 2020-10-10 NOTE — Progress Notes (Signed)
Chief Complaint  Patient presents with   Knee Pain    Lt knee pain for 1 month.    65 year old female 1 month or so pain left knee swelling no trauma.  Hurts for her to sit to stand complains of tightness and stiffness and trouble bending her knee.  Notices a popping sensation and pain in the front of the joint  She tried 3 days of prednisone naproxen still having pain and swelling  Review of systems negative for chest pain shortness of breath rash nerve symptoms  Exam.vs  General appearance is normal  BP (!) 145/78   Pulse 78   Ht 5\' 7"  (1.702 m)   Wt 195 lb (88.5 kg)   BMI 30.54 kg/m   She is awake alert and oriented x3  Mood affect normal  Gait is remarkable for antalgic gait favoring the left knee  She has a left knee effusion which is quite large she only bends any 90 degrees she can fully straighten it ligaments are stable muscle tone is normal meniscal signs are equivocal tenderness over the medial and lateral joint line posteriorly and anteriorly and in the patellofemoral area  Outside images I read as normal with effusion  Recommend aspiration injection  Meloxicam 7.5 mg a day  Return in 2 weeks  Aspiration injection left knee.  Knee was placed in extension superolateral approach was performed we used alcohol and ethyl chloride.  The knee was an 18-gauge needle aspirated 35 cc of clear yellow fluid injected 6 mg of Celestone and lidocaine 1% 4 cc  Tolerated procedure well  Patient pick up prescription for meloxicam  No orders of the defined types were placed in this encounter.

## 2020-10-10 NOTE — Patient Instructions (Signed)
Ice 2 x a day for 30 min   Depomedrol was injected   Meloxicam: take until you come back

## 2020-10-14 ENCOUNTER — Telehealth: Payer: Self-pay | Admitting: Orthopedic Surgery

## 2020-10-14 NOTE — Telephone Encounter (Signed)
Patient called with question about knee just seen by Dr Romeo Apple 10/10/20; said injection done - said has done the ice and the instructions given. States the knee is hurting, when walking, 'below the knee." Please advise.

## 2020-10-15 ENCOUNTER — Telehealth: Payer: Self-pay | Admitting: Orthopedic Surgery

## 2020-10-15 ENCOUNTER — Other Ambulatory Visit: Payer: Self-pay | Admitting: Orthopedic Surgery

## 2020-10-15 MED ORDER — TRAMADOL-ACETAMINOPHEN 37.5-325 MG PO TABS: 1.0000 | ORAL_TABLET | ORAL | 5 refills | Status: DC | PRN

## 2020-10-15 NOTE — Telephone Encounter (Signed)
Patient has a question about her medicine,  Please call her back

## 2020-10-15 NOTE — Progress Notes (Signed)
Meds ordered this encounter  Medications  . traMADol-acetaminophen (ULTRACET) 37.5-325 MG tablet    Sig: Take 1 tablet by mouth every 4 (four) hours as needed.    Dispense:  90 tablet    Refill:  5    

## 2020-10-15 NOTE — Telephone Encounter (Signed)
meloxicam (MOBIC) 7.5 MG tablet  She asked if she could take Ibuprofen with it, told her no but tylenol ok. She voiced understanding Advised 1 week for full effect of injection Continue ice topicals  and keep next appointment

## 2020-10-15 NOTE — Telephone Encounter (Signed)
I called her to advise up to a week for full effect of the injection, call and make a sooner appointment if not better then Ice/ topicals are good idea at this point.  Had to leave message. Asked her to call back if she has more questions  To Dr. Fran Lowes

## 2020-10-21 ENCOUNTER — Other Ambulatory Visit: Payer: Self-pay | Admitting: Family Medicine

## 2020-10-24 ENCOUNTER — Other Ambulatory Visit: Payer: Self-pay

## 2020-10-24 ENCOUNTER — Ambulatory Visit: Payer: BC Managed Care – PPO | Admitting: Orthopedic Surgery

## 2020-10-24 ENCOUNTER — Encounter: Payer: Self-pay | Admitting: Orthopedic Surgery

## 2020-10-24 VITALS — BP 150/79 | HR 81 | Ht 67.0 in | Wt 195.0 lb

## 2020-10-24 DIAGNOSIS — M25462 Effusion, left knee: Secondary | ICD-10-CM

## 2020-10-24 NOTE — Progress Notes (Signed)
Chief Complaint  Patient presents with   Knee Pain    Left knee better but still painful swollen     Megan Villa 65 years old we saw her for 1 month history of left knee pain we treated her with meloxicam plus aspiration injection she had already tried steroids and naproxen  Images were noted to have normal articular surfaces but joint effusion  She reports improvement in her left knee but she still has tightness when she bends the knee and pain in the front of the joint she has some crepitation as well in the patella she says she is not 100%  Examination of the joint shows indeed patellofemoral crepitance with pain anterior inferior pain is well.  Some pain at the tibial tubercle insertion with continued joint effusion and loss of extension.  She can flex the knee well it feels stable her meniscal signs are equivocal  She is now had symptoms for over 6 weeks and MRI is warranted for internal derangement chronic pain with effusion and loss of extension  Rule out meniscal tear with MRI left knee

## 2020-10-28 ENCOUNTER — Ambulatory Visit (HOSPITAL_COMMUNITY)
Admission: RE | Admit: 2020-10-28 | Discharge: 2020-10-28 | Disposition: A | Discharge status: Home / Self Care | Payer: BC Managed Care – PPO | Source: Ambulatory Visit | Attending: Family Medicine | Admitting: Family Medicine

## 2020-10-28 ENCOUNTER — Other Ambulatory Visit: Payer: Self-pay

## 2020-10-28 DIAGNOSIS — Z1231 Encounter for screening mammogram for malignant neoplasm of breast: Secondary | ICD-10-CM | POA: Insufficient documentation

## 2020-11-06 DIAGNOSIS — S83272A Complex tear of lateral meniscus, current injury, left knee, initial encounter: Secondary | ICD-10-CM | POA: Diagnosis not present

## 2020-11-06 DIAGNOSIS — S83422A Sprain of lateral collateral ligament of left knee, initial encounter: Secondary | ICD-10-CM | POA: Diagnosis not present

## 2020-11-06 DIAGNOSIS — M25462 Effusion, left knee: Secondary | ICD-10-CM | POA: Diagnosis not present

## 2020-11-07 DIAGNOSIS — Z1283 Encounter for screening for malignant neoplasm of skin: Secondary | ICD-10-CM | POA: Diagnosis not present

## 2020-11-07 DIAGNOSIS — D225 Melanocytic nevi of trunk: Secondary | ICD-10-CM | POA: Diagnosis not present

## 2020-11-07 DIAGNOSIS — L638 Other alopecia areata: Secondary | ICD-10-CM | POA: Diagnosis not present

## 2020-11-14 ENCOUNTER — Other Ambulatory Visit: Payer: Self-pay

## 2020-11-14 ENCOUNTER — Ambulatory Visit: Payer: BC Managed Care – PPO | Admitting: Orthopedic Surgery

## 2020-11-14 DIAGNOSIS — S83282D Other tear of lateral meniscus, current injury, left knee, subsequent encounter: Secondary | ICD-10-CM | POA: Diagnosis not present

## 2020-11-14 DIAGNOSIS — M25462 Effusion, left knee: Secondary | ICD-10-CM | POA: Diagnosis not present

## 2020-11-14 MED ORDER — MELOXICAM 7.5 MG PO TABS: 7.5000 mg | ORAL_TABLET | Freq: Every day | ORAL | 0 refills | Status: DC

## 2020-11-14 NOTE — Progress Notes (Signed)
MRI Knee Left  WO IV Contrast  Anatomical Region Laterality Modality  Thigh -- Magnetic Resonance  Knee -- --  Leg -- --   Impression  IMPRESSION:  Complex tear of the lateral meniscus   Moderate knee joint effusion   Low-grade sprain of the fibular collateral ligament    Electronically Signed by: Jeanne Ivan on 11/07/2020 10:50 AM Narrative  TECHNIQUE: Multiplanar, multisequence MR imaging of the left knee was performed without contrast.   COMPARISON: None   INDICATION: Effusion, left knee   FINDINGS:  . Bones: No evidence of fracture, bone lesion, or osteonecrosis.  . Soft tissues: Normal without mass or fluid collection  . Joints: Moderate knee joint effusion   . Medial meniscus: Intact  . Lateral meniscus: Complex tear and maceration of the anterior horn. Radial tear of the meniscal body  . Anterior cruciate ligament: Intact  . Posterior cruciate ligament: Intact  . Lateral collateral ligamentous structures (including fibular collateral ligament, biceps femoris tendon, popliteus tendon, and posterolateral corner structures): Mild thickening and periligamentous edema of the fibular collateral ligament  . Medial collateral ligament: Intact  . Semimembranosus tendon and pes anserinus bursa: Intact   . Osteoarthritic changes:     Marland Kitchen Medial femorotibial compartment:         . Joint space narrowing: None         . Osteophytes: None         . Articular cartilage: Intact         . Bone marrow edema (focal/diffuse/cystic): None     . Lateral femorotibial compartment:         . Joint space narrowing: None         . Osteophytes: None         . Articular cartilage: Intact         . Bone marrow edema (focal/diffuse/cystic): None     . Patellofemoral compartment:         . Joint space narrowing: None         . Osteophytes: None         . Articular cartilage: Intact         . Bone marrow edema (focal/diffuse/cystic): None   . Extensor mechanism, including quadriceps  tendon, medial and lateral patellar retinaculum, IT band, and patellar tendon: Intact. Patella well seated within well formed trochlear groove. Procedure Note  Jeanne Ivan, MD - 11/07/2020  Formatting of this note might be different from the original.  TECHNIQUE: Multiplanar, multisequence MR imaging of the left knee was performed without contrast.   COMPARISON: None   INDICATION: Effusion, left knee   FINDINGS:  . Bones: No evidence of fracture, bone lesion, or osteonecrosis.  . Soft tissues: Normal without mass or fluid collection  . Joints: Moderate knee joint effusion   . Medial meniscus: Intact  . Lateral meniscus: Complex tear and maceration of the anterior horn. Radial tear of the meniscal body  . Anterior cruciate ligament: Intact  . Posterior cruciate ligament: Intact  . Lateral collateral ligamentous structures (including fibular collateral ligament, biceps femoris tendon, popliteus tendon, and posterolateral corner structures): Mild thickening and periligamentous edema of the fibular collateral ligament  . Medial collateral ligament: Intact  . Semimembranosus tendon and pes anserinus bursa: Intact   . Osteoarthritic changes:     Marland Kitchen Medial femorotibial compartment:         . Joint space narrowing: None         . Osteophytes: None         .  Articular cartilage: Intact         . Bone marrow edema (focal/diffuse/cystic): None     . Lateral femorotibial compartment:         . Joint space narrowing: None         . Osteophytes: None         . Articular cartilage: Intact         . Bone marrow edema (focal/diffuse/cystic): None     . Patellofemoral compartment:         . Joint space narrowing: None         . Osteophytes: None         . Articular cartilage: Intact         . Bone marrow edema (focal/diffuse/cystic): None   . Extensor mechanism, including quadriceps tendon, medial and lateral patellar retinaculum, IT band, and patellar tendon: Intact. Patella well seated  within well formed trochlear groove.     IMPRESSION:  Complex tear of the lateral meniscus   Moderate knee joint effusion   Low-grade sprain of the fibular collateral ligament    Electronically Signed by: Jeanne Ivan on 11/07/2020 10:50 AM

## 2020-11-14 NOTE — Patient Instructions (Signed)
Exercises for 6 weeks  Brace for 6 weeks then as needed  Continue meloxicam for 6 weeks once a day with food

## 2020-11-14 NOTE — Progress Notes (Signed)
Chief Complaint  Patient presents with   Results    MRI left knee    HPI: 65 year old female came to [place] back in September complaining of 1 month history of pain left knee with swelling but no trauma.  She said her knee felt tight and she had swelling she noticed a popping sensation and pain in the front of the joint  She was treated with prednisone for 3 days took some naproxen still had pain  She had an aspiration and injection done with 35 cc of clear yellow fluid taken out of the joint Celestone was injected she was put on meloxicam  On her next visit in October with some improvement in the knee but it was not 100% and she wanted to proceed with the MRI  The MRI was done she does have a lateral meniscus tear her MRI was done at [facility] and its on the disc  I was able to review it and agree that she has a torn lateral meniscus    Past Medical History:  Diagnosis Date   Annual physical exam 09/11/2015   Constipation    Hyperlipidemia    Hypertension    IGT (impaired glucose tolerance)    Metabolic syndrome X 06/25/2013   Obesity    Seasonal allergies     There were no vitals taken for this visit. The images from the MRI and brought over on a disc she has a macerated lateral meniscus tear appears to be anterior horn with some radial component to it as well.   A/P  Encounter Diagnoses  Name Primary?   Effusion, left knee    Acute lateral meniscus tear of left knee, subsequent encounter Yes     she wants to try non operative treatment so let's try some meloxicam with food and home exercises   Economy hinged knee brace, did not like the feel of it  Quadricep strengthening  Continue meloxicam  Meds ordered this encounter  Medications   meloxicam (MOBIC) 7.5 MG tablet    Sig: Take 1 tablet (7.5 mg total) by mouth daily.    Dispense:  42 tablet    Refill:  0     Return as needed

## 2020-11-25 ENCOUNTER — Telehealth: Payer: Self-pay | Admitting: Family Medicine

## 2020-11-25 ENCOUNTER — Other Ambulatory Visit: Payer: Self-pay | Admitting: *Deleted

## 2020-11-25 DIAGNOSIS — I10 Essential (primary) hypertension: Secondary | ICD-10-CM

## 2020-11-25 DIAGNOSIS — E785 Hyperlipidemia, unspecified: Secondary | ICD-10-CM

## 2020-11-25 DIAGNOSIS — E1159 Type 2 diabetes mellitus with other circulatory complications: Secondary | ICD-10-CM

## 2020-11-25 NOTE — Telephone Encounter (Signed)
Orders have been placed.

## 2020-11-25 NOTE — Telephone Encounter (Signed)
Pt has appt on 11-18, no blood work , per AVS  Bloodwork shd be ordered  Fasting lipid, cmp and EGFr, hBA1C and cBC 5 days before  Nov appt

## 2020-12-04 DIAGNOSIS — I10 Essential (primary) hypertension: Secondary | ICD-10-CM | POA: Diagnosis not present

## 2020-12-04 DIAGNOSIS — E785 Hyperlipidemia, unspecified: Secondary | ICD-10-CM | POA: Diagnosis not present

## 2020-12-04 DIAGNOSIS — E1159 Type 2 diabetes mellitus with other circulatory complications: Secondary | ICD-10-CM | POA: Diagnosis not present

## 2020-12-05 LAB — CBC WITH DIFFERENTIAL/PLATELET
Basophils Absolute: 0.1 10*3/uL (ref 0.0–0.2)
Basos: 2 %
EOS (ABSOLUTE): 0.2 10*3/uL (ref 0.0–0.4)
Eos: 4 %
Hematocrit: 38.8 % (ref 34.0–46.6)
Hemoglobin: 12.8 g/dL (ref 11.1–15.9)
Immature Grans (Abs): 0 10*3/uL (ref 0.0–0.1)
Immature Granulocytes: 0 %
Lymphocytes Absolute: 2.2 10*3/uL (ref 0.7–3.1)
Lymphs: 55 %
MCH: 27.1 pg (ref 26.6–33.0)
MCHC: 33 g/dL (ref 31.5–35.7)
MCV: 82 fL (ref 79–97)
Monocytes Absolute: 0.3 10*3/uL (ref 0.1–0.9)
Monocytes: 8 %
Neutrophils Absolute: 1.2 10*3/uL — ABNORMAL LOW (ref 1.4–7.0)
Neutrophils: 31 %
Platelets: 316 10*3/uL (ref 150–450)
RBC: 4.72 x10E6/uL (ref 3.77–5.28)
RDW: 13.7 % (ref 11.7–15.4)
WBC: 4 10*3/uL (ref 3.4–10.8)

## 2020-12-05 LAB — CMP14+EGFR
ALT: 14 IU/L (ref 0–32)
AST: 19 IU/L (ref 0–40)
Albumin/Globulin Ratio: 2.1 (ref 1.2–2.2)
Albumin: 4.4 g/dL (ref 3.8–4.8)
Alkaline Phosphatase: 97 IU/L (ref 44–121)
BUN/Creatinine Ratio: 19 (ref 12–28)
BUN: 14 mg/dL (ref 8–27)
Bilirubin Total: 0.2 mg/dL (ref 0.0–1.2)
CO2: 26 mmol/L (ref 20–29)
Calcium: 9.3 mg/dL (ref 8.7–10.3)
Chloride: 106 mmol/L (ref 96–106)
Creatinine, Ser: 0.75 mg/dL (ref 0.57–1.00)
Globulin, Total: 2.1 g/dL (ref 1.5–4.5)
Glucose: 110 mg/dL — ABNORMAL HIGH (ref 70–99)
Potassium: 4.1 mmol/L (ref 3.5–5.2)
Sodium: 143 mmol/L (ref 134–144)
Total Protein: 6.5 g/dL (ref 6.0–8.5)
eGFR: 89 mL/min/{1.73_m2} (ref >59)

## 2020-12-05 LAB — LIPID PANEL
Chol/HDL Ratio: 3.1 ratio (ref 0.0–4.4)
Cholesterol, Total: 209 mg/dL — ABNORMAL HIGH (ref 100–199)
HDL: 68 mg/dL (ref >39)
LDL Chol Calc (NIH): 131 mg/dL — ABNORMAL HIGH (ref 0–99)
Triglycerides: 57 mg/dL (ref 0–149)
VLDL Cholesterol Cal: 10 mg/dL (ref 5–40)

## 2020-12-05 LAB — HEMOGLOBIN A1C
Est. average glucose Bld gHb Est-mCnc: 131 mg/dL
Hgb A1c MFr Bld: 6.2 % — ABNORMAL HIGH (ref 4.8–5.6)

## 2020-12-06 ENCOUNTER — Other Ambulatory Visit: Payer: Self-pay

## 2020-12-06 ENCOUNTER — Encounter: Payer: Self-pay | Admitting: Family Medicine

## 2020-12-06 ENCOUNTER — Ambulatory Visit: Payer: BC Managed Care – PPO | Admitting: Family Medicine

## 2020-12-06 VITALS — BP 152/77 | HR 81 | Resp 16 | Ht 67.0 in | Wt 198.0 lb

## 2020-12-06 DIAGNOSIS — E669 Obesity, unspecified: Secondary | ICD-10-CM | POA: Diagnosis not present

## 2020-12-06 DIAGNOSIS — I10 Essential (primary) hypertension: Secondary | ICD-10-CM | POA: Diagnosis not present

## 2020-12-06 DIAGNOSIS — E1159 Type 2 diabetes mellitus with other circulatory complications: Secondary | ICD-10-CM

## 2020-12-06 DIAGNOSIS — E785 Hyperlipidemia, unspecified: Secondary | ICD-10-CM

## 2020-12-06 MED ORDER — ROSUVASTATIN CALCIUM 10 MG PO TABS: 10.0000 mg | ORAL_TABLET | Freq: Every day | ORAL | 5 refills | Status: DC

## 2020-12-06 MED ORDER — AMLODIPINE BESYLATE 5 MG PO TABS: 5.0000 mg | ORAL_TABLET | Freq: Every day | ORAL | 3 refills | Status: DC

## 2020-12-06 NOTE — Patient Instructions (Addendum)
F/U mid to end January, call if you need me before  STOP lovastatin for cholesterol, new is rosuvastatin 20 daily at bedtime, also PLEASE reduce fried and fatty food  BP is still  high, continue HCTZ one daily, and New additional medication is amlodipine 5 mg one daily for  BP   It is important that you exercise regularly at least 30 minutes 5 times a week. If you develop chest pain, have severe difficulty breathing, or feel very tired, stop exercising immediately and seek medical attention  Think about what you will eat, plan ahead. Choose " clean, green, fresh or frozen" over canned, processed or packaged foods which are more sugary, salty and fatty. 70 to 75% of food eaten should be vegetables and fruit. Three meals at set times with snacks allowed between meals, but they must be fruit or vegetables. Aim to eat over a 12 hour period , example 7 am to 7 pm, and STOP after  your last meal of the day. Drink water,generally about 64 ounces per day, no other drink is as healthy. Fruit juice is best enjoyed in a healthy way, by EATING the fruit.   Congrats on Im proved blood sugar Thanks for choosing [facility], we consider it a privelige to serve you.

## 2020-12-09 ENCOUNTER — Encounter: Payer: Self-pay | Admitting: Family Medicine

## 2020-12-09 NOTE — Assessment & Plan Note (Signed)
Uncontrolled, add amlodipine  DASH diet and commitment to daily physical activity for a minimum of 30 minutes discussed and encouraged, as a part of hypertension management. The importance of attaining a healthy weight is also discussed.  BP/Weight 12/06/2020 10/24/2020 10/10/2020 09/18/2020 08/08/2020 05/09/2020 04/25/2020  Systolic BP 152 150 145 137 139 123 133  Diastolic BP 77 79 78 78 77 69 79  Wt. (Lbs) 198 195 195 194.04 189 190 194  BMI 31.01 30.54 30.54 30.39 29.6 29.76 30.38

## 2020-12-09 NOTE — Assessment & Plan Note (Signed)
Hyperlipidemia:Low fat diet discussed and encouraged.   Lipid Panel  Lab Results  Component Value Date   CHOL 209 (H) 12/04/2020   HDL 68 12/04/2020   LDLCALC 131 (H) 12/04/2020   TRIG 57 12/04/2020   CHOLHDL 3.1 12/04/2020     Change to rosuvastatin and lower fat intake

## 2020-12-09 NOTE — Assessment & Plan Note (Signed)
Megan Villa is reminded of the importance of commitment to daily physical activity for 30 minutes or more, as able and the need to limit carbohydrate intake to 30 to 60 grams per meal to help with blood sugar control.   The need to take medication as prescribed, test blood sugar as directed, and to call between visits if there is a concern that blood sugar is uncontrolled is also discussed.   Megan Villa is reminded of the importance of daily foot exam, annual eye examination, and good blood sugar, blood pressure and cholesterol control.  Diabetic Labs Latest Ref Rng & Units 12/04/2020 07/29/2020 05/06/2020 04/05/2020 10/13/2019  HbA1c 4.8 - 5.6 % 6.2(H) 6.4(H) - 6.9(H) 6.2(H)  Chol 100 - 199 mg/dL 497(W) - - 263 785  HDL >39 mg/dL 68 - - 68 70  Calc LDL 0 - 99 mg/dL 885(O) - - 277(A) 128(N)  Triglycerides 0 - 149 mg/dL 57 - - 62 49  Creatinine 0.57 - 1.00 mg/dL 8.67 6.72 0.94 7.09 6.28   BP/Weight 12/06/2020 10/24/2020 10/10/2020 09/18/2020 08/08/2020 05/09/2020 04/25/2020  Systolic BP 152 150 145 137 139 123 133  Diastolic BP 77 79 78 78 77 69 79  Wt. (Lbs) 198 195 195 194.04 189 190 194  BMI 31.01 30.54 30.54 30.39 29.6 29.76 30.38   Foot/eye exam completion dates 05/09/2020  Foot Form Completion Done

## 2020-12-09 NOTE — Assessment & Plan Note (Signed)
  Patient re-educated about  the importance of commitment to a  minimum of 150 minutes of exercise per week as able.  The importance of healthy food choices with portion control discussed, as well as eating regularly and within a 12 hour window most days. The need to choose "clean , green" food 50 to 75% of the time is discussed, as well as to make water the primary drink and set a goal of 64 ounces water daily.    Weight /BMI 12/06/2020 10/24/2020 10/10/2020  WEIGHT 198 lb 195 lb 195 lb  HEIGHT 5\' 7"  5\' 7"  5\' 7"   BMI 31.01 kg/m2 30.54 kg/m2 30.54 kg/m2

## 2020-12-09 NOTE — Progress Notes (Signed)
Megan Villa     MRN: [id number]      DOB: April 01, 1955   HPI Megan Villa is here for follow up and re-evaluation of chronic medical conditions, medication management and review of any available recent lab and radiology data.  Preventive health is updated, specifically  Cancer screening and Immunization.   Questions or concerns regarding consultations or procedures which the PT has had in the interim are  addressed. The PT denies any adverse reactions to current medications since the last visit.  There are no new concerns.  There are no specific complaints   ROS Denies recent fever or chills. Denies sinus pressure, nasal congestion, ear pain or sore throat. Denies chest congestion, productive cough or wheezing. Denies chest pains, palpitations and leg swelling Denies abdominal pain, nausea, vomiting,diarrhea or constipation.   Denies dysuria, frequency, hesitancy or incontinence. Denies joint pain, swelling and limitation in mobility. Denies headaches, seizures, numbness, or tingling. Denies depression, anxiety or insomnia. Denies skin break down or rash.   PE  BP (!) 152/77   Pulse 81   Resp 16   Ht 5\' 7"  (1.702 m)   Wt 198 lb (89.8 kg)   SpO2 98%   BMI 31.01 kg/m   Patient alert and oriented and in no cardiopulmonary distress.  HEENT: No facial asymmetry, EOMI,     Neck supple .  Chest: Clear to auscultation bilaterally.  CVS: S1, S2 no murmurs, no S3.Regular rate.  ABD: Soft non tender.   Ext: No edema  MS: Adequate ROM spine, shoulders, hips and knees.  Skin: Intact, no ulcerations or rash noted.  Psych: Good eye contact, normal affect. Memory intact not anxious or depressed appearing.  CNS: CN 2-12 intact, power,  normal throughout.no focal deficits noted.   Assessment & Plan  Essential hypertension Uncontrolled, add amlodipine  DASH diet and commitment to daily physical activity for a minimum of 30 minutes discussed and encouraged, as a part of  hypertension management. The importance of attaining a healthy weight is also discussed.  BP/Weight 12/06/2020 10/24/2020 10/10/2020 09/18/2020 08/08/2020 05/09/2020 04/25/2020  Systolic BP 152 150 145 137 139 123 133  Diastolic BP 77 79 78 78 77 69 79  Wt. (Lbs) 198 195 195 194.04 189 190 194  BMI 31.01 30.54 30.54 30.39 29.6 29.76 30.38       Type 2 diabetes mellitus with vascular disease (HCC) Megan Villa is reminded of the importance of commitment to daily physical activity for 30 minutes or more, as able and the need to limit carbohydrate intake to 30 to 60 grams per meal to help with blood sugar control.   The need to take medication as prescribed, test blood sugar as directed, and to call between visits if there is a concern that blood sugar is uncontrolled is also discussed.   Megan Villa is reminded of the importance of daily foot exam, annual eye examination, and good blood sugar, blood pressure and cholesterol control.  Diabetic Labs Latest Ref Rng & Units 12/04/2020 07/29/2020 05/06/2020 04/05/2020 10/13/2019  HbA1c 4.8 - 5.6 % 6.2(H) 6.4(H) - 6.9(H) 6.2(H)  Chol 100 - 199 mg/dL 10/15/2019) - - 237(S 283  HDL >39 mg/dL 68 - - 68 70  Calc LDL 0 - 99 mg/dL 151) - - 761(Y) 073(X)  Triglycerides 0 - 149 mg/dL 57 - - 62 49  Creatinine 0.57 - 1.00 mg/dL 106(Y 6.94 8.54 6.27 0.35   BP/Weight 12/06/2020 10/24/2020 10/10/2020 09/18/2020 08/08/2020 05/09/2020 04/25/2020  Systolic BP 152 150  145 137 139 123 133  Diastolic BP 77 79 78 78 77 69 79  Wt. (Lbs) 198 195 195 194.04 189 190 194  BMI 31.01 30.54 30.54 30.39 29.6 29.76 30.38   Foot/eye exam completion dates 05/09/2020  Foot Form Completion Done        Hyperlipidemia LDL goal <100 Hyperlipidemia:Low fat diet discussed and encouraged.   Lipid Panel  Lab Results  Component Value Date   CHOL 209 (H) 12/04/2020   HDL 68 12/04/2020   LDLCALC 131 (H) 12/04/2020   TRIG 57 12/04/2020   CHOLHDL 3.1 12/04/2020     Change to  rosuvastatin and lower fat intake  Obesity (BMI 30.0-34.9)  Patient re-educated about  the importance of commitment to a  minimum of 150 minutes of exercise per week as able.  The importance of healthy food choices with portion control discussed, as well as eating regularly and within a 12 hour window most days. The need to choose "clean , green" food 50 to 75% of the time is discussed, as well as to make water the primary drink and set a goal of 64 ounces water daily.    Weight /BMI 12/06/2020 10/24/2020 10/10/2020  WEIGHT 198 lb 195 lb 195 lb  HEIGHT 5\' 7"  5\' 7"  5\' 7"   BMI 31.01 kg/m2 30.54 kg/m2 30.54 kg/m2

## 2020-12-14 ENCOUNTER — Other Ambulatory Visit: Payer: Self-pay | Admitting: Family Medicine

## 2020-12-30 ENCOUNTER — Other Ambulatory Visit: Payer: Self-pay | Admitting: Orthopedic Surgery

## 2020-12-30 DIAGNOSIS — M25462 Effusion, left knee: Secondary | ICD-10-CM

## 2020-12-30 MED ORDER — MELOXICAM 7.5 MG PO TABS
7.5000 mg | ORAL_TABLET | Freq: Every day | ORAL | 0 refills | Status: DC
Start: 2020-12-30 — End: 2021-02-11

## 2020-12-30 NOTE — Telephone Encounter (Signed)
Patient called and states she is out of her meloxicam (MOBIC) 7.5 MG tablet and wants to know if Dr. Romeo Apple needs to refill it?  Does she need to continue taking it.  If so Pharmacy [facility] in [place]   Please call her back and let her know.

## 2021-02-05 ENCOUNTER — Telehealth: Payer: Self-pay | Admitting: Family Medicine

## 2021-02-05 ENCOUNTER — Other Ambulatory Visit: Payer: Self-pay

## 2021-02-05 MED ORDER — MICROLET LANCETS MISC: 0 refills | Status: DC

## 2021-02-05 NOTE — Telephone Encounter (Signed)
Refills sent

## 2021-02-05 NOTE — Telephone Encounter (Signed)
Pt needs refill on   Microlet Lancets MISC    [facility] [place]

## 2021-02-11 ENCOUNTER — Ambulatory Visit: Payer: BC Managed Care – PPO | Admitting: Family Medicine

## 2021-02-11 ENCOUNTER — Other Ambulatory Visit: Payer: Self-pay

## 2021-02-11 ENCOUNTER — Encounter: Payer: Self-pay | Admitting: Family Medicine

## 2021-02-11 VITALS — BP 130/72 | HR 73 | Resp 16 | Ht 67.0 in | Wt 194.0 lb

## 2021-02-11 DIAGNOSIS — E669 Obesity, unspecified: Secondary | ICD-10-CM

## 2021-02-11 DIAGNOSIS — E559 Vitamin D deficiency, unspecified: Secondary | ICD-10-CM | POA: Diagnosis not present

## 2021-02-11 DIAGNOSIS — E1159 Type 2 diabetes mellitus with other circulatory complications: Secondary | ICD-10-CM | POA: Diagnosis not present

## 2021-02-11 DIAGNOSIS — M170 Bilateral primary osteoarthritis of knee: Secondary | ICD-10-CM

## 2021-02-11 DIAGNOSIS — I10 Essential (primary) hypertension: Secondary | ICD-10-CM

## 2021-02-11 DIAGNOSIS — E785 Hyperlipidemia, unspecified: Secondary | ICD-10-CM | POA: Diagnosis not present

## 2021-02-11 DIAGNOSIS — E66811 Obesity, class 1: Secondary | ICD-10-CM

## 2021-02-11 NOTE — Patient Instructions (Addendum)
Annual exam early May, call if you need me sooner  Blood pressure is good, stay on current medication  Fasting lipid, cmp and EGFR, hBA1C, TSH and vit D 5 days before next appointment  Use OTC voltaren gel for arthritis ain and tylenol arthritis up to 2 per day  Weight loss will be MOST beneficial for your knee pain  Thanks for choosing [facility], we consider it a privelige to serve you.

## 2021-02-16 ENCOUNTER — Encounter: Payer: Self-pay | Admitting: Family Medicine

## 2021-02-16 DIAGNOSIS — M170 Bilateral primary osteoarthritis of knee: Secondary | ICD-10-CM | POA: Insufficient documentation

## 2021-02-16 NOTE — Assessment & Plan Note (Signed)
Weight loss, exercise and tylenol also topical voltaren recommended

## 2021-02-16 NOTE — Progress Notes (Signed)
Megan Villa     MRN: [id number]      DOB: 1955-12-17   HPI Megan Villa is here for follow up and re-evaluation of chronic medical conditions, medication management and review of any available recent lab and radiology data.  Preventive health is updated, specifically  Cancer screening and Immunization.   Questions or concerns regarding consultations or procedures which the PT has had in the interim are  addressed. The PT denies any adverse reactions to current medications since the last visit.  C/o knee pain, sweling and stiffnes, denies instability or near falls ROS Denies recent fever or chills. Denies sinus pressure, nasal congestion, ear pain or sore throat. Denies chest congestion, productive cough or wheezing. Denies chest pains, palpitations and leg swelling Denies abdominal pain, nausea, vomiting,diarrhea or constipation.   Denies dysuria, frequency, hesitancy or incontinence. Denies headaches, seizures, numbness, or tingling. Denies depression, anxiety or insomnia. Denies skin break down or rash.   PE  BP 130/72    Pulse 73    Resp 16    Ht 5\' 7"  (1.702 m)    Wt 194 lb (88 kg)    SpO2 97%    BMI 30.38 kg/m   Patient alert and oriented and in no cardiopulmonary distress.  HEENT: No facial asymmetry, EOMI,     Neck supple .  Chest: Clear to auscultation bilaterally.  CVS: S1, S2 no murmurs, no S3.Regular rate.  ABD: Soft non tender.   Ext: No edema  MS: Adequate ROM spine, shoulders, hips and reduced in knees. Crepitus in knees , swelling and tender Skin: Intact, no ulcerations or rash noted.  Psych: Good eye contact, normal affect. Memory intact not anxious or depressed appearing.  CNS: CN 2-12 intact, power,  normal throughout.no focal deficits noted.   Assessment & Plan  Essential hypertension DASH diet and commitment to daily physical activity for a minimum of 30 minutes discussed and encouraged, as a part of hypertension management. The importance  of attaining a healthy weight is also discussed.  BP/Weight 02/11/2021 12/06/2020 10/24/2020 10/10/2020 09/18/2020 08/08/2020 05/09/2020  Systolic BP 130 152 150 145 137 139 123  Diastolic BP 72 77 79 78 78 77 69  Wt. (Lbs) 194 198 195 195 194.04 189 190  BMI 30.38 31.01 30.54 30.54 30.39 29.6 29.76     Controlled, no change in medication   Hyperlipidemia LDL goal <100 Hyperlipidemia:Low fat diet discussed and encouraged.   Lipid Panel  Lab Results  Component Value Date   CHOL 209 (H) 12/04/2020   HDL 68 12/04/2020   LDLCALC 131 (H) 12/04/2020   TRIG 57 12/04/2020   CHOLHDL 3.1 12/04/2020     Uncontrolled Updated lab needed at/ before next visit.   Obesity (BMI 30.0-34.9)  Patient re-educated about  the importance of commitment to a  minimum of 150 minutes of exercise per week as able.  The importance of healthy food choices with portion control discussed, as well as eating regularly and within a 12 hour window most days. The need to choose "clean , green" food 50 to 75% of the time is discussed, as well as to make water the primary drink and set a goal of 64 ounces water daily.    Weight /BMI 02/11/2021 12/06/2020 10/24/2020  WEIGHT 194 lb 198 lb 195 lb  HEIGHT 5\' 7"  5\' 7"  5\' 7"   BMI 30.38 kg/m2 31.01 kg/m2 30.54 kg/m2      Type 2 diabetes mellitus with vascular disease (HCC) Megan Villa is reminded  of the importance of commitment to daily physical activity for 30 minutes or more, as able and the need to limit carbohydrate intake to 30 to 60 grams per meal to help with blood sugar control.      Megan Villa is reminded of the importance of daily foot exam, annual eye examination, and good blood sugar, blood pressure and cholesterol control. Updated lab needed at/ before next visit.   Diabetic Labs Latest Ref Rng & Units 12/04/2020 07/29/2020 05/06/2020 04/05/2020 10/13/2019  HbA1c 4.8 - 5.6 % 6.2(H) 6.4(H) - 6.9(H) 6.2(H)  Chol 100 - 199 mg/dL 979(G) - - 921 194  HDL  >39 mg/dL 68 - - 68 70  Calc LDL 0 - 99 mg/dL 174(Y) - - 814(G) 818(H)  Triglycerides 0 - 149 mg/dL 57 - - 62 49  Creatinine 0.57 - 1.00 mg/dL 6.31 4.97 0.26 3.78 5.88   BP/Weight 02/11/2021 12/06/2020 10/24/2020 10/10/2020 09/18/2020 08/08/2020 05/09/2020  Systolic BP 130 152 150 145 137 139 123  Diastolic BP 72 77 79 78 78 77 69  Wt. (Lbs) 194 198 195 195 194.04 189 190  BMI 30.38 31.01 30.54 30.54 30.39 29.6 29.76   Foot/eye exam completion dates 05/09/2020  Foot Form Completion Done        Osteoarthritis of both knees Weight loss, exercise and tylenol also topical voltaren recommended

## 2021-02-16 NOTE — Assessment & Plan Note (Signed)
Hyperlipidemia:Low fat diet discussed and encouraged.   Lipid Panel  Lab Results  Component Value Date   CHOL 209 (H) 12/04/2020   HDL 68 12/04/2020   LDLCALC 131 (H) 12/04/2020   TRIG 57 12/04/2020   CHOLHDL 3.1 12/04/2020     Uncontrolled Updated lab needed at/ before next visit.

## 2021-02-16 NOTE — Assessment & Plan Note (Signed)
[name] is reminded of the importance of commitment to daily physical activity for 30 minutes or more, as able and the need to limit carbohydrate intake to 30 to 60 grams per meal to help with blood sugar control.      Megan Villa is reminded of the importance of daily foot exam, annual eye examination, and good blood sugar, blood pressure and cholesterol control. Updated lab needed at/ before next visit.   Diabetic Labs Latest Ref Rng & Units 12/04/2020 07/29/2020 05/06/2020 04/05/2020 10/13/2019  HbA1c 4.8 - 5.6 % 6.2(H) 6.4(H) - 6.9(H) 6.2(H)  Chol 100 - 199 mg/dL 741(S) - - 239 532  HDL >39 mg/dL 68 - - 68 70  Calc LDL 0 - 99 mg/dL 023(X) - - 435(W) 861(U)  Triglycerides 0 - 149 mg/dL 57 - - 62 49  Creatinine 0.57 - 1.00 mg/dL 8.37 2.90 2.11 1.55 2.08   BP/Weight 02/11/2021 12/06/2020 10/24/2020 10/10/2020 09/18/2020 08/08/2020 05/09/2020  Systolic BP 130 152 150 145 137 139 123  Diastolic BP 72 77 79 78 78 77 69  Wt. (Lbs) 194 198 195 195 194.04 189 190  BMI 30.38 31.01 30.54 30.54 30.39 29.6 29.76   Foot/eye exam completion dates 05/09/2020  Foot Form Completion Done

## 2021-02-16 NOTE — Assessment & Plan Note (Signed)
DASH diet and commitment to daily physical activity for a minimum of 30 minutes discussed and encouraged, as a part of hypertension management. The importance of attaining a healthy weight is also discussed.  BP/Weight 02/11/2021 12/06/2020 10/24/2020 10/10/2020 09/18/2020 08/08/2020 05/09/2020  Systolic BP 130 152 150 145 137 139 123  Diastolic BP 72 77 79 78 78 77 69  Wt. (Lbs) 194 198 195 195 194.04 189 190  BMI 30.38 31.01 30.54 30.54 30.39 29.6 29.76     Controlled, no change in medication

## 2021-02-16 NOTE — Assessment & Plan Note (Signed)
°  Patient re-educated about  the importance of commitment to a  minimum of 150 minutes of exercise per week as able.  The importance of healthy food choices with portion control discussed, as well as eating regularly and within a 12 hour window most days. The need to choose "clean , green" food 50 to 75% of the time is discussed, as well as to make water the primary drink and set a goal of 64 ounces water daily.    Weight /BMI 02/11/2021 12/06/2020 10/24/2020  WEIGHT 194 lb 198 lb 195 lb  HEIGHT 5\' 7"  5\' 7"  5\' 7"   BMI 30.38 kg/m2 31.01 kg/m2 30.54 kg/m2

## 2021-03-03 ENCOUNTER — Other Ambulatory Visit: Payer: Self-pay

## 2021-03-03 ENCOUNTER — Telehealth: Payer: Self-pay | Admitting: Family Medicine

## 2021-03-03 MED ORDER — HYDROCHLOROTHIAZIDE 25 MG PO TABS: 25.0000 mg | ORAL_TABLET | Freq: Every day | ORAL | 2 refills | Status: DC

## 2021-03-03 NOTE — Telephone Encounter (Signed)
Pt needs refill on  hydrochlorothiazide (HYDRODIURIL) 25 MG tablet    [facility]

## 2021-03-03 NOTE — Telephone Encounter (Signed)
Refills sent

## 2021-03-17 ENCOUNTER — Encounter: Payer: Self-pay | Admitting: Orthopedic Surgery

## 2021-03-17 ENCOUNTER — Ambulatory Visit: Payer: BC Managed Care – PPO

## 2021-03-17 ENCOUNTER — Ambulatory Visit: Payer: BC Managed Care – PPO | Admitting: Orthopedic Surgery

## 2021-03-17 ENCOUNTER — Other Ambulatory Visit: Payer: Self-pay

## 2021-03-17 VITALS — BP 136/74 | HR 81 | Ht 67.0 in | Wt 192.2 lb

## 2021-03-17 DIAGNOSIS — G8929 Other chronic pain: Secondary | ICD-10-CM

## 2021-03-17 DIAGNOSIS — M25561 Pain in right knee: Secondary | ICD-10-CM

## 2021-03-17 DIAGNOSIS — M25461 Effusion, right knee: Secondary | ICD-10-CM

## 2021-03-17 MED ORDER — PREDNISONE 10 MG (48) PO TBPK: ORAL_TABLET | Freq: Every day | ORAL | 0 refills | Status: DC

## 2021-03-17 NOTE — Progress Notes (Signed)
Chief Complaint  Patient presents with   Knee Pain    RT knee// patient has fallen several times   66 year old female presents with lateral right knee pain  She is undergoing treatment for her left knee as well  She denies any injury but she has had several falls does not report any specific injury to the right knee  She is on meloxicam and Tylenol and they have not helped  She notices that the knee is swollen occasionally locks and gives out   BP 136/74    Pulse 81    Ht 5\' 7"  (1.702 m)    Wt 192 lb 3.2 oz (87.2 kg)    BMI 30.10 kg/m   Antalgic gait right leg  Lateral tenderness over the ITB lateral meniscus and lateral femoral condyle  No clicking is noted.  She does feel pain with McMurray's maneuver laterally the knee feels stable in the AP direction there is no collateral ligament instability she does have the joint effusion   Right knee x-ray effusion normal alignment no arthritic changes seen   DX  Encounter Diagnoses  Name Primary?   Chronic pain of right knee    Effusion, right knee Yes   Assessment and plan  She may have a meniscal tear could be iliotibial band tendinitis arthritis of the right knee not seen on x-ray  We suggested an MRI but she says she is still paying for the one she got on the left knee.  So she opted for medication to try for 2 weeks to see if the swelling and pain will go down and then if not then we can go ahead and get the MRI of the knee at that time  Meds ordered this encounter  Medications   predniSONE (STERAPRED UNI-PAK 48 TAB) 10 MG (48) TBPK tablet    Sig: Take by mouth daily. 10 MG 12 DAYS DS    Dispense:  48 tablet    Refill:  0

## 2021-03-18 ENCOUNTER — Telehealth: Payer: Self-pay

## 2021-03-18 ENCOUNTER — Other Ambulatory Visit: Payer: Self-pay

## 2021-03-18 MED ORDER — ROSUVASTATIN CALCIUM 10 MG PO TABS: 10.0000 mg | ORAL_TABLET | Freq: Every day | ORAL | 0 refills | Status: DC

## 2021-03-18 NOTE — Telephone Encounter (Signed)
Patient called said her pharmacy said her rosuvastatin (CRESTOR) 10 MG tablet  out of stock on back order nationwide at her pharmacy  Can something else be called in.  She took her last pill Sunday night.  Please contact the patient.   Ph[facility]acy: [facility]

## 2021-03-18 NOTE — Telephone Encounter (Signed)
Sent to [facility] and pt is aware

## 2021-03-27 ENCOUNTER — Other Ambulatory Visit: Payer: Self-pay | Admitting: Family Medicine

## 2021-04-03 ENCOUNTER — Ambulatory Visit: Payer: Managed Care, Other (non HMO) | Admitting: Orthopedic Surgery

## 2021-04-03 ENCOUNTER — Other Ambulatory Visit: Payer: Self-pay | Admitting: *Deleted

## 2021-04-03 ENCOUNTER — Other Ambulatory Visit: Payer: Self-pay

## 2021-04-03 DIAGNOSIS — M25561 Pain in right knee: Secondary | ICD-10-CM

## 2021-04-03 DIAGNOSIS — G8929 Other chronic pain: Secondary | ICD-10-CM | POA: Diagnosis not present

## 2021-04-03 DIAGNOSIS — M25562 Pain in left knee: Secondary | ICD-10-CM

## 2021-04-03 DIAGNOSIS — M25461 Effusion, right knee: Secondary | ICD-10-CM | POA: Diagnosis not present

## 2021-04-03 MED ORDER — POTASSIUM CHLORIDE CRYS ER 20 MEQ PO TBCR: 20.0000 meq | EXTENDED_RELEASE_TABLET | Freq: Every day | ORAL | 1 refills | Status: DC

## 2021-04-03 NOTE — Progress Notes (Signed)
Chief Complaint  ?Patient presents with  ? Knee Pain  ?  RT knee effusion, 2 wk follow up ?Has good days and bad days depending on the weather  ? ? ?Megan Villa comes in again today with bilateral knee pain this time she had been having some effusions in the right knee I think she had some issues with the left knee as well ? ?But she requested injections in both knees and I think that is reasonable ? ? ?Encounter Diagnoses  ?Name Primary?  ? Chronic pain of right knee Yes  ? Effusion, right knee   ? Acute lateral meniscus tear of left knee, subsequent encounter   ? Chronic pain of left knee   ? ? ?Procedure note for bilateral knee injections ? ?Procedure note left knee injection verbal consent was obtained to inject left knee joint ? ?Timeout was completed to confirm the site of injection ? ?The medications used were 40 mg depomedrol and 3 cc of 1% lidocaine  ?Anesthesia was provided by ethyl chloride and the skin was prepped with alcohol. ? ?After cleaning the skin with alcohol a 20-gauge needle was used to inject the left knee joint. There were no complications. A sterile bandage was applied. ? ? ?Procedure note right knee injection verbal consent was obtained to inject right knee joint ? ?Timeout was completed to confirm the site of injection ? ?The medications used were 40 mg depomedrol and 3 cc of 1% lidocaine  ?Anesthesia was provided by ethyl chloride and the skin was prepped with alcohol. ? ?After cleaning the skin with alcohol a 20-gauge needle was used to inject the right knee joint. There were no complications. A sterile bandage was applied.  ?

## 2021-04-18 ENCOUNTER — Other Ambulatory Visit: Payer: Self-pay

## 2021-04-18 MED ORDER — ROSUVASTATIN CALCIUM 10 MG PO TABS: 10.0000 mg | ORAL_TABLET | Freq: Every day | ORAL | 0 refills | Status: DC

## 2021-04-29 LAB — CMP14+EGFR
ALT: 18 IU/L (ref 0–32)
AST: 24 IU/L (ref 0–40)
Albumin/Globulin Ratio: 2 (ref 1.2–2.2)
Albumin: 4.3 g/dL (ref 3.8–4.8)
Alkaline Phosphatase: 102 IU/L (ref 44–121)
BUN/Creatinine Ratio: 19 (ref 12–28)
BUN: 13 mg/dL (ref 8–27)
Bilirubin Total: 0.3 mg/dL (ref 0.0–1.2)
CO2: 26 mmol/L (ref 20–29)
Calcium: 9.6 mg/dL (ref 8.7–10.3)
Chloride: 103 mmol/L (ref 96–106)
Creatinine, Ser: 0.68 mg/dL (ref 0.57–1.00)
Globulin, Total: 2.1 g/dL (ref 1.5–4.5)
Glucose: 108 mg/dL — ABNORMAL HIGH (ref 70–99)
Potassium: 4 mmol/L (ref 3.5–5.2)
Sodium: 141 mmol/L (ref 134–144)
Total Protein: 6.4 g/dL (ref 6.0–8.5)
eGFR: 97 mL/min/{1.73_m2} (ref >59)

## 2021-04-29 LAB — LIPID PANEL
Chol/HDL Ratio: 2.5 ratio (ref 0.0–4.4)
Cholesterol, Total: 185 mg/dL (ref 100–199)
HDL: 73 mg/dL (ref >39)
LDL Chol Calc (NIH): 104 mg/dL — ABNORMAL HIGH (ref 0–99)
Triglycerides: 41 mg/dL (ref 0–149)
VLDL Cholesterol Cal: 8 mg/dL (ref 5–40)

## 2021-04-29 LAB — HEMOGLOBIN A1C
Est. average glucose Bld gHb Est-mCnc: 137 mg/dL
Hgb A1c MFr Bld: 6.4 % — ABNORMAL HIGH (ref 4.8–5.6)

## 2021-04-29 LAB — VITAMIN D 25 HYDROXY (VIT D DEFICIENCY, FRACTURES): Vit D, 25-Hydroxy: 44.6 ng/mL (ref 30.0–100.0)

## 2021-04-29 LAB — TSH: TSH: 2.8 u[IU]/mL (ref 0.450–4.500)

## 2021-05-05 ENCOUNTER — Other Ambulatory Visit: Payer: Self-pay

## 2021-05-05 ENCOUNTER — Telehealth: Payer: Self-pay

## 2021-05-05 MED ORDER — AMLODIPINE BESYLATE 5 MG PO TABS: 5.0000 mg | ORAL_TABLET | Freq: Every day | ORAL | 5 refills | Status: DC

## 2021-05-05 NOTE — Telephone Encounter (Signed)
Patient called need med refill ?amLODipine (NORVASC) 5 MG tablet  ? ?Pharmacy: [facility] ?

## 2021-05-05 NOTE — Telephone Encounter (Signed)
Refill sent.

## 2021-05-07 ENCOUNTER — Ambulatory Visit (INDEPENDENT_AMBULATORY_CARE_PROVIDER_SITE_OTHER): Payer: Managed Care, Other (non HMO) | Admitting: Family Medicine

## 2021-05-07 ENCOUNTER — Encounter: Payer: Self-pay | Admitting: Family Medicine

## 2021-05-07 ENCOUNTER — Other Ambulatory Visit (HOSPITAL_COMMUNITY)
Admission: RE | Admit: 2021-05-07 | Discharge: 2021-05-07 | Disposition: A | Discharge status: Home / Self Care | Payer: Managed Care, Other (non HMO) | Source: Ambulatory Visit | Attending: Family Medicine | Admitting: Family Medicine

## 2021-05-07 VITALS — BP 123/74 | HR 71 | Resp 16 | Ht 68.0 in | Wt 195.0 lb

## 2021-05-07 DIAGNOSIS — E1159 Type 2 diabetes mellitus with other circulatory complications: Secondary | ICD-10-CM

## 2021-05-07 DIAGNOSIS — Z0000 Encounter for general adult medical examination without abnormal findings: Secondary | ICD-10-CM

## 2021-05-07 DIAGNOSIS — E785 Hyperlipidemia, unspecified: Secondary | ICD-10-CM

## 2021-05-07 DIAGNOSIS — Z01419 Encounter for gynecological examination (general) (routine) without abnormal findings: Secondary | ICD-10-CM

## 2021-05-07 DIAGNOSIS — Z124 Encounter for screening for malignant neoplasm of cervix: Secondary | ICD-10-CM | POA: Diagnosis present

## 2021-05-07 DIAGNOSIS — Z1231 Encounter for screening mammogram for malignant neoplasm of breast: Secondary | ICD-10-CM | POA: Diagnosis not present

## 2021-05-07 NOTE — Assessment & Plan Note (Signed)

## 2021-05-07 NOTE — Patient Instructions (Addendum)
F/U in 5 months, call if you need me sooner ? ?Pap sent ? ?Mammogram due 10/11 please schedule ? ?hBA1c, chem 7 and EGFR and cBC 1 week before next appt ? ? ?It is important that you exercise regularly at least 30 minutes 5 times a week. If you develop chest pain, have severe difficulty breathing, or feel very tired, stop exercising immediately and seek medical attention  ? ?Think about what you will eat, plan ahead. ?Choose " clean, green, fresh or frozen" over canned, processed or packaged foods which are more sugary, salty and fatty. ?70 to 75% of food eaten should be vegetables and fruit. ?Three meals at set times with snacks allowed between meals, but they must be fruit or vegetables. ?Aim to eat over a 12 hour period , example 7 am to 7 pm, and STOP after  your last meal of the day. ?Drink water,generally about 64 ounces per day, no other drink is as healthy. Fruit juice is best enjoyed in a healthy way, by EATING the fruit. ?Labs are improved, cholesterol, blood sugar is not quite as good, pleaswe reduce sweets and starchy foods ? ?Thanks for choosing [facility], we consider it a privelige to serve you. ? ?

## 2021-05-07 NOTE — Progress Notes (Signed)
? ? ?  Megan Villa     MRN: [id number]      DOB: 1955-12-02 ? ?HPI: ?Patient is in for annual physical exam. ?No other health concerns are expressed or addressed at the visit. ?Recent labs,  are reviewed. ?Immunization is reviewed , and  updated if needed. ? ? ?PE: ?BP 123/74   Pulse 71   Resp 16   Ht 5\' 8"  (1.727 m)   Wt 195 lb (88.5 kg)   SpO2 96%   BMI 29.65 kg/m?  ? ?Pleasant  female, alert and oriented x 3, in no cardio-pulmonary distress. ?Afebrile. ?HEENT ?No facial trauma or asymetry. Sinuses non tender.  ?Extra occullar muscles intact. ?External ears normal, . ?Neck: supple, no adenopathy,JVD or thyromegaly.No bruits. ? ?Chest: ?Clear to ascultation bilaterally.No crackles or wheezes. ?Non tender to palpation ? ?Breast: ?No asymetry,no masses or lumps. No tenderness. No nipple discharge or inversion. ?No axillary or supraclavicular adenopathy ? ?Cardiovascular system; ?Heart sounds normal,  S1 and  S2 ,no S3.  No murmur, or thrill. ?Apical beat not displaced ?Peripheral pulses normal. ? ?Abdomen: ?Soft, non tender, no organomegaly or masses. ?No bruits. ?Bowel sounds normal. ?No guarding, tenderness or rebound. ? ? ?GU: ?External genitalia normal female genitalia , normal female distribution of hair. No lesions. ?Urethral meatus normal in size, no  Prolapse, no lesions visibly  Present. ?Bladder non tender. ?Vagina pink and moist , with no visible lesions , discharge present . ?Adequate pelvic support no  cystocele or rectocele noted ?Cervix pink and appears healthy, no lesions or ulcerations noted, no discharge noted from os ?Uterus normal size, no adnexal masses, no cervical motion or adnexal tenderness. ? ? ?Musculoskeletal exam: ?Full ROM of spine, hips , shoulders and knees. ?No deformity ,swelling or crepitus noted. ?No muscle wasting or atrophy.  ? ?Neurologic: ?Cranial nerves 2 to 12 intact. ?Power, tone ,sensation and reflexes normal throughout. ?No disturbance in gait. No  tremor. ? ?Skin: ?Intact, no ulceration, erythema , scaling or rash noted. ?Pigmentation normal throughout ? ?Psych; ?Normal mood and affect. Judgement and concentration normal ? ? ?Assessment & Plan:  ?Annual physical exam ?Annual exam as documented. ?Counseling done  re healthy lifestyle involving commitment to 150 minutes exercise per week, heart healthy diet, and attaining healthy weight.The importance of adequate sleep also discussed. ?Regular seat belt use and home safety, is also discussed. ?Changes in health habits are decided on by the patient with goals and time frames  set for achieving them. ?Immunization and cancer screening needs are specifically addressed at this visit. ? ?

## 2021-05-09 LAB — CYTOLOGY - PAP
Adequacy: ABSENT
Comment: NEGATIVE
Diagnosis: NEGATIVE
High risk HPV: NEGATIVE

## 2021-05-12 ENCOUNTER — Other Ambulatory Visit: Payer: Self-pay

## 2021-05-12 MED ORDER — MICROLET LANCETS MISC: 0 refills | Status: DC

## 2021-05-23 ENCOUNTER — Telehealth: Payer: Self-pay | Admitting: Family Medicine

## 2021-05-23 ENCOUNTER — Other Ambulatory Visit: Payer: Self-pay

## 2021-05-23 MED ORDER — ROSUVASTATIN CALCIUM 10 MG PO TABS: 10.0000 mg | ORAL_TABLET | Freq: Every day | ORAL | 5 refills | Status: DC

## 2021-05-23 NOTE — Telephone Encounter (Signed)
Refilled

## 2021-05-23 NOTE — Telephone Encounter (Signed)
Patient needs refill on ? ? rosuvastatin (CRESTOR) 10 MG tablet  ? ?[place] ?

## 2021-08-19 ENCOUNTER — Other Ambulatory Visit: Payer: Self-pay

## 2021-08-19 MED ORDER — MICROLET LANCETS MISC
6 refills | Status: DC
Start: 2021-08-19 — End: 2022-02-20

## 2021-08-25 ENCOUNTER — Other Ambulatory Visit: Payer: Self-pay | Admitting: Family Medicine

## 2021-09-13 LAB — CBC
Hematocrit: 38.4 % (ref 34.0–46.6)
Hemoglobin: 12.3 g/dL (ref 11.1–15.9)
MCH: 26.9 pg (ref 26.6–33.0)
MCHC: 32 g/dL (ref 31.5–35.7)
MCV: 84 fL (ref 79–97)
Platelets: 312 10*3/uL (ref 150–450)
RBC: 4.58 x10E6/uL (ref 3.77–5.28)
RDW: 13.6 % (ref 11.7–15.4)
WBC: 4.5 10*3/uL (ref 3.4–10.8)

## 2021-09-13 LAB — BMP8+EGFR
BUN/Creatinine Ratio: 20 (ref 12–28)
BUN: 14 mg/dL (ref 8–27)
CO2: 23 mmol/L (ref 20–29)
Calcium: 9.3 mg/dL (ref 8.7–10.3)
Chloride: 103 mmol/L (ref 96–106)
Creatinine, Ser: 0.7 mg/dL (ref 0.57–1.00)
Glucose: 115 mg/dL — ABNORMAL HIGH (ref 70–99)
Potassium: 4.2 mmol/L (ref 3.5–5.2)
Sodium: 142 mmol/L (ref 134–144)
eGFR: 97 mL/min/{1.73_m2} (ref >59)

## 2021-09-13 LAB — HEMOGLOBIN A1C
Est. average glucose Bld gHb Est-mCnc: 137 mg/dL
Hgb A1c MFr Bld: 6.4 % — ABNORMAL HIGH (ref 4.8–5.6)

## 2021-09-17 ENCOUNTER — Ambulatory Visit (INDEPENDENT_AMBULATORY_CARE_PROVIDER_SITE_OTHER): Payer: Managed Care, Other (non HMO) | Admitting: Family Medicine

## 2021-09-17 ENCOUNTER — Encounter: Payer: Self-pay | Admitting: Family Medicine

## 2021-09-17 VITALS — BP 129/77 | HR 76 | Ht 67.0 in | Wt 194.0 lb

## 2021-09-17 DIAGNOSIS — E1159 Type 2 diabetes mellitus with other circulatory complications: Secondary | ICD-10-CM

## 2021-09-17 DIAGNOSIS — M25841 Other specified joint disorders, right hand: Secondary | ICD-10-CM

## 2021-09-17 DIAGNOSIS — Z23 Encounter for immunization: Secondary | ICD-10-CM

## 2021-09-17 DIAGNOSIS — E785 Hyperlipidemia, unspecified: Secondary | ICD-10-CM | POA: Diagnosis not present

## 2021-09-17 DIAGNOSIS — E669 Obesity, unspecified: Secondary | ICD-10-CM

## 2021-09-17 DIAGNOSIS — E559 Vitamin D deficiency, unspecified: Secondary | ICD-10-CM

## 2021-09-17 DIAGNOSIS — I10 Essential (primary) hypertension: Secondary | ICD-10-CM

## 2021-09-17 DIAGNOSIS — N951 Menopausal and female climacteric states: Secondary | ICD-10-CM

## 2021-09-17 MED ORDER — ESTROGENS CONJUGATED 0.625 MG/GM VA CREA: TOPICAL_CREAM | VAGINAL | 0 refills | Status: DC

## 2021-09-17 NOTE — Patient Instructions (Addendum)
F/u end January, call if you need me sooner  Nurse pls give pt tylenol tab 325 mg today in office  Fasting lipid, cmp and EGFR, hBA1C, cBC, tSH and vit d 5 days before Jan appointment  It is important that you exercise regularly at least 30 minutes 5 times a week. If you develop chest pain, have severe difficulty breathing, or feel very tired, stop exercising immediately and seek medical attention  Think about what you will eat, plan ahead. Choose " clean, green, fresh or frozen" over canned, processed or packaged foods which are more sugary, salty and fatty. 70 to 75% of food eaten should be vegetables and fruit. Three meals at set times with snacks allowed between meals, but they must be fruit or vegetables. Aim to eat over a 12 hour period , example 7 am to 7 pm, and STOP after  your last meal of the day. Drink water,generally about 64 ounces per day, no other drink is as healthy. Fruit juice is best enjoyed in a healthy way, by EATING the fruit.   Call for Ortho referral for cyst on right wrist if you decide you want this addresed  Cream prescribed as discussed  Thanks for choosing [facility], we consider it a privelige to serve you.

## 2021-09-17 NOTE — Progress Notes (Signed)
Prenmarin cream   Megan Villa     MRN: [id number]      DOB: [birth year]   HPI Megan Villa is here for follow up and re-evaluation of chronic medical conditions, medication management and review of any available recent lab and radiology data.  Preventive health is updated, specifically  Cancer screening and Immunization.   Questions or concerns regarding consultations or procedures which the PT has had in the interim are  addressed. The PT denies any adverse reactions to current medications since the last visit.  Painless swelling at base of right wrist C/o vaginal dryness ROS Denies recent fever or chills. Denies sinus pressure, nasal congestion, ear pain or sore throat. Denies chest congestion, productive cough or wheezing. Denies chest pains, palpitations and leg swelling Denies abdominal pain, nausea, vomiting,diarrhea or constipation.   Denies dysuria, frequency, hesitancy or incontinence. Denies joint pain, swelling and limitation in mobility. Denies headaches, seizures, numbness, or tingling. Denies depression, anxiety or insomnia. Denies skin break down or rash.   PE  BP 129/77 (BP Location: Right Arm, Patient Position: Sitting, Cuff Size: Large)   Pulse 76   Ht 5\' 7"  (1.702 m)   Wt 194 lb (88 kg)   SpO2 97%   BMI 30.38 kg/m   Patient alert and oriented and in no cardiopulmonary distress.  HEENT: No facial asymmetry, EOMI,     Neck supple .  Chest: Clear to auscultation bilaterally.  CVS: S1, S2 no murmurs, no S3.Regular rate.  ABD: Soft non tender.   Ext: No edema  MS: Adequate ROM spine, shoulders, hips and knees.Tender cyst at base of right thumb  Skin: Intact, no ulcerations or rash noted.  Psych: Good eye contact, normal affect. Memory intact not anxious or depressed appearing.  CNS: CN 2-12 intact, power,  normal throughout.no focal deficits noted.   Assessment & Plan  Essential hypertension Controlled, no change in medication DASH diet and  commitment to daily physical activity for a minimum of 30 minutes discussed and encouraged, as a part of hypertension management. The importance of attaining a healthy weight is also discussed.     09/17/2021    4:00 PM 05/07/2021    4:22 PM 03/17/2021    3:07 PM 02/11/2021    4:10 PM 02/11/2021    3:51 PM 12/06/2020    3:54 PM 10/24/2020    4:24 PM  BP/Weight  Systolic BP 129 123 136 130 147 152 150  Diastolic BP 77 74 74 72 78 77 79  Wt. (Lbs) 194 195 192.2  194 198 195  BMI 30.38 kg/m2 29.65 kg/m2 30.1 kg/m2  30.38 kg/m2 31.01 kg/m2 30.54 kg/m2       Hyperlipidemia LDL goal <100 Hyperlipidemia:Low fat diet discussed and encouraged.   Lipid Panel  Lab Results  Component Value Date   CHOL 185 04/28/2021   HDL 73 04/28/2021   LDLCALC 104 (H) 04/28/2021   TRIG 41 04/28/2021   CHOLHDL 2.5 04/28/2021     Need to reduce fat in diet Updated lab needed at/ before next visit.   Obesity (BMI 30.0-34.9)  Patient re-educated about  the importance of commitment to a  minimum of 150 minutes of exercise per week as able.  The importance of healthy food choices with portion control discussed, as well as eating regularly and within a 12 hour window most days. The need to choose "clean , green" food 50 to 75% of the time is discussed, as well as to make water the primary drink and  set a goal of 64 ounces water daily.       09/17/2021    4:00 PM 05/07/2021    4:22 PM 03/17/2021    3:07 PM  Weight /BMI  Weight 194 lb 195 lb 192 lb 3.2 oz  Height 5\' 7"  (1.702 m) 5\' 8"  (1.727 m) 5\' 7"  (1.702 m)  BMI 30.38 kg/m2 29.65 kg/m2 30.1 kg/m2      Type 2 diabetes mellitus with vascular disease (HCC) Megan Villa is reminded of the importance of commitment to daily physical activity for 30 minutes or more, as able and the need to limit carbohydrate intake to 30 to 60 grams per meal to help with blood sugar control.   Megan Villa is reminded of the importance of daily foot exam, annual eye  examination, and good blood sugar, blood pressure and cholesterol control.     Latest Ref Rng & Units 09/12/2021    8:09 AM 04/28/2021    9:20 AM 12/04/2020    8:06 AM 07/29/2020    9:37 AM 05/06/2020    9:20 AM  Diabetic Labs  HbA1c 4.8 - 5.6 % 6.4  6.4  6.2  6.4    Chol 100 - 199 mg/dL  12/06/2020  09/29/2020     HDL 05/08/2020 mg/dL  73  68     Calc LDL 0 - 99 mg/dL  983  382     Triglycerides 0 - 149 mg/dL  41  57     Creatinine 0.57 - 1.00 mg/dL >50  539  767  3.41  0.79       09/17/2021    4:00 PM 05/07/2021    4:22 PM 03/17/2021    3:07 PM 02/11/2021    4:10 PM 02/11/2021    3:51 PM 12/06/2020    3:54 PM 10/24/2020    4:24 PM  BP/Weight  Systolic BP 129 123 136 130 147 152 150  Diastolic BP 77 74 74 72 78 77 79  Wt. (Lbs) 194 195 192.2  194 198 195  BMI 30.38 kg/m2 29.65 kg/m2 30.1 kg/m2  30.38 kg/m2 31.01 kg/m2 30.54 kg/m2      05/07/2021    4:00 PM 05/09/2020    8:00 AM  Foot/eye exam completion dates  Foot Form Completion Done Done        Vaginal dryness, menopausal Sparing amts of premarin cream 3 times weekly  Cyst of joint of right hand mldly tender cyst at base of right thumb, will watch for now

## 2021-09-22 ENCOUNTER — Encounter: Payer: Self-pay | Admitting: Family Medicine

## 2021-09-22 DIAGNOSIS — M25841 Other specified joint disorders, right hand: Secondary | ICD-10-CM | POA: Insufficient documentation

## 2021-09-22 DIAGNOSIS — N951 Menopausal and female climacteric states: Secondary | ICD-10-CM | POA: Insufficient documentation

## 2021-09-22 NOTE — Assessment & Plan Note (Signed)
mldly tender cyst at base of right thumb, will watch for now

## 2021-09-22 NOTE — Assessment & Plan Note (Signed)
Sparing amts of premarin cream 3 times weekly

## 2021-09-22 NOTE — Assessment & Plan Note (Signed)
Megan Villa is reminded of the importance of commitment to daily physical activity for 30 minutes or more, as able and the need to limit carbohydrate intake to 30 to 60 grams per meal to help with blood sugar control.   Megan Villa is reminded of the importance of daily foot exam, annual eye examination, and good blood sugar, blood pressure and cholesterol control.     Latest Ref Rng & Units 09/12/2021    8:09 AM 04/28/2021    9:20 AM 12/04/2020    8:06 AM 07/29/2020    9:37 AM 05/06/2020    9:20 AM  Diabetic Labs  HbA1c 4.8 - 5.6 % 6.4  6.4  6.2  6.4    Chol 100 - 199 mg/dL  299  371     HDL >69 mg/dL  73  68     Calc LDL 0 - 99 mg/dL  678  938     Triglycerides 0 - 149 mg/dL  41  57     Creatinine 0.57 - 1.00 mg/dL 1.01  7.51  0.25  8.52  0.79       09/17/2021    4:00 PM 05/07/2021    4:22 PM 03/17/2021    3:07 PM 02/11/2021    4:10 PM 02/11/2021    3:51 PM 12/06/2020    3:54 PM 10/24/2020    4:24 PM  BP/Weight  Systolic BP 129 123 136 130 147 152 150  Diastolic BP 77 74 74 72 78 77 79  Wt. (Lbs) 194 195 192.2  194 198 195  BMI 30.38 kg/m2 29.65 kg/m2 30.1 kg/m2  30.38 kg/m2 31.01 kg/m2 30.54 kg/m2      05/07/2021    4:00 PM 05/09/2020    8:00 AM  Foot/eye exam completion dates  Foot Form Completion Done Done

## 2021-09-22 NOTE — Assessment & Plan Note (Signed)
Controlled, no change in medication DASH diet and commitment to daily physical activity for a minimum of 30 minutes discussed and encouraged, as a part of hypertension management. The importance of attaining a healthy weight is also discussed.     09/17/2021    4:00 PM 05/07/2021    4:22 PM 03/17/2021    3:07 PM 02/11/2021    4:10 PM 02/11/2021    3:51 PM 12/06/2020    3:54 PM 10/24/2020    4:24 PM  BP/Weight  Systolic BP 129 123 136 130 147 152 150  Diastolic BP 77 74 74 72 78 77 79  Wt. (Lbs) 194 195 192.2  194 198 195  BMI 30.38 kg/m2 29.65 kg/m2 30.1 kg/m2  30.38 kg/m2 31.01 kg/m2 30.54 kg/m2

## 2021-09-22 NOTE — Assessment & Plan Note (Signed)
Hyperlipidemia:Low fat diet discussed and encouraged.   Lipid Panel  Lab Results  Component Value Date   CHOL 185 04/28/2021   HDL 73 04/28/2021   LDLCALC 104 (H) 04/28/2021   TRIG 41 04/28/2021   CHOLHDL 2.5 04/28/2021     Need to reduce fat in diet Updated lab needed at/ before next visit.

## 2021-09-22 NOTE — Assessment & Plan Note (Signed)
  Patient re-educated about  the importance of commitment to a  minimum of 150 minutes of exercise per week as able.  The importance of healthy food choices with portion control discussed, as well as eating regularly and within a 12 hour window most days. The need to choose "clean , green" food 50 to 75% of the time is discussed, as well as to make water the primary drink and set a goal of 64 ounces water daily.       09/17/2021    4:00 PM 05/07/2021    4:22 PM 03/17/2021    3:07 PM  Weight /BMI  Weight 194 lb 195 lb 192 lb 3.2 oz  Height 5\' 7"  (1.702 m) 5\' 8"  (1.727 m) 5\' 7"  (1.702 m)  BMI 30.38 kg/m2 29.65 kg/m2 30.1 kg/m2

## 2021-09-30 ENCOUNTER — Other Ambulatory Visit: Payer: Self-pay | Admitting: Family Medicine

## 2021-10-09 ENCOUNTER — Telehealth: Payer: Self-pay | Admitting: Family Medicine

## 2021-10-09 ENCOUNTER — Other Ambulatory Visit: Payer: Self-pay | Admitting: Family Medicine

## 2021-10-09 NOTE — Telephone Encounter (Signed)
Megan Villa patient     Pt called stating she is needing a refill on test strips. Can you please refill?   glucose blood (CONTOUR NEXT TEST) test strip    [facility]

## 2021-10-10 ENCOUNTER — Other Ambulatory Visit: Payer: Self-pay

## 2021-10-10 MED ORDER — CONTOUR NEXT TEST VI STRP: ORAL_STRIP | 5 refills | Status: DC

## 2021-10-10 NOTE — Telephone Encounter (Signed)
Refill sent.

## 2021-10-30 ENCOUNTER — Inpatient Hospital Stay (HOSPITAL_COMMUNITY): Admission: RE | Admit: 2021-10-30 | Payer: Managed Care, Other (non HMO) | Source: Ambulatory Visit

## 2021-10-31 ENCOUNTER — Ambulatory Visit (HOSPITAL_COMMUNITY)
Admission: RE | Admit: 2021-10-31 | Discharge: 2021-10-31 | Disposition: A | Discharge status: Home / Self Care | Payer: Managed Care, Other (non HMO) | Source: Ambulatory Visit | Attending: Family Medicine | Admitting: Family Medicine

## 2021-10-31 DIAGNOSIS — Z1231 Encounter for screening mammogram for malignant neoplasm of breast: Secondary | ICD-10-CM | POA: Insufficient documentation

## 2021-11-03 ENCOUNTER — Ambulatory Visit: Payer: Managed Care, Other (non HMO) | Admitting: Orthopedic Surgery

## 2021-11-03 ENCOUNTER — Encounter: Payer: Self-pay | Admitting: Orthopedic Surgery

## 2021-11-03 VITALS — BP 128/75 | HR 79 | Ht 67.0 in | Wt 192.0 lb

## 2021-11-03 DIAGNOSIS — M65312 Trigger thumb, left thumb: Secondary | ICD-10-CM | POA: Diagnosis not present

## 2021-11-03 MED ORDER — METHYLPREDNISOLONE ACETATE 40 MG/ML IJ SUSP
40.0000 mg | Freq: Once | INTRAMUSCULAR | Status: AC
  Administered 2021-11-03: 40 mg via INTRA_ARTICULAR

## 2021-11-03 NOTE — Progress Notes (Signed)
Chief Complaint  Patient presents with   Hand Pain    Left thumb triggering   New problem  66 year old female with pain over the A1 pulley of the left thumb with triggering and popping  System review negative for any vascular compromise to the skin she does not feel a little bit of tingling and burning around the knot which is formed over the A1 pulley  BP 128/75   Pulse 79   Ht 5\' 7"  (1.702 m)   Wt 192 lb (87.1 kg)   BMI 30.07 kg/m   Left thumb there is a nodular area over the A1 pulley she demonstrates clicking popping and catching neurovascular exam is intact flexor tendon is normal  Treatment options splinting and NSAIDs versus injection  She opted for cortisone injection  Left Trigger thumb injection Medication  1 mL of 40 mg Depo-Medrol  2 mL of 1% lidocaine plain  Ethyl chloride for anesthesia  Verbal consent was obtained timeout was taken to confirm the injection site as left thumb  Alcohol was used to prepare the skin along with ethyl chloride and then the injection was made at the A1 pulley there were no complications

## 2021-11-27 ENCOUNTER — Other Ambulatory Visit: Payer: Self-pay

## 2021-11-27 ENCOUNTER — Telehealth: Payer: Self-pay | Admitting: Family Medicine

## 2021-11-27 MED ORDER — HYDROCHLOROTHIAZIDE 25 MG PO TABS: 25.0000 mg | ORAL_TABLET | Freq: Every day | ORAL | 0 refills | Status: DC

## 2021-11-27 MED ORDER — ROSUVASTATIN CALCIUM 10 MG PO TABS: 10.0000 mg | ORAL_TABLET | Freq: Every day | ORAL | 5 refills | Status: DC

## 2021-11-27 MED ORDER — AMLODIPINE BESYLATE 5 MG PO TABS: 5.0000 mg | ORAL_TABLET | Freq: Every day | ORAL | 0 refills | Status: DC

## 2021-11-27 NOTE — Telephone Encounter (Signed)
Refills sent

## 2021-11-27 NOTE — Telephone Encounter (Signed)
Patient need refills  rosuvastatin (CRESTOR) 10 MG table   amLODipine (NORVASC) 5 MG tablet   hydrochlorothiazide (HYDRODIURIL) 25 MG tablet   Pharmacy: [facility]

## 2021-12-24 ENCOUNTER — Telehealth: Payer: Self-pay | Admitting: Family Medicine

## 2021-12-24 ENCOUNTER — Other Ambulatory Visit: Payer: Self-pay

## 2021-12-24 MED ORDER — POTASSIUM CHLORIDE CRYS ER 20 MEQ PO TBCR: 20.0000 meq | EXTENDED_RELEASE_TABLET | Freq: Every day | ORAL | 0 refills | Status: DC

## 2021-12-24 NOTE — Telephone Encounter (Signed)
Prescription Request  12/24/2021  Is this a "Controlled Substance" medicine? No  LOV: 09/17/2021  What is the name of the medication or equipment? potassium chloride SA (KLOR-CON M) 20 MEQ tablet   Have you contacted your pharmacy to request a refill? Yes   Which pharmacy would you like this sent to?   [facility]  Patient notified that their request is being sent to the clinical staff for review and that they should receive a response within 2 business days.   Please advise at Mobile [phone] (mobile)

## 2021-12-24 NOTE — Telephone Encounter (Signed)
Refills sent

## 2021-12-31 ENCOUNTER — Ambulatory Visit (INDEPENDENT_AMBULATORY_CARE_PROVIDER_SITE_OTHER): Payer: Managed Care, Other (non HMO) | Admitting: Family Medicine

## 2021-12-31 DIAGNOSIS — J302 Other seasonal allergic rhinitis: Secondary | ICD-10-CM

## 2021-12-31 MED ORDER — BENZONATATE 100 MG PO CAPS: 100.0000 mg | ORAL_CAPSULE | Freq: Two times a day (BID) | ORAL | 0 refills | Status: DC | PRN

## 2021-12-31 MED ORDER — AZELASTINE HCL 0.1 % NA SOLN: 2.0000 | Freq: Two times a day (BID) | NASAL | 2 refills | Status: DC

## 2021-12-31 MED ORDER — PREDNISONE 5 MG PO TABS: 5.0000 mg | ORAL_TABLET | Freq: Two times a day (BID) | ORAL | 0 refills | Status: AC

## 2021-12-31 NOTE — Patient Instructions (Signed)
F/u in January as before, call if you need me sooner  You are being treated for uncontrolled allergy symptoms, prednione, Astelin and tessalon perles are prescribed  Please get fasting lipid, cmp and EGFr and HBA1C 3 to 5 days before next appointment, lab order is being mailed along with your discharge summary sheet  Best for the season and 2024!  Thanks for choosing [facility], we consider it a privelige to serve you.

## 2021-12-31 NOTE — Progress Notes (Signed)
Virtual Visit via Telephone Note  I connected with Megan Villa on 12/31/21 at 10:00 AM EST by telephone and verified that I am speaking with the correct person using two identifiers.  Location: Patient: home Provider: office   I discussed the limitations, risks, security and privacy concerns of performing an evaluation and management service by telephone and the availability of in person appointments. I also discussed with the patient that there may be a patient responsible charge related to this service. The patient expressed understanding and agreed to proceed.   History of Present Illness: Hoarse , increased post nasal drainage, clear, chest has congestion but no cough, no fever, chill or body aches   Observations/Objective:  Good communication with no confusion and intact memory. No signs of respiratory distress during speech  Assessment and Plan: Seasonal allergies Uncontrolled with drainage, sore throat an cough x 3 days  Tessalon perles, astelin and prednisone prescribed No symptoms of infection reported   Follow Up Instructions:    I discussed the assessment and treatment plan with the patient. The patient was provided an opportunity to ask questions and all were answered. The patient agreed with the plan and demonstrated an understanding of the instructions.   The patient was advised to call back or seek an in-person evaluation if the symptoms worsen or if the condition fails to improve as anticipated.  I provided 12 minutes of non-face-to-face time during this encounter.   Syliva Overman, MD

## 2021-12-31 NOTE — Assessment & Plan Note (Signed)
Uncontrolled with drainage, sore throat an cough x 3 days  Tessalon perles, astelin and prednisone prescribed No symptoms of infection reported

## 2022-01-01 ENCOUNTER — Other Ambulatory Visit: Payer: Self-pay

## 2022-01-01 DIAGNOSIS — E785 Hyperlipidemia, unspecified: Secondary | ICD-10-CM

## 2022-01-01 DIAGNOSIS — E1159 Type 2 diabetes mellitus with other circulatory complications: Secondary | ICD-10-CM

## 2022-01-01 DIAGNOSIS — I10 Essential (primary) hypertension: Secondary | ICD-10-CM

## 2022-01-03 ENCOUNTER — Encounter: Payer: Self-pay | Admitting: Family Medicine

## 2022-02-17 ENCOUNTER — Ambulatory Visit: Payer: Managed Care, Other (non HMO) | Admitting: Family Medicine

## 2022-02-20 ENCOUNTER — Other Ambulatory Visit: Payer: Self-pay

## 2022-02-20 ENCOUNTER — Telehealth: Payer: Self-pay | Admitting: Family Medicine

## 2022-02-20 DIAGNOSIS — E785 Hyperlipidemia, unspecified: Secondary | ICD-10-CM | POA: Diagnosis not present

## 2022-02-20 DIAGNOSIS — E1159 Type 2 diabetes mellitus with other circulatory complications: Secondary | ICD-10-CM

## 2022-02-20 DIAGNOSIS — I10 Essential (primary) hypertension: Secondary | ICD-10-CM | POA: Diagnosis not present

## 2022-02-20 MED ORDER — MICROLET LANCETS MISC: 6 refills | Status: DC

## 2022-02-20 MED ORDER — BLOOD GLUCOSE METER KIT: PACK | 0 refills | Status: DC

## 2022-02-20 MED ORDER — CONTOUR NEXT TEST VI STRP: ORAL_STRIP | 5 refills | Status: DC

## 2022-02-20 NOTE — Telephone Encounter (Signed)
Rx faxed

## 2022-02-20 NOTE — Telephone Encounter (Signed)
Megan Villa w/[facility] (phone [phone]) (fax [phone]) called stating he needs a script for diabetic supplies.

## 2022-02-21 LAB — LIPID PANEL
Chol/HDL Ratio: 2.6 ratio (ref 0.0–4.4)
Cholesterol, Total: 171 mg/dL (ref 100–199)
HDL: 67 mg/dL (ref >39)
LDL Chol Calc (NIH): 93 mg/dL (ref 0–99)
Triglycerides: 58 mg/dL (ref 0–149)
VLDL Cholesterol Cal: 11 mg/dL (ref 5–40)

## 2022-02-21 LAB — HEMOGLOBIN A1C
Est. average glucose Bld gHb Est-mCnc: 143 mg/dL
Hgb A1c MFr Bld: 6.6 % — ABNORMAL HIGH (ref 4.8–5.6)

## 2022-02-21 LAB — CMP14+EGFR
ALT: 9 IU/L (ref 0–32)
AST: 14 IU/L (ref 0–40)
Albumin/Globulin Ratio: 1.8 (ref 1.2–2.2)
Albumin: 4.4 g/dL (ref 3.9–4.9)
Alkaline Phosphatase: 93 IU/L (ref 44–121)
BUN/Creatinine Ratio: 17 (ref 12–28)
BUN: 13 mg/dL (ref 8–27)
Bilirubin Total: 0.3 mg/dL (ref 0.0–1.2)
CO2: 24 mmol/L (ref 20–29)
Calcium: 9.4 mg/dL (ref 8.7–10.3)
Chloride: 103 mmol/L (ref 96–106)
Creatinine, Ser: 0.76 mg/dL (ref 0.57–1.00)
Globulin, Total: 2.5 g/dL (ref 1.5–4.5)
Glucose: 120 mg/dL — ABNORMAL HIGH (ref 70–99)
Potassium: 4.1 mmol/L (ref 3.5–5.2)
Sodium: 141 mmol/L (ref 134–144)
Total Protein: 6.9 g/dL (ref 6.0–8.5)
eGFR: 87 mL/min/{1.73_m2} (ref >59)

## 2022-02-24 ENCOUNTER — Ambulatory Visit (INDEPENDENT_AMBULATORY_CARE_PROVIDER_SITE_OTHER): Payer: Medicare Other | Admitting: Family Medicine

## 2022-02-24 ENCOUNTER — Encounter: Payer: Self-pay | Admitting: Family Medicine

## 2022-02-24 VITALS — BP 131/75 | HR 71 | Ht 67.0 in | Wt 191.0 lb

## 2022-02-24 DIAGNOSIS — E669 Obesity, unspecified: Secondary | ICD-10-CM

## 2022-02-24 DIAGNOSIS — I10 Essential (primary) hypertension: Secondary | ICD-10-CM | POA: Diagnosis not present

## 2022-02-24 DIAGNOSIS — E1159 Type 2 diabetes mellitus with other circulatory complications: Secondary | ICD-10-CM

## 2022-02-24 DIAGNOSIS — E785 Hyperlipidemia, unspecified: Secondary | ICD-10-CM | POA: Diagnosis not present

## 2022-02-24 DIAGNOSIS — M65312 Trigger thumb, left thumb: Secondary | ICD-10-CM | POA: Diagnosis not present

## 2022-02-24 NOTE — Progress Notes (Signed)
Megan Villa     MRN: [id number]      DOB: [birth year]   HPI Megan Villa is here for follow up and re-evaluation of chronic medical conditions, medication management and review of any available recent lab and radiology data.  Preventive health is updated, specifically  Cancer screening and Immunization.   Concerned about weight gain and worsened labs as far as blood sugar is concerned, does state that she has been making poor food choices since retirement, but will change this The PT denies any adverse reactions to current medications since the last visit.  C/o left trigger thumb x 2 week  ROS Denies recent fever or chills. Denies sinus pressure, nasal congestion, ear pain or sore throat. Denies chest congestion, productive cough or wheezing. Denies chest pains, palpitations and leg swelling Denies abdominal pain, nausea, vomiting,diarrhea or constipation.   Denies dysuria, frequency, hesitancy or incontinence. Denies headaches, seizures, numbness, or tingling. Denies depression, anxiety or insomnia. Denies skin break down or rash.   PE  BP 131/75 (BP Location: Right Arm, Patient Position: Sitting, Cuff Size: Large)   Pulse 71   Ht 5\' 7"  (1.702 m)   Wt 191 lb 0.6 oz (86.7 kg)   SpO2 97%   BMI 29.92 kg/m   Patient alert and oriented and in no cardiopulmonary distress.  HEENT: No facial asymmetry, EOMI,     Neck supple .  Chest: Clear to auscultation bilaterally.  CVS: S1, S2 no murmurs, no S3.Regular rate.  ABD: Soft non tender.   Ext: No edema  MS: Adequate ROM spine, shoulders, hips and knees.trigger left thumb  Skin: Intact, no ulcerations or rash noted.  Psych: Good eye contact, normal affect. Memory intact not anxious or depressed appearing.  CNS: CN 2-12 intact, power,  normal throughout.no focal deficits noted.   Assessment & Plan  Trigger thumb, left thumb Triggering again x 2 weeks, Ortho referral  Type 2 diabetes mellitus with vascular disease  ([place]) Megan Villa is reminded of the importance of commitment to daily physical activity for 30 minutes or more, as able and the need to limit carbohydrate intake to 30 to 60 grams per meal to help with blood sugar control.   The need to take medication as prescribed, test blood sugar as directed, and to call between visits if there is a concern that blood sugar is uncontrolled is also discussed.   Megan Villa is reminded of the importance of daily foot exam, annual eye examination, and good blood sugar, blood pressure and cholesterol control. Deteriorated refer diabetic ed     Latest Ref Rng & Units 02/20/2022    8:25 AM 09/12/2021    8:09 AM 04/28/2021    9:20 AM 12/04/2020    8:06 AM 07/29/2020    9:37 AM  Diabetic Labs  HbA1c 4.8 - 5.6 % 6.6  6.4  6.4  6.2  6.4   Chol 100 - 199 mg/dL 171   185  209    HDL >39 mg/dL 67   73  68    Calc LDL 0 - 99 mg/dL 93   104  131    Triglycerides 0 - 149 mg/dL 58   41  57    Creatinine 0.57 - 1.00 mg/dL 0.76  0.70  0.68  0.75  0.74       02/24/2022    8:33 AM 11/03/2021    3:57 PM 09/17/2021    4:00 PM 05/07/2021    4:22 PM 03/17/2021  3:07 PM 02/11/2021    4:10 PM 02/11/2021    3:51 PM  BP/Weight  Systolic BP 161 096 045 409 [phone]  Diastolic BP 75 75 77 74 74 72 78  Wt. (Lbs) 191.04 192 194 195 192.2  194  BMI 29.92 kg/m2 30.07 kg/m2 30.38 kg/m2 29.65 kg/m2 30.1 kg/m2  30.38 kg/m2      05/07/2021    4:00 PM 05/09/2020    8:00 AM  Foot/eye exam completion dates  Foot Form Completion Done Done        Essential hypertension Not at goal, change in lifestyle only, no med change DASH diet and commitment to daily physical activity for a minimum of 30 minutes discussed and encouraged, as a part of hypertension management. The importance of attaining a healthy weight is also discussed.     02/24/2022    8:33 AM 11/03/2021    3:57 PM 09/17/2021    4:00 PM 05/07/2021    4:22 PM 03/17/2021    3:07 PM 02/11/2021    4:10 PM 02/11/2021     3:51 PM  BP/Weight  Systolic BP 956 213 086 [phone] 528  Diastolic BP 75 75 77 74 74 72 78  Wt. (Lbs) 191.04 192 194 195 192.2  194  BMI 29.92 kg/m2 30.07 kg/m2 30.38 kg/m2 29.65 kg/m2 30.1 kg/m2  30.38 kg/m2       Obesity (BMI 30.0-34.9)  Patient re-educated about  the importance of commitment to a  minimum of 150 minutes of exercise per week as able.  The importance of healthy food choices with portion control discussed, as well as eating regularly and within a 12 hour window most days. The need to choose "clean , green" food 50 to 75% of the time is discussed, as well as to make water the primary drink and set a goal of 64 ounces water daily.       02/24/2022    8:33 AM 11/03/2021    3:57 PM 09/17/2021    4:00 PM  Weight /BMI  Weight 191 lb 0.6 oz 192 lb 194 lb  Height 5\' 7"  (1.702 m) 5\' 7"  (1.702 m) 5\' 7"  (1.702 m)  BMI 29.92 kg/m2 30.07 kg/m2 30.38 kg/m2    Unchnaged  Hyperlipidemia LDL goal <100 Hyperlipidemia:Low fat diet discussed and encouraged.   Lipid Panel  Lab Results  Component Value Date   CHOL 171 02/20/2022   HDL 67 02/20/2022   LDLCALC 93 02/20/2022   TRIG 58 02/20/2022   CHOLHDL 2.6 02/20/2022     Controlled, no change in medication

## 2022-02-24 NOTE — Assessment & Plan Note (Signed)
Triggering again x 2 weeks, Ortho referral

## 2022-02-24 NOTE — Assessment & Plan Note (Signed)
Not at goal, change in lifestyle only, no med change DASH diet and commitment to daily physical activity for a minimum of 30 minutes discussed and encouraged, as a part of hypertension management. The importance of attaining a healthy weight is also discussed.     02/24/2022    8:33 AM 11/03/2021    3:57 PM 09/17/2021    4:00 PM 05/07/2021    4:22 PM 03/17/2021    3:07 PM 02/11/2021    4:10 PM 02/11/2021    3:51 PM  BP/Weight  Systolic BP 979 480 165 [phone] 867  Diastolic BP 75 75 77 74 74 72 78  Wt. (Lbs) 191.04 192 194 195 192.2  194  BMI 29.92 kg/m2 30.07 kg/m2 30.38 kg/m2 29.65 kg/m2 30.1 kg/m2  30.38 kg/m2

## 2022-02-24 NOTE — Assessment & Plan Note (Addendum)
[name] is reminded of the importance of commitment to daily physical activity for 30 minutes or more, as able and the need to limit carbohydrate intake to 30 to 60 grams per meal to help with blood sugar control.   The need to take medication as prescribed, test blood sugar as directed, and to call between visits if there is a concern that blood sugar is uncontrolled is also discussed.   [name] is reminded of the importance of daily foot exam, annual eye examination, and good blood sugar, blood pressure and cholesterol control. Deteriorated refer diabetic ed     Latest Ref Rng & Units 02/20/2022    8:25 AM 09/12/2021    8:09 AM 04/28/2021    9:20 AM 12/04/2020    8:06 AM 07/29/2020    9:37 AM  Diabetic Labs  HbA1c 4.8 - 5.6 % 6.6  6.4  6.4  6.2  6.4   Chol 100 - 199 mg/dL 171   185  209    HDL >39 mg/dL 67   73  68    Calc LDL 0 - 99 mg/dL 93   104  131    Triglycerides 0 - 149 mg/dL 58   41  57    Creatinine 0.57 - 1.00 mg/dL 0.76  0.70  0.68  0.75  0.74       02/24/2022    8:33 AM 11/03/2021    3:57 PM 09/17/2021    4:00 PM 05/07/2021    4:22 PM 03/17/2021    3:07 PM 02/11/2021    4:10 PM 02/11/2021    3:51 PM  BP/Weight  Systolic BP 962 952 841 [phone] 253  Diastolic BP 75 75 77 74 74 72 78  Wt. (Lbs) 191.04 192 194 195 192.2  194  BMI 29.92 kg/m2 30.07 kg/m2 30.38 kg/m2 29.65 kg/m2 30.1 kg/m2  30.38 kg/m2      05/07/2021    4:00 PM 05/09/2020    8:00 AM  Foot/eye exam completion dates  Foot Form Completion Done Done

## 2022-02-24 NOTE — Patient Instructions (Addendum)
Welcome to Medicare in 6 to 8 weeks  Microalb today  You are referred for diabetic ed ( nurse pls do this and provide pt with basic nutrition education for diabetes)  Please call your insurance to see if they have silver sneakers,this will get you to the [facility] at no cost, commitment to exercise 30  mins , 5 days per week is important for health  Happy birthday in next 2 weeks!  You are referred to Dr Aline Brochure re left trigger thumb  Think about what you will eat, plan ahead. Choose " clean, green, fresh or frozen" over canned, processed or packaged foods which are more sugary, salty and fatty. 70 to 75% of food eaten should be vegetables and fruit. Three meals at set times with snacks allowed between meals, but they must be fruit or vegetables. Aim to eat over a 12 hour period , example 7 am to 7 pm, and STOP after  your last meal of the day. Drink water,generally about 64 ounces per day, no other drink is as healthy. Fruit juice is best enjoyed in a healthy way, by EATING the fruit. Thanks for choosing [facility], we consider it a privelige to serve you.

## 2022-02-24 NOTE — Assessment & Plan Note (Signed)
  Patient re-educated about  the importance of commitment to a  minimum of 150 minutes of exercise per week as able.  The importance of healthy food choices with portion control discussed, as well as eating regularly and within a 12 hour window most days. The need to choose "clean , green" food 50 to 75% of the time is discussed, as well as to make water the primary drink and set a goal of 64 ounces water daily.       02/24/2022    8:33 AM 11/03/2021    3:57 PM 09/17/2021    4:00 PM  Weight /BMI  Weight 191 lb 0.6 oz 192 lb 194 lb  Height 5\' 7"  (1.702 m) 5\' 7"  (1.702 m) 5\' 7"  (1.702 m)  BMI 29.92 kg/m2 30.07 kg/m2 30.38 kg/m2    Unchnaged

## 2022-02-24 NOTE — Assessment & Plan Note (Signed)
Hyperlipidemia:Low fat diet discussed and encouraged.   Lipid Panel  Lab Results  Component Value Date   CHOL 171 02/20/2022   HDL 67 02/20/2022   LDLCALC 93 02/20/2022   TRIG 58 02/20/2022   CHOLHDL 2.6 02/20/2022     Controlled, no change in medication

## 2022-02-26 ENCOUNTER — Telehealth: Payer: Self-pay | Admitting: Family Medicine

## 2022-02-26 NOTE — Telephone Encounter (Signed)
Jasmine called from [facility] left voicemail asking did office receive a fax, if so please fax over a form needs to be filled out and faxed back, if you did not receive the form call [phone].

## 2022-02-27 ENCOUNTER — Other Ambulatory Visit: Payer: Self-pay

## 2022-02-27 ENCOUNTER — Telehealth: Payer: Self-pay | Admitting: Family Medicine

## 2022-02-27 LAB — MICROALBUMIN / CREATININE URINE RATIO
Creatinine, Urine: 306.4 mg/dL
Microalb/Creat Ratio: 6 mg/g creat (ref 0–29)
Microalbumin, Urine: 17 ug/mL

## 2022-02-27 MED ORDER — AMLODIPINE BESYLATE 5 MG PO TABS: 5.0000 mg | ORAL_TABLET | Freq: Every day | ORAL | 0 refills | Status: DC

## 2022-02-27 MED ORDER — HYDROCHLOROTHIAZIDE 25 MG PO TABS: 25.0000 mg | ORAL_TABLET | Freq: Every day | ORAL | 0 refills | Status: DC

## 2022-02-27 NOTE — Telephone Encounter (Signed)
Refills sent

## 2022-02-27 NOTE — Telephone Encounter (Signed)
The patient needs to verify that she asked for supplies from this company

## 2022-02-27 NOTE — Telephone Encounter (Signed)
Prescription Request  02/27/2022  Is this a "Controlled Substance" medicine? No  LOV: 02/24/2022  What is the name of the medication or equipment? hydrochlorothiazide (HYDRODIURIL) 25 MG tablet [id number]    amLODipine (NORVASC) 5 MG tablet [id number]   Have you contacted your pharmacy to request a refill? No   Which pharmacy would you like this sent to?  [facility]   Patient notified that their request is being sent to the clinical staff for review and that they should receive a response within 2 business days.   Please advise at Mobile [phone] (mobile)

## 2022-03-05 ENCOUNTER — Telehealth: Payer: Self-pay | Admitting: Family Medicine

## 2022-03-05 NOTE — Telephone Encounter (Signed)
Diabetic supply forms    Noted  Copied Sleeved   Original in provider box Copy ion brown folder

## 2022-03-09 ENCOUNTER — Encounter: Payer: Self-pay | Admitting: Orthopedic Surgery

## 2022-03-09 ENCOUNTER — Ambulatory Visit: Payer: Medicare Other | Admitting: Orthopedic Surgery

## 2022-03-09 DIAGNOSIS — M65312 Trigger thumb, left thumb: Secondary | ICD-10-CM

## 2022-03-09 MED ORDER — METHYLPREDNISOLONE ACETATE 40 MG/ML IJ SUSP
40.0000 mg | Freq: Once | INTRAMUSCULAR | Status: AC
  Administered 2022-03-09: 40 mg via INTRA_ARTICULAR

## 2022-03-09 NOTE — Progress Notes (Unsigned)
Chief Complaint  Patient presents with   Hand Problem    Left thumb triggering again    Megan Villa comes in with a current triggering of the left thumb last injection was October 23 she got good results  Today the left thumb is tender over the A1 pulley with swelling she demonstrates triggering and locking  She opted for injection versus surgical intervention  We injected the left thumb  Left Trigger thumb injection Medication  1 mL of 40 mg Depo-Medrol  2 mL of 1% lidocaine plain  Ethyl chloride for anesthesia  Verbal consent was obtained timeout was taken to confirm the injection site as left thumb  Alcohol was used to prepare the skin along with ethyl chloride and then the injection was made at the A1 pulley there were no complications

## 2022-03-10 NOTE — Telephone Encounter (Signed)
Called patient will pick up a form and fax over [phone].

## 2022-03-17 DIAGNOSIS — E119 Type 2 diabetes mellitus without complications: Secondary | ICD-10-CM | POA: Diagnosis not present

## 2022-03-19 ENCOUNTER — Encounter: Payer: Self-pay | Admitting: Radiology

## 2022-03-20 ENCOUNTER — Other Ambulatory Visit: Payer: Self-pay | Admitting: Family Medicine

## 2022-03-23 ENCOUNTER — Encounter: Payer: Self-pay | Admitting: Nutrition

## 2022-03-23 ENCOUNTER — Encounter: Payer: Medicare Other | Attending: Family Medicine | Admitting: Nutrition

## 2022-03-23 VITALS — Ht 67.0 in | Wt 189.0 lb

## 2022-03-23 DIAGNOSIS — Z713 Dietary counseling and surveillance: Secondary | ICD-10-CM | POA: Diagnosis not present

## 2022-03-23 DIAGNOSIS — E785 Hyperlipidemia, unspecified: Secondary | ICD-10-CM

## 2022-03-23 DIAGNOSIS — E1159 Type 2 diabetes mellitus with other circulatory complications: Secondary | ICD-10-CM | POA: Diagnosis not present

## 2022-03-23 DIAGNOSIS — I10 Essential (primary) hypertension: Secondary | ICD-10-CM

## 2022-03-23 NOTE — Patient Instructions (Addendum)
Goals  Exercise by walking for 30 minutes  3 times per week. Eat meals 3 times per day; B) 6-8, L) 12-2 D) 5-7 Cut out sweets and processed foods Test blood sugars twice a day Get A1C 5.7%  or below

## 2022-03-23 NOTE — Progress Notes (Signed)
Medical Nutrition Therapy  Appointment Start time:  56  Appointment End time:  1400  Primary concerns today: DM  Referral diagnosis: e11.8 Preferred learning style: No preferences Learning readiness: Ready    NUTRITION ASSESSMENT  67 yr old bfmale here for new diagnosis of Type 2 DM. She sees Dr. Moshe Cipro for her PCP. A1C 6.6% She notes she use to eats sweets and some processed foods. Since seeing Dr. Moshe Cipro, she has  has cut back on white rice, sweets, processedf carbs in general. She is trying to eat healthier foods. Cooks most foods at home. Not doing any physical activity right now but willing to start walking. PMH; HTN, Hyperlipidemia, overweight. BMI 29. Testing BS in am. BS usually 110-120's depending on what she is eating after supper.  She is willing to work on lifestyle medicine to improve her heatlh, reverse her DM, HTN and Hyperlipidemia.   Anthropometrics  Wt Readings from Last 3 Encounters:  02/24/22 191 lb 0.6 oz (86.7 kg)  11/03/21 192 lb (87.1 kg)  09/17/21 194 lb (88 kg)   Ht Readings from Last 3 Encounters:  02/24/22 '5\' 7"'$  (1.702 m)  11/03/21 '5\' 7"'$  (1.702 m)  09/17/21 '5\' 7"'$  (1.702 m)   There is no height or weight on file to calculate BMI. '@BMIFA'$ @ Facility age limit for growth %iles is 20 years. Facility age limit for growth %iles is 20 years.    Clinical Medical Hx: DM , HTN, HYperlipdiemia Medications: see chart Labs:  Lab Results  Component Value Date   HGBA1C 6.6 (H) 02/20/2022      Latest Ref Rng & Units 02/20/2022    8:25 AM 09/12/2021    8:09 AM 04/28/2021    9:20 AM  CMP  Glucose 70 - 99 mg/dL 120  115  108   BUN 8 - 27 mg/dL '13  14  13   '$ Creatinine 0.57 - 1.00 mg/dL 0.76  0.70  0.68   Sodium 134 - 144 mmol/L 141  142  141   Potassium 3.5 - 5.2 mmol/L 4.1  4.2  4.0   Chloride 96 - 106 mmol/L 103  103  103   CO2 20 - 29 mmol/L '24  23  26   '$ Calcium 8.7 - 10.3 mg/dL 9.4  9.3  9.6   Total Protein 6.0 - 8.5 g/dL 6.9   6.4   Total  Bilirubin 0.0 - 1.2 mg/dL 0.3   0.3   Alkaline Phos 44 - 121 IU/L 93   102   AST 0 - 40 IU/L 14   24   ALT 0 - 32 IU/L 9   18    Lipid Panel     Component Value Date/Time   CHOL 171 02/20/2022 0825   TRIG 58 02/20/2022 0825   HDL 67 02/20/2022 0825   CHOLHDL 2.6 02/20/2022 0825   CHOLHDL 2.8 10/13/2019 0709   VLDL 9 04/17/2016 0706   LDLCALC 93 02/20/2022 0825   LDLCALC 111 (H) 10/13/2019 0709   LABVLDL 11 02/20/2022 0825    Notable Signs/Symptoms: NOne   Lifestyle & Dietary Hx LIve with  her husband.  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  Eats most of foods at h ome.  Estimated daily fluid intake: 90 oz Supplements: None Sleep: good Stress / self-care:  Current average weekly physical activity: walks some  24-Hr Dietary Recall Eats 2-3 meals per day. Usually skips breakfast or lunch. Drinks water.  Estimated Energy Needs Calories: 1200-1500 Carbohydrate: 135g Protein: 90g Fat: 33g  NUTRITION DIAGNOSIS  NB-1.1 Food and nutrition-related knowledge deficit As related to excessive carbohydrate intake.  As evidenced by A1C 6.6%.   NUTRITION INTERVENTION  Nutrition education (E-1) on the following topics:  Nutrition and Diabetes education provided on My Plate, CHO counting, meal planning, portion sizes, timing of meals, avoiding snacks between meals unless having a low blood sugar, target ranges for A1C and blood sugars, signs/symptoms and treatment of hyper/hypoglycemia, monitoring blood sugars, taking medications as prescribed, benefits of exercising 30 minutes per day and prevention of complications of DM.  Lifestyle Medicine  - Whole Food, Plant Predominant Nutrition is highly recommended: Eat Plenty of vegetables, Mushrooms, fruits, Legumes, Whole Grains, Nuts, seeds in lieu of processed meats, processed snacks/pastries red meat, poultry, eggs.    -It is better to avoid simple carbohydrates including: Cakes, Sweet Desserts, Ice Cream, Soda (diet and regular), Sweet Tea,  Candies, Chips, Cookies, Store Bought Juices, Alcohol in Excess of  1-2 drinks a day, Lemonade,  Artificial Sweeteners, Doughnuts, Coffee Creamers, "Sugar-free" Products, etc, etc.  This is not a complete list.....  Exercise: If you are able: 30 -60 minutes a day ,4 days a week, or 150 minutes a week.  The longer the better.  Combine stretch, strength, and aerobic activities.  If you were told in the past that you have high risk for cardiovascular diseases, you may seek evaluation by your heart doctor prior to initiating moderate to intense exercise programs.   Handouts Provided Include  Lifestyle Medicine DM Handouts  Learning Style & Readiness for Change Teaching method utilized: Visual & Auditory  Demonstrated degree of understanding via: Teach Back  Barriers to learning/adherence to lifestyle change: none  Goals Established by Pt Goals  Exercise by walking for 30 minutes  3 times per week. Eat meals 3 times per day; B) 6-8, L) 12-2 D) 5-7 Cut out sweets and processed foods Test blood sugars twice a day Get A1C 5.7%  or below   MONITORING & EVALUATION Dietary intake, weekly physical activity, and blood sugars in  2 months.  Next Steps  Patient is to work on meal planning and exercise.Marland Kitchen

## 2022-04-09 ENCOUNTER — Ambulatory Visit (INDEPENDENT_AMBULATORY_CARE_PROVIDER_SITE_OTHER): Payer: Medicare Other | Admitting: Family Medicine

## 2022-04-09 ENCOUNTER — Encounter: Payer: Self-pay | Admitting: Family Medicine

## 2022-04-09 VITALS — BP 132/73 | HR 72 | Ht 67.0 in | Wt 186.1 lb

## 2022-04-09 DIAGNOSIS — Z780 Asymptomatic menopausal state: Secondary | ICD-10-CM

## 2022-04-09 DIAGNOSIS — Z23 Encounter for immunization: Secondary | ICD-10-CM | POA: Diagnosis not present

## 2022-04-09 DIAGNOSIS — I10 Essential (primary) hypertension: Secondary | ICD-10-CM

## 2022-04-09 DIAGNOSIS — Z0000 Encounter for general adult medical examination without abnormal findings: Secondary | ICD-10-CM | POA: Diagnosis not present

## 2022-04-09 DIAGNOSIS — E1159 Type 2 diabetes mellitus with other circulatory complications: Secondary | ICD-10-CM

## 2022-04-09 DIAGNOSIS — E785 Hyperlipidemia, unspecified: Secondary | ICD-10-CM

## 2022-04-09 DIAGNOSIS — E559 Vitamin D deficiency, unspecified: Secondary | ICD-10-CM

## 2022-04-09 NOTE — Assessment & Plan Note (Signed)
Welcome to [facility]  exam as documented. Counseling done  re healthy lifestyle involving commitment to 150 minutes exercise per week, heart healthy diet, and attaining healthy weight.The importance of adequate sleep also discussed. Regular seat belt use and home safety, is also discussed. Changes in health habits are decided on by the patient with goals and time frames  set for achieving them. Immunization and cancer screening needs are specifically addressed at this visit.

## 2022-04-09 NOTE — Assessment & Plan Note (Signed)
After obtaining informed consent, the vaccine is  administered , with no adverse effect noted at the time of administration.  

## 2022-04-09 NOTE — Patient Instructions (Addendum)
FU 2nd week in May , call if you need me sooner  EKG is NORMAL  Please commit to calcium 1000 mg daiilty  Asprin coated 81 mg daily  Pneumonia vaccine 20 today with tylenol one tablet  Non fasting hBA1C, chem 7 and EGFR, cBC, tSH andVit D May 4 or after  Please schedule bone density at checkout  Please review info on Advanced directions  It is important that you exercise regularly at least 30 minutes 5 times a week. If you develop chest pain, have severe difficulty breathing, or feel very tired, stop exercising immediately and seek medical attention   Thanks for choosing [facility], we consider it a privelige to serve you.

## 2022-04-09 NOTE — Progress Notes (Signed)
Preventive Screening-Counseling & Management   Patient present here today for a welcome to medicare exam  Current Problems (verified)   Medications Prior to Visit Allergies (verified)   PAST HISTORY  Family History:Only sister dies at age 65 , she was diabetic, Mother was diabetic , she was age 68, dead at age 35  Social History Married, mother of one son, no alcohol, street drug, or nicotine use   Risk Factors  Current exercise habits:  plans to start exercise  Dietary issues discussed:plant based eating, stop eating after 6 pm , no snacks, cut out sugar, cut back on starches   Cardiac risk factors: hypertension, hyperlipidemia, diabetes  Depression Screen  (Note: if answer to either of the following is "Yes", a more complete depression screening is indicated)   Over the past two weeks, have you felt down, depressed or hopeless? No  Over the past two weeks, have you felt little interest or pleasure in doing things? No  Have you lost interest or pleasure in daily life? No  Do you often feel hopeless? No  Do you cry easily over simple problems? No   Activities of Daily Living  In your present state of health, do you have any difficulty performing the following activities?  Driving?: No Managing money?: No Feeding yourself?:No Getting from bed to chair?:No Climbing a flight of stairs?:No Preparing food and eating?:No Bathing or showering?:No Getting dressed?:No Getting to the toilet?:No Using the toilet?:No Moving around from place to place?: No  Fall Risk Assessment In the past year have you fallen or had a near fall?:fell once last year on the job, tripped  on something on the floor Are you currently taking any medications that make you dizzy?:No   Hearing Difficulties: No Do you often ask people to speak up or repeat themselves?:No Do you experience ringing or noises in your ears?:No Do you have difficulty understanding soft or whispered voices?:No  Cognitive  Testing  Alert? Yes Normal Appearance?Yes  Oriented to person? Yes Place? Yes  Time? Yes  Displays appropriate judgment?Yes  Can read the correct time from a watch face? yes Are you having problems remembering things?No  Advanced Directives have been discussed with the patient?Yes    List the Names of Other Physician/Practitioners you currently use:    Indicate any recent Medical Services you may have received from other than [facility] providers in the past year (date may be approximate).     Medicare Attestation  I have personally reviewed:  The patient's medical and social history  Their use of alcohol, tobacco or illicit drugs  Their current medications and supplements  The patient's functional ability including ADLs,fall risks, home safety risks, cognitive, and hearing and visual impairment  Diet and physical activities  Evidence for depression or mood disorders  The patient's weight, height, BMI, and visual acuity have been recorded in the chart. I have made referrals, counseling, and provided education to the patient based on review of the above and I have provided the patient with a written personalized care plan for preventive services.    Physical Exam BP 132/73 (BP Location: Right Arm, Patient Position: Sitting, Cuff Size: Large)   Pulse 72   Ht 5\' 7"  (1.702 m)   Wt 186 lb 1.9 oz (84.4 kg)   SpO2 97%   BMI 29.15 kg/m   EKG: NSR, no LVH, no ischemia Assessment & Plan:  Welcome to Medicare preventive visit Welcome to [facility]  exam as documented. Counseling done  re healthy lifestyle  involving commitment to 150 minutes exercise per week, heart healthy diet, and attaining healthy weight.The importance of adequate sleep also discussed. Regular seat belt use and home safety, is also discussed. Changes in health habits are decided on by the patient with goals and time frames  set for achieving them. Immunization and cancer screening needs are specifically addressed at this  visit.   Need for pneumococcal 20-valent conjugate vaccination After obtaining informed consent, the vaccine is  administered , with no adverse effect noted at the time of administration.

## 2022-04-14 ENCOUNTER — Ambulatory Visit (HOSPITAL_COMMUNITY)
Admission: RE | Admit: 2022-04-14 | Discharge: 2022-04-14 | Disposition: A | Discharge status: Home / Self Care | Payer: Medicare Other | Source: Ambulatory Visit | Attending: Family Medicine | Admitting: Family Medicine

## 2022-04-14 DIAGNOSIS — Z780 Asymptomatic menopausal state: Secondary | ICD-10-CM | POA: Insufficient documentation

## 2022-05-04 ENCOUNTER — Telehealth: Payer: Medicare Other | Admitting: Family Medicine

## 2022-05-05 ENCOUNTER — Telehealth: Payer: Self-pay | Admitting: Family Medicine

## 2022-05-05 NOTE — Telephone Encounter (Signed)
Her testing supplies directions say once daily. She is not on insulin so more than once daily will not be covered

## 2022-05-05 NOTE — Telephone Encounter (Signed)
Patient came by office needs for nures to giver her a call about lancets and meter not sure to do 1x a day or bid.  She was told by Dr Calvert Cantor to ask Dr Lodema Hong. Call patient [phone]

## 2022-05-05 NOTE — Telephone Encounter (Signed)
Replied to pt via active mychart

## 2022-05-25 ENCOUNTER — Ambulatory Visit (INDEPENDENT_AMBULATORY_CARE_PROVIDER_SITE_OTHER): Payer: Medicare Other

## 2022-05-25 DIAGNOSIS — Z111 Encounter for screening for respiratory tuberculosis: Secondary | ICD-10-CM | POA: Diagnosis not present

## 2022-05-25 DIAGNOSIS — I10 Essential (primary) hypertension: Secondary | ICD-10-CM | POA: Diagnosis not present

## 2022-05-25 DIAGNOSIS — E559 Vitamin D deficiency, unspecified: Secondary | ICD-10-CM | POA: Diagnosis not present

## 2022-05-25 DIAGNOSIS — E1159 Type 2 diabetes mellitus with other circulatory complications: Secondary | ICD-10-CM | POA: Diagnosis not present

## 2022-05-26 LAB — CBC
Hematocrit: 39.4 % (ref 34.0–46.6)
Hemoglobin: 12.4 g/dL (ref 11.1–15.9)
MCH: 26.6 pg (ref 26.6–33.0)
MCHC: 31.5 g/dL (ref 31.5–35.7)
MCV: 85 fL (ref 79–97)
Platelets: 322 10*3/uL (ref 150–450)
RBC: 4.66 x10E6/uL (ref 3.77–5.28)
RDW: 14.1 % (ref 11.7–15.4)
WBC: 5 10*3/uL (ref 3.4–10.8)

## 2022-05-26 LAB — BMP8+EGFR
BUN/Creatinine Ratio: 17 (ref 12–28)
BUN: 12 mg/dL (ref 8–27)
CO2: 24 mmol/L (ref 20–29)
Calcium: 9.4 mg/dL (ref 8.7–10.3)
Chloride: 102 mmol/L (ref 96–106)
Creatinine, Ser: 0.72 mg/dL (ref 0.57–1.00)
Glucose: 117 mg/dL — ABNORMAL HIGH (ref 70–99)
Potassium: 3.8 mmol/L (ref 3.5–5.2)
Sodium: 141 mmol/L (ref 134–144)
eGFR: 93 mL/min/{1.73_m2} (ref >59)

## 2022-05-26 LAB — VITAMIN D 25 HYDROXY (VIT D DEFICIENCY, FRACTURES): Vit D, 25-Hydroxy: 51.5 ng/mL (ref 30.0–100.0)

## 2022-05-26 LAB — HEMOGLOBIN A1C
Est. average glucose Bld gHb Est-mCnc: 143 mg/dL
Hgb A1c MFr Bld: 6.6 % — ABNORMAL HIGH (ref 4.8–5.6)

## 2022-05-26 LAB — TSH: TSH: 3.86 u[IU]/mL (ref 0.450–4.500)

## 2022-05-28 ENCOUNTER — Other Ambulatory Visit: Payer: Self-pay

## 2022-05-28 ENCOUNTER — Encounter: Payer: Self-pay | Admitting: Family Medicine

## 2022-05-28 ENCOUNTER — Ambulatory Visit: Payer: Medicare Other | Admitting: Nutrition

## 2022-05-28 ENCOUNTER — Ambulatory Visit (INDEPENDENT_AMBULATORY_CARE_PROVIDER_SITE_OTHER): Payer: Medicare Other | Admitting: Family Medicine

## 2022-05-28 VITALS — BP 133/77 | HR 87 | Ht 67.0 in | Wt 183.1 lb

## 2022-05-28 DIAGNOSIS — Z1231 Encounter for screening mammogram for malignant neoplasm of breast: Secondary | ICD-10-CM

## 2022-05-28 DIAGNOSIS — E1159 Type 2 diabetes mellitus with other circulatory complications: Secondary | ICD-10-CM | POA: Diagnosis not present

## 2022-05-28 DIAGNOSIS — I10 Essential (primary) hypertension: Secondary | ICD-10-CM | POA: Diagnosis not present

## 2022-05-28 DIAGNOSIS — E785 Hyperlipidemia, unspecified: Secondary | ICD-10-CM

## 2022-05-28 LAB — TB SKIN TEST
Induration: 0 mm
TB Skin Test: NEGATIVE

## 2022-05-28 MED ORDER — POTASSIUM CHLORIDE CRYS ER 20 MEQ PO TBCR: 20.0000 meq | EXTENDED_RELEASE_TABLET | Freq: Every day | ORAL | 1 refills | Status: DC

## 2022-05-28 MED ORDER — HYDROCHLOROTHIAZIDE 25 MG PO TABS: 25.0000 mg | ORAL_TABLET | Freq: Every day | ORAL | 1 refills | Status: DC

## 2022-05-28 MED ORDER — CONTOUR NEXT TEST VI STRP: ORAL_STRIP | 2 refills | Status: DC

## 2022-05-28 MED ORDER — MICROLET LANCETS MISC: 2 refills | Status: DC

## 2022-05-28 MED ORDER — ROSUVASTATIN CALCIUM 10 MG PO TABS: 10.0000 mg | ORAL_TABLET | Freq: Every day | ORAL | 5 refills | Status: DC

## 2022-05-28 MED ORDER — AMLODIPINE BESYLATE 5 MG PO TABS: 5.0000 mg | ORAL_TABLET | Freq: Every day | ORAL | 1 refills | Status: DC

## 2022-05-28 NOTE — Patient Instructions (Addendum)
Annual exam with foot exam in August, call if you need me sooner  Nurse please perform and document vision screen  Fasting lipid, cmp and EGFr, hBA1C 3 to 5 days before next appointment  Please schedule mammogram art checkout  You will be referred for colonoscopy due in the Fall  I recommend covid booster  Please get printout of your immunization record from your pharmacy to keep with you for your record   Thanks for choosing [facility], we consider it a privelige to serve you.

## 2022-06-01 NOTE — Assessment & Plan Note (Signed)
Megan Villa is reminded of the importance of commitment to daily physical activity for 30 minutes or more, as able and the need to limit carbohydrate intake to 30 to 60 grams per meal to help with blood sugar control.   Unchanged, challenged to improve control  Updated lab needed at/ before next visit. .   Megan Villa is reminded of the importance of daily foot exam, annual eye examination, and good blood sugar, blood pressure and cholesterol control.     Latest Ref Rng & Units 05/25/2022    8:54 AM 02/24/2022    9:43 AM 02/20/2022    8:25 AM 09/12/2021    8:09 AM 04/28/2021    9:20 AM  Diabetic Labs  HbA1c 4.8 - 5.6 % 6.6   6.6  6.4  6.4   Micro/Creat Ratio 0 - 29 mg/g creat  6      Chol 100 - 199 mg/dL   161   096   HDL >04 mg/dL   67   73   Calc LDL 0 - 99 mg/dL   93   540   Triglycerides 0 - 149 mg/dL   58   41   Creatinine 0.57 - 1.00 mg/dL 9.81   1.91  4.78  2.95       05/28/2022    1:05 PM 04/09/2022    1:07 PM 03/23/2022    1:09 PM 02/24/2022    8:33 AM 11/03/2021    3:57 PM 09/17/2021    4:00 PM 05/07/2021    4:22 PM  BP/Weight  Systolic BP 133 132  131 128 129 123  Diastolic BP 77 73  75 75 77 74  Wt. (Lbs) 183.08 186.12 189 191.04 192 194 195  BMI 28.67 kg/m2 29.15 kg/m2 29.6 kg/m2 29.92 kg/m2 30.07 kg/m2 30.38 kg/m2 29.65 kg/m2      05/07/2021    4:00 PM 05/09/2020    8:00 AM  Foot/eye exam completion dates  Foot Form Completion Done Done

## 2022-06-01 NOTE — Progress Notes (Signed)
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  Preventive health is updated, specifically  Cancer screening and Immunization.    The PT denies any adverse reactions to current medications since the last visit.   ROS Denies recent fever or chills. Denies sinus pressure, nasal congestion, ear pain or sore throat. Denies chest congestion, productive cough or wheezing. Denies chest pains, palpitations and leg swelling Denies abdominal pain, nausea, vomiting,diarrhea or constipation.   Denies dysuria, frequency, hesitancy or incontinence. Denies joint pain, swelling and limitation in mobility. Denies headaches, seizures, numbness, or tingling. Denies depression, anxiety or insomnia. Denies skin break down or rash.   PE  BP 133/77 (BP Location: Right Arm, Patient Position: Sitting, Cuff Size: Large)   Pulse 87   Ht 5\' 7"  (1.702 m)   Wt 183 lb 1.3 oz (83 kg)   SpO2 96%   BMI 28.67 kg/m   Patient alert and oriented and in no cardiopulmonary distress.  HEENT: No facial asymmetry, EOMI,     Neck supple .  Chest: Clear to auscultation bilaterally.  CVS: S1, S2 no murmurs, no S3.Regular rate.  ABD: Soft non tender.   Ext: No edema  MS: Adequate ROM spine, shoulders, hips and knees.  Skin: Intact, no ulcerations or rash noted.  Psych: Good eye contact, normal affect. Memory intact not anxious or depressed appearing.  CNS: CN 2-12 intact, power,  normal throughout.no focal deficits noted.   Assessment & Plan  Essential hypertension Controlled, no change in medication DASH diet and commitment to daily physical activity for a minimum of 30  minutes discussed and encouraged, as a part of hypertension management. The importance of attaining a healthy weight is also discussed.     05/28/2022    1:05 PM 04/09/2022    1:07 PM 03/23/2022    1:09 PM 02/24/2022    8:33 AM 11/03/2021    3:57 PM 09/17/2021    4:00 PM 05/07/2021    4:22 PM  BP/Weight  Systolic BP 133 132  131 128 129 123  Diastolic BP 77 73  75 75 77 74  Wt. (Lbs) 183.08 186.12 189 191.04 192 194 195  BMI 28.67 kg/m2 29.15 kg/m2 29.6 kg/m2 29.92 kg/m2 30.07 kg/m2 30.38 kg/m2 29.65 kg/m2       Hyperlipidemia LDL goal <100 Hyperlipidemia:Low fat diet discussed and encouraged.   Lipid Panel  Lab Results  Component Value Date   CHOL 171 02/20/2022   HDL 67 02/20/2022   LDLCALC 93 02/20/2022   TRIG 58 02/20/2022   CHOLHDL 2.6 02/20/2022     Controlled, no change in medication   Type 2 diabetes mellitus with vascular disease (HCC) [name] is reminded of the importance of commitment to daily physical activity for 30 minutes or more, as able and the need to limit carbohydrate intake to 30 to 60 grams per meal to help with blood sugar control.   Unchanged, challenged to improve control  Updated lab needed at/ before next visit. .   [name] is reminded of the importance of daily foot exam, annual eye examination, and good blood sugar, blood pressure  and cholesterol control.     Latest Ref Rng & Units 05/25/2022    8:54 AM 02/24/2022    9:43 AM 02/20/2022    8:25 AM 09/12/2021    8:09 AM 04/28/2021    9:20 AM  Diabetic Labs  HbA1c 4.8 - 5.6 % 6.6   6.6  6.4  6.4   Micro/Creat Ratio 0 - 29 mg/g creat  6      Chol 100 - 199 mg/dL   161   096   HDL >04 mg/dL   67   73   Calc LDL 0 - 99 mg/dL   93   540   Triglycerides 0 - 149 mg/dL   58   41   Creatinine 0.57 - 1.00 mg/dL 9.81   1.91  4.78  2.95       05/28/2022    1:05 PM 04/09/2022    1:07 PM 03/23/2022    1:09 PM 02/24/2022    8:33 AM 11/03/2021    3:57 PM 09/17/2021    4:00 PM 05/07/2021    4:22 PM   BP/Weight  Systolic BP 133 132  131 128 129 123  Diastolic BP 77 73  75 75 77 74  Wt. (Lbs) 183.08 186.12 189 191.04 192 194 195  BMI 28.67 kg/m2 29.15 kg/m2 29.6 kg/m2 29.92 kg/m2 30.07 kg/m2 30.38 kg/m2 29.65 kg/m2      05/07/2021    4:00 PM 05/09/2020    8:00 AM  Foot/eye exam completion dates  Foot Form Completion Done Done

## 2022-06-01 NOTE — Assessment & Plan Note (Signed)
Hyperlipidemia:Low fat diet discussed and encouraged.   Lipid Panel  Lab Results  Component Value Date   CHOL 171 02/20/2022   HDL 67 02/20/2022   LDLCALC 93 02/20/2022   TRIG 58 02/20/2022   CHOLHDL 2.6 02/20/2022     Controlled, no change in medication  

## 2022-06-01 NOTE — Assessment & Plan Note (Signed)
Controlled, no change in medication DASH diet and commitment to daily physical activity for a minimum of 30 minutes discussed and encouraged, as a part of hypertension management. The importance of attaining a healthy weight is also discussed.     05/28/2022    1:05 PM 04/09/2022    1:07 PM 03/23/2022    1:09 PM 02/24/2022    8:33 AM 11/03/2021    3:57 PM 09/17/2021    4:00 PM 05/07/2021    4:22 PM  BP/Weight  Systolic BP 133 132  131 128 129 123  Diastolic BP 77 73  75 75 77 74  Wt. (Lbs) 183.08 186.12 189 191.04 192 194 195  BMI 28.67 kg/m2 29.15 kg/m2 29.6 kg/m2 29.92 kg/m2 30.07 kg/m2 30.38 kg/m2 29.65 kg/m2

## 2022-06-02 ENCOUNTER — Telehealth: Payer: Self-pay | Admitting: Family Medicine

## 2022-06-02 ENCOUNTER — Other Ambulatory Visit: Payer: Self-pay

## 2022-06-02 MED ORDER — BLOOD GLUCOSE MONITORING SUPPL DEVI: 1.0000 | Freq: Three times a day (TID) | 0 refills | Status: AC

## 2022-06-02 MED ORDER — LANCETS MISC. MISC: 1.0000 | Freq: Three times a day (TID) | 0 refills | Status: AC

## 2022-06-02 MED ORDER — BLOOD GLUCOSE TEST VI STRP: 1.0000 | ORAL_STRIP | Freq: Three times a day (TID) | 0 refills | Status: DC

## 2022-06-02 MED ORDER — LANCET DEVICE MISC: 1.0000 | Freq: Three times a day (TID) | 0 refills | Status: DC

## 2022-06-02 MED ORDER — BLOOD GLUCOSE TEST VI STRP: 1.0000 | ORAL_STRIP | Freq: Three times a day (TID) | 0 refills | Status: AC

## 2022-06-02 MED ORDER — LANCET DEVICE MISC: 1.0000 | Freq: Three times a day (TID) | 0 refills | Status: AC

## 2022-06-02 NOTE — Telephone Encounter (Signed)
Rx sent for accu check meter.

## 2022-06-02 NOTE — Telephone Encounter (Signed)
Marchelle Folks called from [facility] needs a new script.  Patient insurance is requiring acu chek guide, needs a new meter and supplies sent to [employer]

## 2022-06-04 ENCOUNTER — Encounter: Payer: Medicare Other | Attending: Family Medicine | Admitting: Nutrition

## 2022-06-04 ENCOUNTER — Encounter: Payer: Self-pay | Admitting: Nutrition

## 2022-06-04 VITALS — Ht 68.0 in | Wt 185.0 lb

## 2022-06-04 DIAGNOSIS — I10 Essential (primary) hypertension: Secondary | ICD-10-CM | POA: Diagnosis not present

## 2022-06-04 DIAGNOSIS — E1159 Type 2 diabetes mellitus with other circulatory complications: Secondary | ICD-10-CM | POA: Insufficient documentation

## 2022-06-04 DIAGNOSIS — E785 Hyperlipidemia, unspecified: Secondary | ICD-10-CM | POA: Insufficient documentation

## 2022-06-04 NOTE — Patient Instructions (Addendum)
Start chair exercises twice a week Go to the [employer] 3 times per week. Avoid processed foods and high fat foods

## 2022-06-04 NOTE — Progress Notes (Signed)
Medical Nutrition Therapy  Appointment Start time:  1130 Appointment End time:  1200 Primary concerns today: DM  Referral diagnosis: e11.8 Preferred learning style: No preferences Learning readiness: Ready    NUTRITION ASSESSMENT  DM Follow up A1C 6.6%, same as before. She note she is trying to be more mindful about what she is eating. Hasn't started exercising yet. Willing to get back to senior center or walking. Started eating [employer] One cereal for breakfast and it keeps her full for a long time. Sometimes doesn't get hungry for lunch.  She is willing to work on lifestyle medicine to improve her heatlh, reverse her DM, HTN and Hyperlipidemia.  Goals set previously  Exercise by walking for 30 minutes  3 times per week.- working on it Eat meals 3 times per day; B) 6-8, L) 12-2 D) 5-7-done better Cut out sweets and processed foods-have improved. Test blood sugars twice a day-doing  Get A1C 5.7%  or below-still working on it. Anthropometrics  Wt Readings from Last 3 Encounters:  05/28/22 183 lb 1.3 oz (83 kg)  04/09/22 186 lb 1.9 oz (84.4 kg)  03/23/22 189 lb (85.7 kg)   Ht Readings from Last 3 Encounters:  05/28/22 5\' 7"  (1.702 m)  04/09/22 5\' 7"  (1.702 m)  03/23/22 5\' 7"  (1.702 m)   There is no height or weight on file to calculate BMI. @BMIFA @ Facility age limit for growth %iles is 20 years. Facility age limit for growth %iles is 20 years.    Clinical Medical Hx: DM , HTN, HYperlipdiemia Medications: see chart Labs:  Lab Results  Component Value Date   HGBA1C 6.6 (H) 05/25/2022      Latest Ref Rng & Units 05/25/2022    8:54 AM 02/20/2022    8:25 AM 09/12/2021    8:09 AM  CMP  Glucose 70 - 99 mg/dL 161  096  045   BUN 8 - 27 mg/dL 12  13  14    Creatinine 0.57 - 1.00 mg/dL 4.09  8.11  9.14   Sodium 134 - 144 mmol/L 141  141  142   Potassium 3.5 - 5.2 mmol/L 3.8  4.1  4.2   Chloride 96 - 106 mmol/L 102  103  103   CO2 20 - 29 mmol/L 24  24  23    Calcium 8.7 - 10.3  mg/dL 9.4  9.4  9.3   Total Protein 6.0 - 8.5 g/dL  6.9    Total Bilirubin 0.0 - 1.2 mg/dL  0.3    Alkaline Phos 44 - 121 IU/L  93    AST 0 - 40 IU/L  14    ALT 0 - 32 IU/L  9     Lipid Panel     Component Value Date/Time   CHOL 171 02/20/2022 0825   TRIG 58 02/20/2022 0825   HDL 67 02/20/2022 0825   CHOLHDL 2.6 02/20/2022 0825   CHOLHDL 2.8 10/13/2019 0709   VLDL 9 04/17/2016 0706   LDLCALC 93 02/20/2022 0825   LDLCALC 111 (H) 10/13/2019 0709   LABVLDL 11 02/20/2022 0825    Notable Signs/Symptoms: NOne   Lifestyle & Dietary Hx LIve with  her husband.  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  Eats most of foods at h ome.  Estimated daily fluid intake: 90 oz Supplements: None Sleep: good Stress / self-care:  Current average weekly physical activity: walks some  24-Hr Dietary Recall B) Egg, sausage,  or fiber one cereal.  Lunch: has't eaten yet  Dinner:  meat and vegetables, water  Estimated Energy Needs Calories: 1200-1500 Carbohydrate: 135g Protein: 90g Fat: 33g   NUTRITION DIAGNOSIS  NB-1.1 Food and nutrition-related knowledge deficit As related to excessive carbohydrate intake.  As evidenced by A1C 6.6%.   NUTRITION INTERVENTION  Nutrition education (E-1) on the following topics:  Nutrition and Diabetes education provided on My Plate, CHO counting, meal planning, portion sizes, timing of meals, avoiding snacks between meals unless having a low blood sugar, target ranges for A1C and blood sugars, signs/symptoms and treatment of hyper/hypoglycemia, monitoring blood sugars, taking medications as prescribed, benefits of exercising 30 minutes per day and prevention of complications of DM.  Lifestyle Medicine  - Whole Food, Plant Predominant Nutrition is highly recommended: Eat Plenty of vegetables, Mushrooms, fruits, Legumes, Whole Grains, Nuts, seeds in lieu of processed meats, processed snacks/pastries red meat, poultry, eggs.    -It is better to avoid simple carbohydrates  including: Cakes, Sweet Desserts, Ice Cream, Soda (diet and regular), Sweet Tea, Candies, Chips, Cookies, Store Bought Juices, Alcohol in Excess of  1-2 drinks a day, Lemonade,  Artificial Sweeteners, Doughnuts, Coffee Creamers, "Sugar-free" Products, etc, etc.  This is not a complete list.....  Exercise: If you are able: 30 -60 minutes a day ,4 days a week, or 150 minutes a week.  The longer the better.  Combine stretch, strength, and aerobic activities.  If you were told in the past that you have high risk for cardiovascular diseases, you may seek evaluation by your heart doctor prior to initiating moderate to intense exercise programs.   Handouts Provided Include  Lifestyle Medicine DM Handouts  Learning Style & Readiness for Change Teaching method utilized: Visual & Auditory  Demonstrated degree of understanding via: Teach Back  Barriers to learning/adherence to lifestyle change: none  Goals Established by Pt Goals Start chair exercises twice a week Go to the [facility] 3 times per week. Avoid processed foods and high fat foods   MONITORING & EVALUATION Dietary intake, weekly physical activity, and blood sugars in  3 months.  Next Steps  Patient is to work on meal planning and exercise.Marland Kitchen

## 2022-08-04 DIAGNOSIS — E119 Type 2 diabetes mellitus without complications: Secondary | ICD-10-CM | POA: Diagnosis not present

## 2022-08-25 DIAGNOSIS — E1159 Type 2 diabetes mellitus with other circulatory complications: Secondary | ICD-10-CM | POA: Diagnosis not present

## 2022-08-25 DIAGNOSIS — I10 Essential (primary) hypertension: Secondary | ICD-10-CM | POA: Diagnosis not present

## 2022-08-25 DIAGNOSIS — E785 Hyperlipidemia, unspecified: Secondary | ICD-10-CM | POA: Diagnosis not present

## 2022-08-28 ENCOUNTER — Encounter: Payer: Self-pay | Admitting: Family Medicine

## 2022-08-28 ENCOUNTER — Ambulatory Visit (INDEPENDENT_AMBULATORY_CARE_PROVIDER_SITE_OTHER): Payer: Medicare Other | Admitting: Family Medicine

## 2022-08-28 VITALS — BP 117/67 | HR 70 | Ht 67.0 in | Wt 185.0 lb

## 2022-08-28 DIAGNOSIS — E785 Hyperlipidemia, unspecified: Secondary | ICD-10-CM

## 2022-08-28 DIAGNOSIS — Z0001 Encounter for general adult medical examination with abnormal findings: Secondary | ICD-10-CM | POA: Diagnosis not present

## 2022-08-28 DIAGNOSIS — B351 Tinea unguium: Secondary | ICD-10-CM

## 2022-08-28 DIAGNOSIS — L989 Disorder of the skin and subcutaneous tissue, unspecified: Secondary | ICD-10-CM | POA: Diagnosis not present

## 2022-08-28 DIAGNOSIS — I10 Essential (primary) hypertension: Secondary | ICD-10-CM

## 2022-08-28 DIAGNOSIS — E1159 Type 2 diabetes mellitus with other circulatory complications: Secondary | ICD-10-CM

## 2022-08-28 DIAGNOSIS — Z1231 Encounter for screening mammogram for malignant neoplasm of breast: Secondary | ICD-10-CM | POA: Diagnosis not present

## 2022-08-28 MED ORDER — POTASSIUM CHLORIDE CRYS ER 20 MEQ PO TBCR: 20.0000 meq | EXTENDED_RELEASE_TABLET | Freq: Every day | ORAL | 3 refills | Status: DC

## 2022-08-28 MED ORDER — TERBINAFINE HCL 250 MG PO TABS: 250.0000 mg | ORAL_TABLET | Freq: Every day | ORAL | 1 refills | Status: DC

## 2022-08-28 MED ORDER — HYDROCHLOROTHIAZIDE 25 MG PO TABS: 25.0000 mg | ORAL_TABLET | Freq: Every day | ORAL | 3 refills | Status: DC

## 2022-08-28 MED ORDER — AMLODIPINE BESYLATE 5 MG PO TABS: 5.0000 mg | ORAL_TABLET | Freq: Every day | ORAL | 3 refills | Status: DC

## 2022-08-28 MED ORDER — ROSUVASTATIN CALCIUM 10 MG PO TABS: 10.0000 mg | ORAL_TABLET | Freq: Every day | ORAL | 3 refills | Status: DC

## 2022-08-28 NOTE — Assessment & Plan Note (Signed)
Terbinafine x 12 weeks 

## 2022-08-28 NOTE — Assessment & Plan Note (Signed)
Megan Villa is reminded of the importance of commitment to daily physical activity for 30 minutes or more, as able and the need to limit carbohydrate intake to 30 to 60 grams per meal to help with blood sugar control.   The need to take medication as prescribed, test blood sugar as directed, and to call between visits if there is a concern that blood sugar is uncontrolled is also discussed.   Megan Villa is reminded of the importance of daily foot exam, annual eye examination, and good blood sugar, blood pressure and cholesterol control.     Latest Ref Rng & Units 08/25/2022    9:21 AM 05/25/2022    8:54 AM 02/24/2022    9:43 AM 02/20/2022    8:25 AM 09/12/2021    8:09 AM  Diabetic Labs  HbA1c 4.8 - 5.6 % 6.3  6.6   6.6  6.4   Micro/Creat Ratio 0 - 29 mg/g creat   6     Chol 100 - 199 mg/dL 914    782    HDL >95 mg/dL 62    67    Calc LDL 0 - 99 mg/dL 74    93    Triglycerides 0 - 149 mg/dL 69    58    Creatinine 0.57 - 1.00 mg/dL 6.21  3.08   6.57  8.46       08/28/2022    1:05 PM 06/04/2022   11:27 AM 05/28/2022    1:05 PM 04/09/2022    1:07 PM 03/23/2022    1:09 PM 02/24/2022    8:33 AM 11/03/2021    3:57 PM  BP/Weight  Systolic BP 117  962 132  131 952  Diastolic BP 67  77 73  75 75  Wt. (Lbs) 185.04 185 183.08 186.12 189 191.04 192  BMI 28.98 kg/m2 28.13 kg/m2 28.67 kg/m2 29.15 kg/m2 29.6 kg/m2 29.92 kg/m2 30.07 kg/m2      05/07/2021    4:00 PM 05/09/2020    8:00 AM  Foot/eye exam completion dates  Foot Form Completion Done Done      Improved with lifestyle

## 2022-08-28 NOTE — Patient Instructions (Addendum)
F/U mid December, call if you need me sooner  EXCELLENT lifestyle changes with great results  Please schedule mammogram at checkoout  Non fasting HBA1C, and cmp and  EGFR 3 to 5 days before next visit  12 weeks of terbinafine tablets take one tablet once daily is prescribed for fungal toenail infection please collect at your pharmacy and start taking them as soon as possible  You are being referred for 2nd opinion to Dermatology re hyperpigmented skin lesions  Thanks for choosing [facility], we consider it a privelige to serve you.

## 2022-08-28 NOTE — Progress Notes (Unsigned)
    Megan Villa     MRN: [id number]      DOB: 06-11-1955  Chief Complaint  Patient presents with   Annual Exam    CPE    HPI: Patient is in for annual physical exam. No other health concerns are expressed or addressed at the visit. Recent labs,  are reviewed. Immunization is reviewed , and  updated if needed.   PE: Pleasant  female, alert and oriented x 3, in no cardio-pulmonary distress. Afebrile. HEENT No facial trauma or asymetry. Sinuses non tender.  Extra occullar muscles intact.. External ears normal, . Neck: supple, no adenopathy,JVD or thyromegaly.No bruits.  Chest: Clear to ascultation bilaterally.No crackles or wheezes. Non tender to palpation  Breast: No asymetry,no masses or lumps. No tenderness. No nipple discharge or inversion. No axillary or supraclavicular adenopathy  Cardiovascular system; Heart sounds normal,  S1 and  S2 ,no S3.  No murmur, or thrill. Apical beat not displaced Peripheral pulses normal.  Abdomen: Soft, non tender, no organomegaly or masses. No bruits. Bowel sounds normal. No guarding, tenderness or rebound.   GU: External genitalia normal female genitalia , normal female distribution of hair. No lesions. Urethral meatus normal in size, no  Prolapse, no lesions visibly  Present. Bladder non tender. Vagina pink and moist , with no visible lesions , discharge present . Adequate pelvic support no  cystocele or rectocele noted Cervix pink and appears healthy, no lesions or ulcerations noted, no discharge noted from os Uterus normal size, no adnexal masses, no cervical motion or adnexal tenderness.   Musculoskeletal exam: Full ROM of spine, hips , shoulders and knees. No deformity ,swelling or crepitus noted. No muscle wasting or atrophy.   Neurologic: Cranial nerves 2 to 12 intact. Power, tone ,sensation and reflexes normal throughout. No disturbance in gait. No tremor.  Skin: Intact, no ulceration, erythema , scaling or  rash noted. Pigmentation normal throughout  Psych; Normal mood and affect. Judgement and concentration normal   Assessment & Plan:  No problem-specific Assessment & Plan notes found for this encounter.

## 2022-08-28 NOTE — Assessment & Plan Note (Signed)
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

## 2022-08-28 NOTE — Assessment & Plan Note (Signed)

## 2022-08-30 ENCOUNTER — Encounter: Payer: Self-pay | Admitting: Family Medicine

## 2022-09-04 ENCOUNTER — Ambulatory Visit: Payer: Medicare Other | Admitting: Family Medicine

## 2022-09-04 DIAGNOSIS — E139 Other specified diabetes mellitus without complications: Secondary | ICD-10-CM | POA: Diagnosis not present

## 2022-09-04 DIAGNOSIS — E1159 Type 2 diabetes mellitus with other circulatory complications: Secondary | ICD-10-CM

## 2022-09-04 NOTE — Patient Instructions (Addendum)
Megan Villa arrived 09/04/2022 and has given verbal consent to obtain images and complete their overdue diabetic retinal screening.  The images have been sent to an ophthalmologist or optometrist for review and interpretation.  Results will be sent back to Kerri Perches, MD for review.  Patient has been informed they will be contacted when we receive the results via telephone or MyChart

## 2022-09-11 NOTE — Progress Notes (Signed)
 Megan Villa arrived 09/04/2022 and has given verbal consent to obtain images and complete their overdue diabetic retinal screening.  The images have been sent to an ophthalmologist or optometrist for review and interpretation.  Results will be sent back to Kerri Perches, MD for review.  Patient has been informed they will be contacted when we receive the results via telephone or MyChart

## 2022-09-15 NOTE — Progress Notes (Signed)
No diabetic retinopathy detected in R or L eye.

## 2022-09-28 ENCOUNTER — Encounter: Payer: Medicare Other | Attending: Family Medicine | Admitting: Nutrition

## 2022-09-28 ENCOUNTER — Encounter: Payer: Self-pay | Admitting: Nutrition

## 2022-09-28 VITALS — Ht 67.0 in | Wt 188.0 lb

## 2022-09-28 DIAGNOSIS — I10 Essential (primary) hypertension: Secondary | ICD-10-CM

## 2022-09-28 DIAGNOSIS — Z713 Dietary counseling and surveillance: Secondary | ICD-10-CM | POA: Diagnosis not present

## 2022-09-28 DIAGNOSIS — E1159 Type 2 diabetes mellitus with other circulatory complications: Secondary | ICD-10-CM | POA: Diagnosis not present

## 2022-09-28 DIAGNOSIS — E785 Hyperlipidemia, unspecified: Secondary | ICD-10-CM

## 2022-09-28 NOTE — Patient Instructions (Signed)
Goals Established by Pt Goals  Keep up the great job!! Keep focusing on whole plant based foods.

## 2022-09-28 NOTE — Progress Notes (Signed)
Medical Nutrition Therapy  Appointment Start time:  1130 Appointment End time:  1200 Primary concerns today: DM  Referral diagnosis: e11.8 Preferred learning style: No preferences Learning readiness: Ready    NUTRITION ASSESSMENT  DM Follow up A1C down to 6.3% Execise 3 times per week and 2 days she is walking.  Goals set:  Start chair exercises twice a week-done [facility]done Avoid processed foods and high fat foods-done  Anthropometrics  Wt Readings from Last 3 Encounters:  08/28/22 185 lb 0.6 oz (83.9 kg)  06/04/22 185 lb (83.9 kg)  05/28/22 183 lb 1.3 oz (83 kg)   Ht Readings from Last 3 Encounters:  08/28/22 5\' 7"  (1.702 m)  06/04/22 5\' 8"  (1.727 m)  05/28/22 5\' 7"  (1.702 m)   There is no height or weight on file to calculate BMI. @BMIFA @ Facility age limit for growth %iles is 20 years. Facility age limit for growth %iles is 20 years.    Clinical Medical Hx: DM , HTN, HYperlipdiemia Medications: see chart Labs:  Lab Results  Component Value Date   HGBA1C 6.3 (H) 08/25/2022      Latest Ref Rng & Units 08/25/2022    9:21 AM 05/25/2022    8:54 AM 02/20/2022    8:25 AM  CMP  Glucose 70 - 99 mg/dL 161  096  045   BUN 8 - 27 mg/dL 10  12  13    Creatinine 0.57 - 1.00 mg/dL 4.09  8.11  9.14   Sodium 134 - 144 mmol/L 142  141  141   Potassium 3.5 - 5.2 mmol/L 4.2  3.8  4.1   Chloride 96 - 106 mmol/L 102  102  103   CO2 20 - 29 mmol/L 25  24  24    Calcium 8.7 - 10.3 mg/dL 9.5  9.4  9.4   Total Protein 6.0 - 8.5 g/dL 6.9   6.9   Total Bilirubin 0.0 - 1.2 mg/dL 0.3   0.3   Alkaline Phos 44 - 121 IU/L 94   93   AST 0 - 40 IU/L 16   14   ALT 0 - 32 IU/L 11   9    Lipid Panel     Component Value Date/Time   CHOL 150 08/25/2022 0921   TRIG 69 08/25/2022 0921   HDL 62 08/25/2022 0921   CHOLHDL 2.4 08/25/2022 0921   CHOLHDL 2.8 10/13/2019 0709   VLDL 9 04/17/2016 0706   LDLCALC 74 08/25/2022 0921   LDLCALC 111 (H) 10/13/2019 0709    LABVLDL 14 08/25/2022 0921    Notable Signs/Symptoms: NOne   Lifestyle & Dietary Hx LIve with  her husband.  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  Eats most of foods at h ome.  Estimated daily fluid intake: 90 oz Supplements: None Sleep: good Stress / self-care:  Current average weekly physical activity: walks some  24-Hr Dietary Recall B) Fiber one cereal with egg L) [place] sandwich and water  Dinner: baked fish or chicken, spinach, broccoli sprouts, sweet potato, apple, water  Estimated Energy Needs Calories: 1200-1500 Carbohydrate: 135g Protein: 90g Fat: 33g   NUTRITION DIAGNOSIS  NB-1.1 Food and nutrition-related knowledge deficit As related to excessive carbohydrate intake.  As evidenced by A1C 6.6%.   NUTRITION INTERVENTION  Nutrition education (E-1) on the following topics:  Nutrition and Diabetes education provided on My Plate, CHO counting, meal planning, portion sizes, timing of meals, avoiding snacks between meals unless having a  low blood sugar, target ranges for A1C and blood sugars, signs/symptoms and treatment of hyper/hypoglycemia, monitoring blood sugars, taking medications as prescribed, benefits of exercising 30 minutes per day and prevention of complications of DM.  Lifestyle Medicine  - Whole Food, Plant Predominant Nutrition is highly recommended: Eat Plenty of vegetables, Mushrooms, fruits, Legumes, Whole Grains, Nuts, seeds in lieu of processed meats, processed snacks/pastries red meat, poultry, eggs.    -It is better to avoid simple carbohydrates including: Cakes, Sweet Desserts, Ice Cream, Soda (diet and regular), Sweet Tea, Candies, Chips, Cookies, Store Bought Juices, Alcohol in Excess of  1-2 drinks a day, Lemonade,  Artificial Sweeteners, Doughnuts, Coffee Creamers, "Sugar-free" Products, etc, etc.  This is not a complete list.....  Exercise: If you are able: 30 -60 minutes a day ,4 days a week, or 150 minutes a week.  The longer the better.  Combine  stretch, strength, and aerobic activities.  If you were told in the past that you have high risk for cardiovascular diseases, you may seek evaluation by your heart doctor prior to initiating moderate to intense exercise programs.   Handouts Provided Include  Lifestyle Medicine DM Handouts  Learning Style & Readiness for Change Teaching method utilized: Visual & Auditory  Demonstrated degree of understanding via: Teach Back  Barriers to learning/adherence to lifestyle change: none  Goals Established by Pt Goals  Keep up the great job!! Keep focusing on whole plant based foods.   MONITORING & EVALUATION Dietary intake, weekly physical activity, and blood sugars in  PRN months.  Next Steps  Patient is to work on meal planning and exercise.Marland Kitchen

## 2022-10-26 ENCOUNTER — Ambulatory Visit (INDEPENDENT_AMBULATORY_CARE_PROVIDER_SITE_OTHER): Payer: Medicare Other

## 2022-10-26 DIAGNOSIS — Z23 Encounter for immunization: Secondary | ICD-10-CM

## 2022-11-04 ENCOUNTER — Ambulatory Visit (HOSPITAL_COMMUNITY): Payer: Medicare Other

## 2022-11-04 ENCOUNTER — Encounter (HOSPITAL_COMMUNITY): Payer: Self-pay

## 2022-11-04 ENCOUNTER — Ambulatory Visit (HOSPITAL_COMMUNITY)
Admission: RE | Admit: 2022-11-04 | Discharge: 2022-11-04 | Disposition: A | Discharge status: Home / Self Care | Payer: Medicare Other | Source: Ambulatory Visit | Attending: Family Medicine | Admitting: Family Medicine

## 2022-11-04 DIAGNOSIS — Z1231 Encounter for screening mammogram for malignant neoplasm of breast: Secondary | ICD-10-CM | POA: Diagnosis not present

## 2022-11-23 ENCOUNTER — Telehealth: Payer: Self-pay | Admitting: Family Medicine

## 2022-11-23 ENCOUNTER — Other Ambulatory Visit: Payer: Self-pay

## 2022-11-23 MED ORDER — HYDROCHLOROTHIAZIDE 25 MG PO TABS: 25.0000 mg | ORAL_TABLET | Freq: Every day | ORAL | 3 refills | Status: DC

## 2022-11-23 NOTE — Telephone Encounter (Signed)
Prescription Request  11/23/2022  LOV: 08/28/2022  What is the name of the medication or equipment? hydrochlorothiazide (HYDRODIURIL) 25 MG tablet [[id number]]   Have you contacted your pharmacy to request a refill? Yes   Which pharmacy would you like this sent to?  [facility]   Patient notified that their request is being sent to the clinical staff for review and that they should receive a response within 2 business days.   Please advise at Mobile [phone] (mobile)

## 2022-11-23 NOTE — Telephone Encounter (Signed)
Refill sent.

## 2022-12-22 ENCOUNTER — Telehealth: Payer: Self-pay

## 2022-12-22 NOTE — Telephone Encounter (Signed)
Copied from CRM #[id number]. Topic: Clinical - Request for Lab/Test Order >> Dec 21, 2022  2:11 PM [name] wrote: Reason for CRM: Pt cannot remember if she needs to have labs done prior to OV 01/19/23 at 2:40 pm. If blood work is needed pls place orders. Pt can be contact via phone or MyChart.  Please advise if you would like bloodwork

## 2023-01-06 ENCOUNTER — Ambulatory Visit: Payer: Medicare Other | Admitting: Family Medicine

## 2023-01-14 ENCOUNTER — Other Ambulatory Visit: Payer: Self-pay | Admitting: Family Medicine

## 2023-01-14 ENCOUNTER — Other Ambulatory Visit: Payer: Self-pay

## 2023-01-14 MED ORDER — ROSUVASTATIN CALCIUM 10 MG PO TABS: 10.0000 mg | ORAL_TABLET | Freq: Every day | ORAL | 3 refills | Status: DC

## 2023-01-14 NOTE — Telephone Encounter (Signed)
Copied from CRM [id number]. Topic: Clinical - Medication Refill >> Jan 14, 2023 11:20 AM Elle L wrote: Most Recent Primary Care Visit:  Provider: Juliane Lack D  Department: [facility]  Visit Type: NURSE VISIT  Date: 10/26/2022  Medication: rosuvastatin (CRESTOR) 10 MG tablet  Has the patient contacted their pharmacy? Yes  Is this the correct pharmacy for this prescription? Yes  This is the patient's preferred pharmacy:  [facility] [address], [place] - [address] [place] [address] Phone: [phone] Fax: [phone]  [facility] [place], [place] - [address] [place] [address] Phone: [phone] Fax: [phone]   Has the prescription been filled recently? Yes  Is the patient out of the medication? Yes  Has the patient been seen for an appointment in the last year OR does the patient have an upcoming appointment? Yes  Can we respond through MyChart? Yes  Agent: Please be advised that Rx refills may take up to 3 business days. We ask that you follow-up with your pharmacy.

## 2023-01-15 DIAGNOSIS — I10 Essential (primary) hypertension: Secondary | ICD-10-CM | POA: Diagnosis not present

## 2023-01-15 DIAGNOSIS — E1159 Type 2 diabetes mellitus with other circulatory complications: Secondary | ICD-10-CM | POA: Diagnosis not present

## 2023-01-16 LAB — CMP14+EGFR
ALT: 15 [IU]/L (ref 0–32)
AST: 18 [IU]/L (ref 0–40)
Albumin: 4.4 g/dL (ref 3.9–4.9)
Alkaline Phosphatase: 103 [IU]/L (ref 44–121)
BUN/Creatinine Ratio: 18 (ref 12–28)
BUN: 13 mg/dL (ref 8–27)
Bilirubin Total: 0.4 mg/dL (ref 0.0–1.2)
CO2: 24 mmol/L (ref 20–29)
Calcium: 9.7 mg/dL (ref 8.7–10.3)
Chloride: 102 mmol/L (ref 96–106)
Creatinine, Ser: 0.72 mg/dL (ref 0.57–1.00)
Globulin, Total: 2.4 g/dL (ref 1.5–4.5)
Glucose: 116 mg/dL — ABNORMAL HIGH (ref 70–99)
Potassium: 4.2 mmol/L (ref 3.5–5.2)
Sodium: 140 mmol/L (ref 134–144)
Total Protein: 6.8 g/dL (ref 6.0–8.5)
eGFR: 93 mL/min/{1.73_m2} (ref >59)

## 2023-01-16 LAB — HEMOGLOBIN A1C
Est. average glucose Bld gHb Est-mCnc: 134 mg/dL
Hgb A1c MFr Bld: 6.3 % — ABNORMAL HIGH (ref 4.8–5.6)

## 2023-01-19 ENCOUNTER — Encounter: Payer: Self-pay | Admitting: Family Medicine

## 2023-01-19 ENCOUNTER — Ambulatory Visit (INDEPENDENT_AMBULATORY_CARE_PROVIDER_SITE_OTHER): Payer: Medicare Other | Admitting: Family Medicine

## 2023-01-19 VITALS — BP 110/70 | HR 70 | Ht 67.0 in | Wt 187.0 lb

## 2023-01-19 DIAGNOSIS — E559 Vitamin D deficiency, unspecified: Secondary | ICD-10-CM

## 2023-01-19 DIAGNOSIS — I10 Essential (primary) hypertension: Secondary | ICD-10-CM

## 2023-01-19 DIAGNOSIS — E1169 Type 2 diabetes mellitus with other specified complication: Secondary | ICD-10-CM | POA: Diagnosis not present

## 2023-01-19 DIAGNOSIS — Z1211 Encounter for screening for malignant neoplasm of colon: Secondary | ICD-10-CM

## 2023-01-19 DIAGNOSIS — E785 Hyperlipidemia, unspecified: Secondary | ICD-10-CM | POA: Diagnosis not present

## 2023-01-19 DIAGNOSIS — E1159 Type 2 diabetes mellitus with other circulatory complications: Secondary | ICD-10-CM

## 2023-01-19 DIAGNOSIS — E663 Overweight: Secondary | ICD-10-CM

## 2023-01-19 NOTE — Patient Instructions (Signed)
 Follow-up mid May, call if you need me sooner.  No change in current medications.  Work on committing to regular exercise and increasing vegetable intake reduce and carbohydrates and sweets and with weight loss goal of 5 to 10 pounds in the next 5 months.  Fasting CBC lipid CMP and EGFR HbA1c TSH and vitamin D  to be drawn 3 to 5 days before your May appointment.  I will refer you for a colonoscopy once you decide on the provider and location that you want the study done.  I am sending a message to you so that you can directly send me this information.  Updated COVID-vaccine is recommended and this is at your pharmacy.  Continue good health habits, and all the best for 2025.

## 2023-01-20 DIAGNOSIS — E559 Vitamin D deficiency, unspecified: Secondary | ICD-10-CM | POA: Insufficient documentation

## 2023-01-20 NOTE — Assessment & Plan Note (Signed)
 Controlled, no change in medication DASH diet and commitment to daily physical activity for a minimum of 30 minutes discussed and encouraged, as a part of hypertension management. The importance of attaining a healthy weight is also discussed.     01/19/2023    3:06 PM 01/19/2023    2:33 PM 09/28/2022    2:58 PM 08/28/2022    1:05 PM 06/04/2022   11:27 AM 05/28/2022    1:05 PM 04/09/2022    1:07 PM  BP/Weight  Systolic BP 110 128  117  133 867  Diastolic BP 70 75  67  77 73  Wt. (Lbs)  187 188 185.04 185 183.08 186.12  BMI  29.29 kg/m2 29.44 kg/m2 28.98 kg/m2 28.13 kg/m2 28.67 kg/m2 29.15 kg/m2     '

## 2023-01-20 NOTE — Assessment & Plan Note (Addendum)
 Diabetes associated with hypertension and hyperlipidemia  Ms. Nanez is reminded of the importance of commitment to daily physical activity for 30 minutes or more, as able and the need to limit carbohydrate intake to 30 to 60 grams per meal to help with blood sugar control.   Diet controlled Ms. Fasching is reminded of the importance of daily foot exam, annual eye examination, and good blood sugar, blood pressure and cholesterol control.     Latest Ref Rng & Units 01/15/2023    9:05 AM 08/25/2022    9:21 AM 05/25/2022    8:54 AM 02/24/2022    9:43 AM 02/20/2022    8:25 AM  Diabetic Labs  HbA1c 4.8 - 5.6 % 6.3  6.3  6.6   6.6   Micro/Creat Ratio 0 - 29 mg/g creat    6    Chol 100 - 199 mg/dL  849    828   HDL >60 mg/dL  62    67   Calc LDL 0 - 99 mg/dL  74    93   Triglycerides 0 - 149 mg/dL  69    58   Creatinine 0.57 - 1.00 mg/dL 9.27  9.21  9.27   9.23       01/19/2023    3:06 PM 01/19/2023    2:33 PM 09/28/2022    2:58 PM 08/28/2022    1:05 PM 06/04/2022   11:27 AM 05/28/2022    1:05 PM 04/09/2022    1:07 PM  BP/Weight  Systolic BP 110 128  117  133 867  Diastolic BP 70 75  67  77 73  Wt. (Lbs)  187 188 185.04 185 183.08 186.12  BMI  29.29 kg/m2 29.44 kg/m2 28.98 kg/m2 28.13 kg/m2 28.67 kg/m2 29.15 kg/m2      Latest Ref Rng & Units 09/04/2022    9:12 AM 08/28/2022    1:00 PM  Foot/eye exam completion dates  Eye Exam No Retinopathy No Retinopathy       Foot Form Completion   Done     This result is from an external source.

## 2023-01-20 NOTE — Progress Notes (Signed)
 Megan Villa     MRN: [id number]      DOB: [birth year]  Chief Complaint  Patient presents with   Follow-up    Follow up    HPI Megan Villa is here for follow up and re-evaluation of chronic medical conditions, medication management and review of any available recent lab and radiology data.  Preventive health is updated, specifically  Cancer screening and Immunization.   Questions or concerns regarding consultations or procedures which the PT has had in the interim are  addressed. The PT denies any adverse reactions to current medications since the last visit.  There are no new concerns.  There are no specific complaints   ROS Denies recent fever or chills. Denies sinus pressure, nasal congestion, ear pain or sore throat. Denies chest congestion, productive cough or wheezing. Denies chest pains, palpitations and leg swelling Denies abdominal pain, nausea, vomiting,diarrhea or constipation.   Denies dysuria, frequency, hesitancy or incontinence. Denies joint pain, swelling and limitation in mobility. Denies headaches, seizures, numbness, or tingling. Denies depression, anxiety or insomnia. Denies skin break down or rash.   PE  BP 110/70   Pulse 70   Ht 5' 7 (1.702 m)   Wt 187 lb (84.8 kg)   SpO2 97%   BMI 29.29 kg/m   Patient alert and oriented and in no cardiopulmonary distress.  HEENT: No facial asymmetry, EOMI,     Neck supple .  Chest: Clear to auscultation bilaterally.  CVS: S1, S2 no murmurs, no S3.Regular rate.  ABD: Soft non tender.   Ext: No edema  MS: Adequate ROM spine, shoulders, hips and knees.  Skin: Intact, no ulcerations or rash noted.  Psych: Good eye contact, normal affect. Memory intact not anxious or depressed appearing.  CNS: CN 2-12 intact, power,  normal throughout.no focal deficits noted.   Assessment & Plan  Essential hypertension Controlled, no change in medication DASH diet and commitment to daily physical activity for a  minimum of 30 minutes discussed and encouraged, as a part of hypertension management. The importance of attaining a healthy weight is also discussed.     01/19/2023    3:06 PM 01/19/2023    2:33 PM 09/28/2022    2:58 PM 08/28/2022    1:05 PM 06/04/2022   11:27 AM 05/28/2022    1:05 PM 04/09/2022    1:07 PM  BP/Weight  Systolic BP 110 128  117  133 867  Diastolic BP 70 75  67  77 73  Wt. (Lbs)  187 188 185.04 185 183.08 186.12  BMI  29.29 kg/m2 29.44 kg/m2 28.98 kg/m2 28.13 kg/m2 28.67 kg/m2 29.15 kg/m2     '  Type 2 diabetes mellitus with other specified complication (HCC) Diabetes associated with hypertension and hyperlipidemia  Megan Villa is reminded of the importance of commitment to daily physical activity for 30 minutes or more, as able and the need to limit carbohydrate intake to 30 to 60 grams per meal to help with blood sugar control.   Diet controlled Megan Villa is reminded of the importance of daily foot exam, annual eye examination, and good blood sugar, blood pressure and cholesterol control.     Latest Ref Rng & Units 01/15/2023    9:05 AM 08/25/2022    9:21 AM 05/25/2022    8:54 AM 02/24/2022    9:43 AM 02/20/2022    8:25 AM  Diabetic Labs  HbA1c 4.8 - 5.6 % 6.3  6.3  6.6   6.6   Micro/Creat  Ratio 0 - 29 mg/g creat    6    Chol 100 - 199 mg/dL  849    828   HDL >60 mg/dL  62    67   Calc LDL 0 - 99 mg/dL  74    93   Triglycerides 0 - 149 mg/dL  69    58   Creatinine 0.57 - 1.00 mg/dL 9.27  9.21  9.27   9.23       01/19/2023    3:06 PM 01/19/2023    2:33 PM 09/28/2022    2:58 PM 08/28/2022    1:05 PM 06/04/2022   11:27 AM 05/28/2022    1:05 PM 04/09/2022    1:07 PM  BP/Weight  Systolic BP 110 128  117  133 867  Diastolic BP 70 75  67  77 73  Wt. (Lbs)  187 188 185.04 185 183.08 186.12  BMI  29.29 kg/m2 29.44 kg/m2 28.98 kg/m2 28.13 kg/m2 28.67 kg/m2 29.15 kg/m2      Latest Ref Rng & Units 09/04/2022    9:12 AM 08/28/2022    1:00 PM  Foot/eye exam completion  dates  Eye Exam No Retinopathy No Retinopathy       Foot Form Completion   Done     This result is from an external source.        Hyperlipidemia LDL goal <100 Hyperlipidemia:Low fat diet discussed and encouraged.   Lipid Panel  Lab Results  Component Value Date   CHOL 150 08/25/2022   HDL 62 08/25/2022   LDLCALC 74 08/25/2022   TRIG 69 08/25/2022   CHOLHDL 2.4 08/25/2022     Updated lab needed at/ before next visit.   Overweight (BMI 25.0-29.9) Patient re-educated about  the importance of commitment to a  minimum of 150 minutes of exercise per week as able. Unchanged  The importance of healthy food choices with portion control discussed, as well as eating regularly and within a 12 hour window most days. The need to choose clean , green food 50 to 75% of the time is discussed, as well as to make water the primary drink and set a goal of 64 ounces water daily.       01/19/2023    2:33 PM 09/28/2022    2:58 PM 08/28/2022    1:05 PM  Weight /BMI  Weight 187 lb 188 lb 185 lb 0.6 oz  Height 5' 7 (1.702 m) 5' 7 (1.702 m) 5' 7 (1.702 m)  BMI 29.29 kg/m2 29.44 kg/m2 28.98 kg/m2

## 2023-01-20 NOTE — Assessment & Plan Note (Signed)
 Patient re-educated about  the importance of commitment to a  minimum of 150 minutes of exercise per week as able. Unchanged  The importance of healthy food choices with portion control discussed, as well as eating regularly and within a 12 hour window most days. The need to choose clean , green food 50 to 75% of the time is discussed, as well as to make water the primary drink and set a goal of 64 ounces water daily.       01/19/2023    2:33 PM 09/28/2022    2:58 PM 08/28/2022    1:05 PM  Weight /BMI  Weight 187 lb 188 lb 185 lb 0.6 oz  Height 5' 7 (1.702 m) 5' 7 (1.702 m) 5' 7 (1.702 m)  BMI 29.29 kg/m2 29.44 kg/m2 28.98 kg/m2

## 2023-01-20 NOTE — Assessment & Plan Note (Signed)
 Hyperlipidemia:Low fat diet discussed and encouraged.   Lipid Panel  Lab Results  Component Value Date   CHOL 150 08/25/2022   HDL 62 08/25/2022   LDLCALC 74 08/25/2022   TRIG 69 08/25/2022   CHOLHDL 2.4 08/25/2022     Updated lab needed at/ before next visit.

## 2023-01-20 NOTE — Assessment & Plan Note (Signed)
 Updated lab needed at/ before next visit.

## 2023-01-25 NOTE — Addendum Note (Signed)
 Addended by: Kerri Perches on: 01/25/2023 09:13 AM   Modules accepted: Orders

## 2023-01-28 ENCOUNTER — Encounter: Payer: Self-pay | Admitting: *Deleted

## 2023-02-08 ENCOUNTER — Telehealth: Payer: Self-pay | Admitting: *Deleted

## 2023-02-08 ENCOUNTER — Telehealth: Payer: Self-pay | Admitting: Internal Medicine

## 2023-02-08 DIAGNOSIS — Z1211 Encounter for screening for malignant neoplasm of colon: Secondary | ICD-10-CM

## 2023-02-08 NOTE — Telephone Encounter (Signed)
Patient called the office because she keeps getting messages to call our office.  She returned her colonoscopy questionnaire and it is currently in review and I explained to the patient that once the schedulers received from providers they would be giving her a call to get her scheduled.

## 2023-02-08 NOTE — Telephone Encounter (Signed)
  Procedure: COLONOSCOPY  Estimated body mass index is 29.29 kg/m as calculated from the following:   Height as of 01/19/23: 5\' 7"  (1.702 m).   Weight as of 01/19/23: 187 lb (84.8 kg).  Have you had a colonoscopy before?  2010  Do you have family history of colon cancer?  NO  Do you have a family history of polyps? NO  Previous colonoscopy with polyps removed?   Do you have a history colorectal cancer?   NO  Are you diabetic?  TYPE 2  Do you have a prosthetic or mechanical heart valve? NO  Do you have a pacemaker/defibrillator?   NO  Have you had endocarditis/atrial fibrillation?  NO  Do you use supplemental oxygen/CPAP?  NO  Have you had joint replacement within the last 12 months?  NO  Do you tend to be constipated or have to use laxatives?  NO   Do you have history of alcohol use? If yes, how much and how often.  NO  Do you have history or are you using drugs? If yes, what do are you  using?  NO  Have you ever had a stroke/heart attack?  NO  Have you ever had a heart or other vascular stent placed,?NO  Do you take weight loss medication? NO  female patients,: have you had a hysterectomy? NO                              are you post menopausal?  NO                              do you still have your menstrual cycle? NO    Date of last menstrual period?   Do you take any blood-thinning medications such as: (Plavix, aspirin, Coumadin, Aggrenox, Brilinta, Xarelto, Eliquis, Pradaxa, Savaysa or Effient)? NO  If yes we need the name, milligram, dosage and who is prescribing doctor:               Current Outpatient Medications  Medication Sig Dispense Refill   amLODipine (NORVASC) 5 MG tablet Take 1 tablet (5 mg total) by mouth daily. 90 tablet 3   Cholecalciferol (VITAMIN D3 PO) Take 2,000 Units by mouth daily.     hydrochlorothiazide (HYDRODIURIL) 25 MG tablet Take 1 tablet (25 mg total) by mouth daily. 90 tablet 3   potassium chloride SA (KLOR-CON M20) 20 MEQ  tablet Take 1 tablet (20 mEq total) by mouth daily. 90 tablet 3   rosuvastatin (CRESTOR) 10 MG tablet Take 1 tablet (10 mg total) by mouth daily. 90 tablet 3   Blood Glucose Monitoring Suppl DEVI 1 each by Does not apply route in the morning, at noon, and at bedtime. May substitute to any manufacturer covered by patient's insurance. 1 each 0   Blood Glucose Monitoring Suppl DEVI 1 each by Does not apply route in the morning, at noon, and at bedtime. May substitute to any manufacturer covered by patient's insurance. 1 each 0   No current facility-administered medications for this visit.    Allergies  Allergen Reactions   Lisinopril Cough   Penicillins     REACTION: In high dosage

## 2023-02-10 ENCOUNTER — Encounter (INDEPENDENT_AMBULATORY_CARE_PROVIDER_SITE_OTHER): Payer: Self-pay | Admitting: *Deleted

## 2023-02-10 MED ORDER — PEG 3350-KCL-NA BICARB-NACL 420 G PO SOLR: 4000.0000 mL | Freq: Once | ORAL | 0 refills | Status: AC

## 2023-02-10 NOTE — Telephone Encounter (Signed)
Referral completed, [facility] apt letter sent to PCP

## 2023-02-10 NOTE — Addendum Note (Signed)
Addended by: Armstead Peaks on: 02/10/2023 09:50 AM   Modules accepted: Orders

## 2023-02-10 NOTE — Telephone Encounter (Signed)
Spoke with patient. Offered several dates in February but wanted a Monday. Could not do 2/10 so she scheduled for 3/10 at 7:30am. Aware will send prep rx to pharmacy (no samples available currently). She was asking about price for procedure and I advised her she will get call from pre service center. Instructions to be mailed also.

## 2023-02-22 DIAGNOSIS — R6889 Other general symptoms and signs: Secondary | ICD-10-CM | POA: Diagnosis not present

## 2023-03-24 ENCOUNTER — Other Ambulatory Visit (HOSPITAL_COMMUNITY)
Admission: RE | Admit: 2023-03-24 | Discharge: 2023-03-24 | Disposition: A | Discharge status: Home / Self Care | Source: Ambulatory Visit | Attending: Internal Medicine | Admitting: Internal Medicine

## 2023-03-24 DIAGNOSIS — Z1211 Encounter for screening for malignant neoplasm of colon: Secondary | ICD-10-CM | POA: Diagnosis not present

## 2023-03-24 LAB — BASIC METABOLIC PANEL
Anion gap: 8 (ref 5–15)
BUN: 13 mg/dL (ref 8–23)
CO2: 29 mmol/L (ref 22–32)
Calcium: 9.4 mg/dL (ref 8.9–10.3)
Chloride: 103 mmol/L (ref 98–111)
Creatinine, Ser: 0.71 mg/dL (ref 0.44–1.00)
GFR, Estimated: >60 mL/min (ref >60)
Glucose, Bld: 126 mg/dL — ABNORMAL HIGH (ref 70–99)
Potassium: 3.5 mmol/L (ref 3.5–5.1)
Sodium: 140 mmol/L (ref 135–145)

## 2023-03-29 ENCOUNTER — Ambulatory Visit (HOSPITAL_COMMUNITY)
Admission: RE | Admit: 2023-03-29 | Discharge: 2023-03-29 | Disposition: A | Discharge status: Home / Self Care | Payer: Medicare Other | Attending: Internal Medicine | Admitting: Internal Medicine

## 2023-03-29 ENCOUNTER — Other Ambulatory Visit: Payer: Self-pay

## 2023-03-29 ENCOUNTER — Encounter (HOSPITAL_COMMUNITY)
Admission: RE | Disposition: A | Discharge status: Home / Self Care | Payer: Self-pay | Source: Home / Self Care | Attending: Internal Medicine

## 2023-03-29 ENCOUNTER — Encounter (HOSPITAL_COMMUNITY): Payer: Self-pay | Admitting: Internal Medicine

## 2023-03-29 ENCOUNTER — Ambulatory Visit (HOSPITAL_COMMUNITY): Admitting: Anesthesiology

## 2023-03-29 DIAGNOSIS — E119 Type 2 diabetes mellitus without complications: Secondary | ICD-10-CM | POA: Insufficient documentation

## 2023-03-29 DIAGNOSIS — Z1211 Encounter for screening for malignant neoplasm of colon: Secondary | ICD-10-CM | POA: Insufficient documentation

## 2023-03-29 DIAGNOSIS — I10 Essential (primary) hypertension: Secondary | ICD-10-CM | POA: Insufficient documentation

## 2023-03-29 HISTORY — DX: Type 2 diabetes mellitus without complications: E11.9

## 2023-03-29 LAB — GLUCOSE, CAPILLARY: Glucose-Capillary: 102 mg/dL — ABNORMAL HIGH (ref 70–99)

## 2023-03-29 SURGERY — COLONOSCOPY WITH PROPOFOL
Anesthesia: General

## 2023-03-29 MED ORDER — PROPOFOL 10 MG/ML IV BOLUS
INTRAVENOUS | Status: DC | PRN
  Administered 2023-03-29: 20 mg via INTRAVENOUS
  Administered 2023-03-29: 30 mg via INTRAVENOUS
  Administered 2023-03-29: 40 mg via INTRAVENOUS
  Administered 2023-03-29: 70 mg via INTRAVENOUS

## 2023-03-29 MED ORDER — LIDOCAINE HCL (PF) 2 % IJ SOLN
INTRAMUSCULAR | Status: AC
  Filled 2023-03-29: qty 5

## 2023-03-29 MED ORDER — PROPOFOL 500 MG/50ML IV EMUL
INTRAVENOUS | Status: DC | PRN
  Administered 2023-03-29: 150 ug/kg/min via INTRAVENOUS

## 2023-03-29 MED ORDER — PROPOFOL 1000 MG/100ML IV EMUL
INTRAVENOUS | Status: AC
  Filled 2023-03-29: qty 100

## 2023-03-29 MED ORDER — LIDOCAINE HCL (CARDIAC) PF 100 MG/5ML IV SOSY
PREFILLED_SYRINGE | INTRAVENOUS | Status: DC | PRN
  Administered 2023-03-29: 60 mg via INTRATRACHEAL

## 2023-03-29 MED ORDER — LACTATED RINGERS IV SOLN: INTRAVENOUS | Status: DC

## 2023-03-29 NOTE — Discharge Instructions (Signed)
  Colonoscopy Discharge Instructions  Read the instructions outlined below and refer to this sheet in the next few weeks. These discharge instructions provide you with general information on caring for yourself after you leave the hospital. Your doctor may also give you specific instructions. While your treatment has been planned according to the most current medical practices available, unavoidable complications occasionally occur. If you have any problems or questions after discharge, call Dr. Jena Gauss at [phone]. ACTIVITY You may resume your regular activity, but move at a slower pace for the next 24 hours.  Take frequent rest periods for the next 24 hours.  Walking will help get rid of the air and reduce the bloated feeling in your belly (abdomen).  No driving for 24 hours (because of the medicine (anesthesia) used during the test).   Do not sign any important legal documents or operate any machinery for 24 hours (because of the anesthesia used during the test).  NUTRITION Drink plenty of fluids.  You may resume your normal diet as instructed by your doctor.  Begin with a light meal and progress to your normal diet. Heavy or fried foods are harder to digest and may make you feel sick to your stomach (nauseated).  Avoid alcoholic beverages for 24 hours or as instructed.  MEDICATIONS You may resume your normal medications unless your doctor tells you otherwise.  WHAT YOU CAN EXPECT TODAY Some feelings of bloating in the abdomen.  Passage of more gas than usual.  Spotting of blood in your stool or on the toilet paper.  IF YOU HAD POLYPS REMOVED DURING THE COLONOSCOPY: No aspirin products for 7 days or as instructed.  No alcohol for 7 days or as instructed.  Eat a soft diet for the next 24 hours.  FINDING OUT THE RESULTS OF YOUR TEST Not all test results are available during your visit. If your test results are not back during the visit, make an appointment with your caregiver to find out the  results. Do not assume everything is normal if you have not heard from your caregiver or the medical facility. It is important for you to follow up on all of your test results.  SEEK IMMEDIATE MEDICAL ATTENTION IF: You have more than a spotting of blood in your stool.  Your belly is swollen (abdominal distention).  You are nauseated or vomiting.  You have a temperature over 101.  You have abdominal pain or discomfort that is severe or gets worse throughout the day.      Your colon appeared normal today  It is recommended you return for a repeat colonoscopy in 10 years for screening purposes  At patient request, I called John at [phone] rolled to voicemail.  Left a message.

## 2023-03-29 NOTE — H&P (Addendum)
 @LOGO @   Primary Care Physician:  Kerri Perches, MD Primary Gastroenterologist:  Dr. Jena Gauss  Pre-Procedure History & Physical: HPI:  Megan Villa is a 68 y.o. female is here for a screening colonoscopy.  Reported negative colonoscopy 2010.  No bowel symptoms.  No family history colon cancer.  Past Medical History:  Diagnosis Date   Annual physical exam 09/11/2015   Constipation    Diabetes mellitus without complication (HCC)    Hyperlipidemia    Hypertension    IGT (impaired glucose tolerance)    Metabolic syndrome X 06/25/2013   Obesity    Seasonal allergies     Past Surgical History:  Procedure Laterality Date   CYSTECTOMY     left thumb    CYSTECTOMY     left elbow    TUBAL LIGATION      Prior to Admission medications   Medication Sig Start Date End Date Taking? Authorizing Provider  amLODipine (NORVASC) 5 MG tablet Take 1 tablet (5 mg total) by mouth daily. 08/28/22  Yes Kerri Perches, MD  aspirin EC 81 MG tablet Take 81 mg by mouth daily. Swallow whole.   Yes [provider]  Cholecalciferol (VITAMIN D3 PO) Take 2,000 Units by mouth daily.   Yes [provider]  hydrochlorothiazide (HYDRODIURIL) 25 MG tablet Take 1 tablet (25 mg total) by mouth daily. 11/23/22  Yes Kerri Perches, MD  potassium chloride SA (KLOR-CON M20) 20 MEQ tablet Take 1 tablet (20 mEq total) by mouth daily. 08/28/22  Yes Kerri Perches, MD  Blood Glucose Monitoring Suppl DEVI 1 each by Does not apply route in the morning, at noon, and at bedtime. May substitute to any manufacturer covered by patient's insurance. 06/02/22   Kerri Perches, MD  Blood Glucose Monitoring Suppl DEVI 1 each by Does not apply route in the morning, at noon, and at bedtime. May substitute to any manufacturer covered by patient's insurance. 06/02/22   Kerri Perches, MD  rosuvastatin (CRESTOR) 10 MG tablet Take 1 tablet (10 mg total) by mouth daily. 01/14/23   Kerri Perches,  MD    Allergies as of 02/10/2023 - Review Complete 02/08/2023  Allergen Reaction Noted   Lisinopril Cough 09/18/2020   Penicillins  01/17/2008    Family History  Problem Relation Age of Onset   Diabetes Mother    Diabetes Father    Hypertension Father    Stroke Father    Diabetes Sister    Diabetes Brother    Diabetes Brother    Arthritis Unknown        family history     Social History   Socioeconomic History   Marital status: Married    Spouse name: Not on file   Number of children: Not on file   Years of education: Not on file   Highest education level: Not on file  Occupational History   Not on file  Tobacco Use   Smoking status: Never   Smokeless tobacco: Never  Vaping Use   Vaping status: Never Used  Substance and Sexual Activity   Alcohol use: No   Drug use: No   Sexual activity: Not Currently  Other Topics Concern   Not on file  Social History Narrative   Not on file   Social Drivers of Health   Financial Resource Strain: Not on file  Food Insecurity: Not on file  Transportation Needs: Not on file  Physical Activity: Not on file  Stress: Not on file  Social Connections: Unknown (06/03/2021)   Received from [facility], [facility]   Social Network    Social Network: Not on file  Intimate Partner Violence: Unknown (04/25/2021)   Received from [facility], [facility]   HITS    Physically Hurt: Not on file    Insult or Talk Down To: Not on file    Threaten Physical Harm: Not on file    Scream or Curse: Not on file    Review of Systems: See HPI, otherwise negative ROS  Physical Exam: BP (!) 154/73   Pulse (!) 104   Temp 98.4 F (36.9 C) (Oral)   Resp (!) 21   Ht 5\' 8"  (1.727 m)   Wt 83.9 kg   SpO2 100%   BMI 28.13 kg/m  General:   Alert,  Well-developed, well-nourished, pleasant and cooperative in NAD Lungs:  Clear throughout to auscultation.   No wheezes, crackles, or rhonchi. No acute distress. Heart:  Regular rate and  rhythm; no murmurs, clicks, rubs,  or gallops. Abdomen:  Soft, nontender and nondistended. No masses, hepatosplenomegaly or hernias noted. Normal bowel sounds, without guarding, and without rebound.    Impression/Plan: Megan Villa is now here to undergo a screening colonoscopy.  Average risk screening examination  Risks, benefits, limitations, imponderables and alternatives regarding colonoscopy have been reviewed with the patient. Questions have been answered. All parties agreeable.     Notice:  This dictation was prepared with Dragon dictation along with smaller phrase technology. Any transcriptional errors that result from this process are unintentional and may not be corrected upon review.

## 2023-03-29 NOTE — Anesthesia Postprocedure Evaluation (Signed)
 Anesthesia Post Note  Patient: Megan Villa  Procedure(s) Performed: COLONOSCOPY WITH PROPOFOL  Patient location during evaluation: Phase II Anesthesia Type: General Level of consciousness: awake Pain management: pain level controlled Vital Signs Assessment: post-procedure vital signs reviewed and stable Respiratory status: spontaneous breathing and respiratory function stable Cardiovascular status: blood pressure returned to baseline and stable Postop Assessment: no headache and no apparent nausea or vomiting Anesthetic complications: no Comments: Late entry   No notable events documented.   Last Vitals:  Vitals:   03/29/23 0700 03/29/23 0801  BP: (!) 154/73 (!) 102/52  Pulse: (!) 104 70  Resp: (!) 21 14  Temp: 36.9 C 36.4 C  SpO2: 100% 100%    Last Pain:  Vitals:   03/29/23 0801  TempSrc: Axillary  PainSc: 0-No pain                 Windell Norfolk

## 2023-03-29 NOTE — Transfer of Care (Addendum)
 Immediate Anesthesia Transfer of Care Note  Patient: Megan Villa  Procedure(s) Performed: COLONOSCOPY WITH PROPOFOL  Patient Location: Endoscopy Unit  Anesthesia Type:General  Level of Consciousness: drowsy and patient cooperative  Airway & Oxygen Therapy: Patient Spontanous Breathing and Patient connected to nasal cannula oxygen  Post-op Assessment: Report given to RN and Post -op Vital signs reviewed and stable  Post vital signs: Reviewed and stable  Last Vitals:  Vitals Value Taken Time  BP 102/52 03/29/23   0801  Temp 36.4 03/29/23   0801  Pulse 70 03/29/23   0801  Resp 14 03/29/23   0801  SpO2 100% 03/29/23   0801    Last Pain:  Vitals:   03/29/23 0734  TempSrc:   PainSc: 0-No pain      Patients Stated Pain Goal: 8 (03/29/23 0645)  Complications: No notable events documented.

## 2023-03-29 NOTE — Anesthesia Preprocedure Evaluation (Signed)
 Anesthesia Evaluation  Patient identified by MRN, date of birth, ID band Patient awake    Reviewed: Allergy & Precautions, H&P , NPO status , Patient's Chart, lab work & pertinent test results, reviewed documented beta blocker date and time   Airway Mallampati: II  TM Distance: >3 FB Neck ROM: full    Dental no notable dental hx.    Pulmonary neg pulmonary ROS   Pulmonary exam normal breath sounds clear to auscultation       Cardiovascular Exercise Tolerance: Good hypertension, negative cardio ROS  Rhythm:regular Rate:Normal     Neuro/Psych negative neurological ROS  negative psych ROS   GI/Hepatic negative GI ROS, Neg liver ROS,,,  Endo/Other  negative endocrine ROSdiabetes    Renal/GU negative Renal ROS  negative genitourinary   Musculoskeletal   Abdominal   Peds  Hematology negative hematology ROS (+)   Anesthesia Other Findings   Reproductive/Obstetrics negative OB ROS                             Anesthesia Physical Anesthesia Plan  ASA: 2  Anesthesia Plan: General   Post-op Pain Management:    Induction:   PONV Risk Score and Plan: Propofol infusion  Airway Management Planned:   Additional Equipment:   Intra-op Plan:   Post-operative Plan:   Informed Consent: I have reviewed the patients History and Physical, chart, labs and discussed the procedure including the risks, benefits and alternatives for the proposed anesthesia with the patient or authorized representative who has indicated his/her understanding and acceptance.     Dental Advisory Given  Plan Discussed with: CRNA  Anesthesia Plan Comments:        Anesthesia Quick Evaluation

## 2023-03-29 NOTE — Op Note (Signed)
 [facility] Patient Name: Megan Villa Procedure Date: 03/29/2023 7:08 AM MRN: [id number] Date of Birth: Mar 01, 1955 Attending MD: Gennette Pac , MD, [id number] CSN: [id number] Age: 68 Admit Type: Outpatient Procedure:                Colonoscopy Indications:              Screening for colorectal malignant neoplasm Providers:                Gennette Pac, MD, Angelica Ran, Elinor Parkinson Referring MD:              Medicines:                Propofol per Anesthesia Complications:            No immediate complications. Estimated Blood Loss:     Estimated blood loss: none. Procedure:                Pre-Anesthesia Assessment:                           - Prior to the procedure, a History and Physical                            was performed, and patient medications and                            allergies were reviewed. The patient's tolerance of                            previous anesthesia was also reviewed. The risks                            and benefits of the procedure and the sedation                            options and risks were discussed with the patient.                            All questions were answered, and informed consent                            was obtained. Prior Anticoagulants: The patient has                            taken no anticoagulant or antiplatelet agents. ASA                            Grade Assessment: II - A patient with mild systemic                            disease. After reviewing the risks and benefits,  the patient was deemed in satisfactory condition to                            undergo the procedure.                           After obtaining informed consent, the colonoscope                            was passed under direct vision. Throughout the                            procedure, the patient's blood pressure, pulse, and                            oxygen  saturations were monitored continuously. The                            [id number]) scope was introduced through the                            anus and advanced to the the cecum, identified by                            appendiceal orifice and ileocecal valve. The                            colonoscopy was performed without difficulty. The                            patient tolerated the procedure well. The quality                            of the bowel preparation was adequate. The                            ileocecal valve, appendiceal orifice, and rectum                            were photographed. Scope In: 7:40:51 AM Scope Out: 7:56:32 AM Scope Withdrawal Time: 0 hours 10 minutes 16 seconds  Total Procedure Duration: 0 hours 15 minutes 41 seconds  Findings:      The perianal and digital rectal examinations were normal.      The colon (entire examined portion) appeared normal. Minimal residual of       vegetable matter stool debris copiously washed and suctioned out to gain       adequate view. The colonic mucosa appeared normal. Rectal vault too       small to retroflex. Seen well on?"face. It appeared normal. Impression:               - The entire examined colon is normal.                           - No specimens collected. Moderate Sedation:  Moderate (conscious) sedation was personally administered by an       anesthesia professional. The following parameters were monitored: oxygen       saturation, heart rate, blood pressure, respiratory rate, EKG, adequacy       of pulmonary ventilation, and response to care. Recommendation:           - Patient has a contact number available for                            emergencies. The signs and symptoms of potential                            delayed complications were discussed with the                            patient. Return to normal activities tomorrow.                            Written discharge instructions were  provided to the                            patient.                           - Advance diet as tolerated. Repeat screening                            colonoscopy in 10 years. Continue present                            medications Procedure Code(s):        --- Professional ---                           [phone], Colonoscopy, flexible; diagnostic, including                            collection of specimen(s) by brushing or washing,                            when performed (separate procedure) Diagnosis Code(s):        --- Professional ---                           Z12.11, Encounter for screening for malignant                            neoplasm of colon CPT copyright 2022 [employer]. All rights reserved. The codes documented in this report are preliminary and upon coder review may  be revised to meet current compliance requirements. Gerrit Friends. Mylie Mccurley, MD Gennette Pac, MD 03/29/2023 8:09:30 AM This report has been signed electronically. Number of Addenda: 0

## 2023-03-30 ENCOUNTER — Encounter (HOSPITAL_COMMUNITY): Payer: Self-pay | Admitting: Internal Medicine

## 2023-04-21 ENCOUNTER — Ambulatory Visit: Payer: Medicare Other

## 2023-04-21 VITALS — BP 102/52 | Ht 68.0 in | Wt 185.0 lb

## 2023-04-21 DIAGNOSIS — E1159 Type 2 diabetes mellitus with other circulatory complications: Secondary | ICD-10-CM

## 2023-04-21 DIAGNOSIS — Z2821 Immunization not carried out because of patient refusal: Secondary | ICD-10-CM

## 2023-04-21 NOTE — Progress Notes (Signed)
 Because this visit was a virtual/telehealth visit,  certain criteria was not obtained, such a blood pressure, CBG if applicable, and timed get up and go. Any medications not marked as "taking" were not mentioned during the medication reconciliation part of the visit. Any vitals not documented were not able to be obtained due to this being a telehealth visit or patient was unable to self-report a recent blood pressure reading due to a lack of equipment at home via telehealth. Vitals that have been documented are verbally provided by the patient.   Subjective:   Megan Villa is a 68 y.o. who presents for a Medicare Wellness preventive visit.  Visit Complete: Virtual I connected with  Megan Villa on 04/21/23 by a audio enabled telemedicine application and verified that I am speaking with the correct person using two identifiers.  Patient Location: Home  Provider Location: Home Office  I discussed the limitations of evaluation and management by telemedicine. The patient expressed understanding and agreed to proceed.  Vital Signs: Because this visit was a virtual/telehealth visit, some criteria may be missing or patient reported. Any vitals not documented were not able to be obtained and vitals that have been documented are patient reported.  VideoDeclined- This patient declined Librarian, academic. Therefore the visit was completed with audio only.  Persons Participating in Visit: Patient.  AWV Questionnaire: No: Patient Medicare AWV questionnaire was not completed prior to this visit.  Cardiac Risk Factors include: advanced age (>19men, >57 women);diabetes mellitus;hypertension;dyslipidemia     Objective:    Today's Vitals   04/21/23 1516  BP: (!) 102/52  Weight: 185 lb (83.9 kg)  Height: 5\' 8"  (1.727 m)   Body mass index is 28.13 kg/m.     04/21/2023    3:16 PM 03/29/2023    6:46 AM 05/29/2014    7:19 AM  Advanced Directives  Does Patient Have a  Medical Advance Directive? No No No  Would patient like information on creating a medical advance directive? No - Patient declined Yes (MAU/Ambulatory/Procedural Areas - Information given) No - patient declined information    Current Medications (verified) Outpatient Encounter Medications as of 04/21/2023  Medication Sig   amLODipine (NORVASC) 5 MG tablet Take 1 tablet (5 mg total) by mouth daily.   aspirin EC 81 MG tablet Take 81 mg by mouth daily. Swallow whole.   Blood Glucose Monitoring Suppl DEVI 1 each by Does not apply route in the morning, at noon, and at bedtime. May substitute to any manufacturer covered by patient's insurance.   Blood Glucose Monitoring Suppl DEVI 1 each by Does not apply route in the morning, at noon, and at bedtime. May substitute to any manufacturer covered by patient's insurance.   Cholecalciferol (VITAMIN D3 PO) Take 2,000 Units by mouth daily.   hydrochlorothiazide (HYDRODIURIL) 25 MG tablet Take 1 tablet (25 mg total) by mouth daily.   potassium chloride SA (KLOR-CON M20) 20 MEQ tablet Take 1 tablet (20 mEq total) by mouth daily.   rosuvastatin (CRESTOR) 10 MG tablet Take 1 tablet (10 mg total) by mouth daily.   No facility-administered encounter medications on file as of 04/21/2023.    Allergies (verified) Lisinopril and Penicillins   History: Past Medical History:  Diagnosis Date   Annual physical exam 09/11/2015   Constipation    Diabetes mellitus without complication (HCC)    Hyperlipidemia    Hypertension    IGT (impaired glucose tolerance)    Metabolic syndrome X 06/25/2013   Obesity  Seasonal allergies    Past Surgical History:  Procedure Laterality Date   COLONOSCOPY WITH PROPOFOL N/A 03/29/2023   Procedure: COLONOSCOPY WITH PROPOFOL;  Surgeon: Corbin Ade, MD;  Location: [facility];  Service: Endoscopy;  Laterality: N/A;  730am, asa 2   CYSTECTOMY     left thumb    CYSTECTOMY     left elbow    TUBAL LIGATION     Family  History  Problem Relation Age of Onset   Diabetes Mother    Diabetes Father    Hypertension Father    Stroke Father    Diabetes Sister    Diabetes Brother    Diabetes Brother    Arthritis Unknown        family history    Social History   Socioeconomic History   Marital status: Married    Spouse name: Not on file   Number of children: Not on file   Years of education: Not on file   Highest education level: Not on file  Occupational History   Not on file  Tobacco Use   Smoking status: Never   Smokeless tobacco: Never  Vaping Use   Vaping status: Never Used  Substance and Sexual Activity   Alcohol use: No   Drug use: No   Sexual activity: Not Currently  Other Topics Concern   Not on file  Social History Narrative   Not on file   Social Drivers of Health   Financial Resource Strain: Not on file  Food Insecurity: Not on file  Transportation Needs: No Transportation Needs (04/21/2023)   PRAPARE - Administrator, Civil Service (Medical): No    Lack of Transportation (Non-Medical): No  Physical Activity: Sufficiently Active (04/21/2023)   Exercise Vital Sign    Days of Exercise per Week: 7 days    Minutes of Exercise per Session: 30 min  Stress: No Stress Concern Present (04/21/2023)   [employer] of Occupational Health - Occupational Stress Questionnaire    Feeling of Stress : Not at all  Social Connections: Socially Integrated (04/21/2023)   Social Connection and Isolation Panel [NHANES]    Frequency of Communication with Friends and Family: More than three times a week    Frequency of Social Gatherings with Friends and Family: More than three times a week    Attends Religious Services: 1 to 4 times per year    Active Member of [employer] or Organizations: Yes    Attends Banker Meetings: Never    Marital Status: Married    Tobacco Counseling Counseling given: Not Answered    Clinical Intake:  Pre-visit preparation completed: Yes  Pain  : No/denies pain     BMI - recorded: 28.13 Nutritional Status: BMI 25 -29 Overweight Nutritional Risks: None Diabetes: Yes CBG done?: No Did pt. bring in CBG monitor from home?: No  Lab Results  Component Value Date   HGBA1C 6.3 (H) 01/15/2023   HGBA1C 6.3 (H) 08/25/2022   HGBA1C 6.6 (H) 05/25/2022     What is the last grade level you completed in school?: some college  Interpreter Needed?: No  Information entered by :: Alaylah Heatherington,CMA   Activities of Daily Living     04/21/2023    3:23 PM  In your present state of health, do you have any difficulty performing the following activities:  Hearing? 0  Vision? 0  Difficulty concentrating or making decisions? 0  Walking or climbing stairs? 0  Dressing or bathing?  0  Doing errands, shopping? 0  Preparing Food and eating ? N  Using the Toilet? N  In the past six months, have you accidently leaked urine? N  Do you have problems with loss of bowel control? N  Managing your Medications? N  Managing your Finances? N  Housekeeping or managing your Housekeeping? N    Patient Care Team: Kerri Perches, MD as PCP - General  Indicate any recent Medical Services you may have received from other than [facility] providers in the past year (date may be approximate).     Assessment:   This is a routine wellness examination for Megan Villa.  Hearing/Vision screen Hearing Screening - Comments:: No hearing issues Vision Screening - Comments:: No issues with vision   Goals Addressed               This Visit's Progress     Patient Stated (pt-stated)        To get her A1c down        Depression Screen     04/21/2023    3:24 PM 01/19/2023    2:33 PM 08/28/2022    1:07 PM 05/28/2022    1:06 PM 04/09/2022    1:09 PM 03/23/2022    1:10 PM 02/24/2022    8:36 AM  PHQ 2/9 Scores  PHQ - 2 Score 0 0 0 0 1 0 0  PHQ- 9 Score 1          Fall Risk     04/21/2023    3:21 PM 01/19/2023    2:33 PM 08/28/2022    1:07 PM 05/28/2022    1:06  PM 04/09/2022    1:08 PM  Fall Risk   Falls in the past year? 1 0 0 0 1  Number falls in past yr: 0 0 0 0 0  Injury with Fall? 0 0 0 0 0  Risk for fall due to : No Fall Risks;History of fall(s);Impaired balance/gait No Fall Risks No Fall Risks No Fall Risks No Fall Risks  Follow up Falls evaluation completed Falls evaluation completed Falls evaluation completed Falls evaluation completed Falls evaluation completed    MEDICARE RISK AT HOME:   Medicare Risk at Home Any stairs in or around the home?: No If so, are there any without handrails?: No Home free of loose throw rugs in walkways, pet beds, electrical cords, etc?: Yes Adequate lighting in your home to reduce risk of falls?: Yes Life alert?: No Use of a cane, walker or w/c?: No Grab bars in the bathroom?: Yes Shower chair or bench in shower?: Yes Elevated toilet seat or a handicapped toilet?: No  TIMED UP AND GO:  Was the test performed?  No  Cognitive Function: 6CIT completed        04/21/2023    3:19 PM  6CIT Screen  What Year? 0 points  What month? 0 points  What time? 0 points  Count back from 20 0 points  Months in reverse 0 points  Repeat phrase 0 points  Total Score 0 points    Immunizations Immunization History  Administered Date(s) Administered   Fluad Trivalent(High Dose 65+) 10/26/2022   Influenza Split 10/16/2011, 10/18/2013, 11/16/2017   Influenza Whole 11/04/2005, 11/12/2015   Influenza,inj,Quad PF,6+ Mos 10/16/2019, 09/18/2020, 09/17/2021   Influenza-Unspecified 11/15/2018   Moderna SARS-COV2 Booster Vaccination 01/04/2020   Moderna Sars-Covid-2 Vaccination 04/01/2019, 05/03/2019   PNEUMOCOCCAL CONJUGATE-20 04/09/2022   PPD Test 05/25/2022   Tdap 08/05/2010, 09/18/2020   Zoster Recombinant(Shingrix)  05/09/2020, 08/08/2020    Screening Tests Health Maintenance  Topic Date Due   COVID-19 Vaccine (4 - 2024-25 season) 09/20/2022   Fecal DNA (Cologuard)  12/03/2022   Diabetic kidney  evaluation - Urine ACR  02/25/2023   HEMOGLOBIN A1C  07/16/2023   INFLUENZA VACCINE  08/20/2023   FOOT EXAM  08/28/2023   Medicare Annual Wellness (AWV)  08/28/2023   OPHTHALMOLOGY EXAM  09/04/2023   Diabetic kidney evaluation - eGFR measurement  03/23/2024   MAMMOGRAM  11/03/2024   DTaP/Tdap/Td (3 - Td or Tdap) 09/19/2030   Pneumonia Vaccine 50+ Years old  Completed   DEXA SCAN  Completed   Hepatitis C Screening  Completed   Zoster Vaccines- Shingrix  Completed   HPV VACCINES  Aged Out   Colonoscopy  Discontinued    Health Maintenance  Health Maintenance Due  Topic Date Due   COVID-19 Vaccine (4 - 2024-25 season) 09/20/2022   Fecal DNA (Cologuard)  12/03/2022   Diabetic kidney evaluation - Urine ACR  02/25/2023   Health Maintenance Items Addressed: patient has discussed health maintenance issues.  Additional Screening:  Vision Screening: Recommended annual ophthalmology exams for early detection of glaucoma and other disorders of the eye.  Dental Screening: Recommended annual dental exams for proper oral hygiene  Community Resource Referral / Chronic Care Management: CRR required this visit?  No   CCM required this visit?  No     Plan:     I have personally reviewed and noted the following in the patient's chart:   Medical and social history Use of alcohol, tobacco or illicit drugs  Current medications and supplements including opioid prescriptions. Patient is not currently taking opioid prescriptions. Functional ability and status Nutritional status Physical activity Advanced directives List of other physicians Hospitalizations, surgeries, and ER visits in previous 12 months Vitals Screenings to include cognitive, depression, and falls Referrals and appointments  In addition, I have reviewed and discussed with patient certain preventive protocols, quality metrics, and best practice recommendations. A written personalized care plan for preventive services as  well as general preventive health recommendations were provided to patient.     Rudi Heap, [place]   04/21/2023   After Visit Summary: (MyChart) Due to this being a telephonic visit, the after visit summary with patients personalized plan was offered to patient via MyChart   Notes: Nothing significant to report at this time.

## 2023-04-21 NOTE — Patient Instructions (Signed)
 Megan Villa , Thank you for taking time to come for your Medicare Wellness Visit. I appreciate your ongoing commitment to your health goals. Please review the following plan we discussed and let me know if I can assist you in the future.   Referrals/Orders/Follow-Ups/Clinician Recommendations: follow up in one year  This is a list of the screening recommended for you and due dates:  Health Maintenance  Topic Date Due   COVID-19 Vaccine (4 - 2024-25 season) 09/20/2022   Cologuard (Stool DNA test)  12/03/2022   Yearly kidney health urinalysis for diabetes  02/25/2023   Hemoglobin A1C  07/16/2023   Flu Shot  08/20/2023   Complete foot exam   08/28/2023   Medicare Annual Wellness Visit  08/28/2023   Eye exam for diabetics  09/04/2023   Yearly kidney function blood test for diabetes  03/23/2024   Mammogram  11/03/2024   DTaP/Tdap/Td vaccine (3 - Td or Tdap) 09/19/2030   Pneumonia Vaccine  Completed   DEXA scan (bone density measurement)  Completed   Hepatitis C Screening  Completed   Zoster (Shingles) Vaccine  Completed   HPV Vaccine  Aged Out   Colon Cancer Screening  Discontinued    Advanced directives: (Copy Requested) Please bring a copy of your health care power of attorney and living will to the office to be added to your chart at your convenience. You can mail to [facility] [address] [place], [place] [address] or email to [email or web address]  Next Medicare Annual Wellness Visit scheduled for next year: Yes

## 2023-05-27 ENCOUNTER — Encounter (HOSPITAL_COMMUNITY): Payer: Self-pay

## 2023-06-02 DIAGNOSIS — I10 Essential (primary) hypertension: Secondary | ICD-10-CM | POA: Diagnosis not present

## 2023-06-02 DIAGNOSIS — E1159 Type 2 diabetes mellitus with other circulatory complications: Secondary | ICD-10-CM | POA: Diagnosis not present

## 2023-06-02 DIAGNOSIS — E785 Hyperlipidemia, unspecified: Secondary | ICD-10-CM | POA: Diagnosis not present

## 2023-06-02 DIAGNOSIS — E559 Vitamin D deficiency, unspecified: Secondary | ICD-10-CM | POA: Diagnosis not present

## 2023-06-02 NOTE — Telephone Encounter (Unsigned)
 Copied from CRM [id number]. Topic: Appointments - Scheduling Inquiry for Clinic >> Jun 02, 2023  8:17 AM Baldomero Bone wrote: Reason for CRM: Patient is inquiring about if she needs blood work done before her appointment on 06/03/2023. Callback number is [phone]

## 2023-06-03 ENCOUNTER — Other Ambulatory Visit (HOSPITAL_COMMUNITY): Payer: Self-pay | Admitting: Family Medicine

## 2023-06-03 ENCOUNTER — Ambulatory Visit (INDEPENDENT_AMBULATORY_CARE_PROVIDER_SITE_OTHER): Payer: Medicare Other | Admitting: Family Medicine

## 2023-06-03 ENCOUNTER — Encounter: Payer: Self-pay | Admitting: Family Medicine

## 2023-06-03 VITALS — BP 128/70 | HR 72 | Resp 16 | Ht 68.0 in | Wt 193.1 lb

## 2023-06-03 DIAGNOSIS — E1169 Type 2 diabetes mellitus with other specified complication: Secondary | ICD-10-CM | POA: Diagnosis not present

## 2023-06-03 DIAGNOSIS — E663 Overweight: Secondary | ICD-10-CM

## 2023-06-03 DIAGNOSIS — E785 Hyperlipidemia, unspecified: Secondary | ICD-10-CM

## 2023-06-03 DIAGNOSIS — I10 Essential (primary) hypertension: Secondary | ICD-10-CM

## 2023-06-03 DIAGNOSIS — Z1231 Encounter for screening mammogram for malignant neoplasm of breast: Secondary | ICD-10-CM

## 2023-06-03 LAB — LIPID PANEL
Chol/HDL Ratio: 2.4 ratio (ref 0.0–4.4)
Cholesterol, Total: 163 mg/dL (ref 100–199)
HDL: 68 mg/dL (ref >39)
LDL Chol Calc (NIH): 83 mg/dL (ref 0–99)
Triglycerides: 58 mg/dL (ref 0–149)
VLDL Cholesterol Cal: 12 mg/dL (ref 5–40)

## 2023-06-03 LAB — CBC
Hematocrit: 42 % (ref 34.0–46.6)
Hemoglobin: 13.3 g/dL (ref 11.1–15.9)
MCH: 27.7 pg (ref 26.6–33.0)
MCHC: 31.7 g/dL (ref 31.5–35.7)
MCV: 87 fL (ref 79–97)
Platelets: 331 10*3/uL (ref 150–450)
RBC: 4.81 x10E6/uL (ref 3.77–5.28)
RDW: 13.6 % (ref 11.7–15.4)
WBC: 5 10*3/uL (ref 3.4–10.8)

## 2023-06-03 LAB — CMP14+EGFR
ALT: 13 IU/L (ref 0–32)
AST: 17 IU/L (ref 0–40)
Albumin: 4.4 g/dL (ref 3.9–4.9)
Alkaline Phosphatase: 105 IU/L (ref 44–121)
BUN/Creatinine Ratio: 14 (ref 12–28)
BUN: 10 mg/dL (ref 8–27)
Bilirubin Total: 0.3 mg/dL (ref 0.0–1.2)
CO2: 24 mmol/L (ref 20–29)
Calcium: 9.4 mg/dL (ref 8.7–10.3)
Chloride: 103 mmol/L (ref 96–106)
Creatinine, Ser: 0.73 mg/dL (ref 0.57–1.00)
Globulin, Total: 2.4 g/dL (ref 1.5–4.5)
Glucose: 119 mg/dL — ABNORMAL HIGH (ref 70–99)
Potassium: 3.9 mmol/L (ref 3.5–5.2)
Sodium: 143 mmol/L (ref 134–144)
Total Protein: 6.8 g/dL (ref 6.0–8.5)
eGFR: 91 mL/min/{1.73_m2} (ref >59)

## 2023-06-03 LAB — TSH: TSH: 3.25 u[IU]/mL (ref 0.450–4.500)

## 2023-06-03 LAB — HEMOGLOBIN A1C
Est. average glucose Bld gHb Est-mCnc: 137 mg/dL
Hgb A1c MFr Bld: 6.4 % — ABNORMAL HIGH (ref 4.8–5.6)

## 2023-06-03 LAB — VITAMIN D 25 HYDROXY (VIT D DEFICIENCY, FRACTURES): Vit D, 25-Hydroxy: 51.9 ng/mL (ref 30.0–100.0)

## 2023-06-03 NOTE — Patient Instructions (Signed)
 ANNUAL EXAM IN 4 MONTHS, CALL IF YOU NEED ME SOONER  pLEASE SCHEDULE MAMMOGRAM AT CHECKOUT  URINE ACR TODAY , NON FASTING HBA1C, CHEM 7 AND EGFR 3 TO 5 DAYS BEFORE NEXT VISIT  It is important that you exercise regularly at least 30 minutes 5 times a week. If you develop chest pain, have severe difficulty breathing, or feel very tired, stop exercising immediately and seek medical attention   Think about what you will eat, plan ahead. Choose " clean, green, fresh or frozen" over canned, processed or packaged foods which are more sugary, salty and fatty. 70 to 75% of food eaten should be vegetables and fruit. Three meals at set times with snacks allowed between meals, but they must be fruit or vegetables. Aim to eat over a 12 hour period , example 7 am to 7 pm, and STOP after  your last meal of the day. Drink water,generally about 64 ounces per day, no other drink is as healthy. Fruit juice is best enjoyed in a healthy way, by EATING the fruit. Thanks for choosing [facility], we consider it a privelige to serve you.

## 2023-06-05 ENCOUNTER — Ambulatory Visit: Payer: Self-pay | Admitting: Family Medicine

## 2023-06-05 LAB — MICROALBUMIN / CREATININE URINE RATIO
Creatinine, Urine: 37.6 mg/dL
Microalb/Creat Ratio: <8 mg/g{creat} (ref 0–29)
Microalbumin, Urine: <3 ug/mL

## 2023-06-07 ENCOUNTER — Encounter: Payer: Self-pay | Admitting: Family Medicine

## 2023-06-07 NOTE — Assessment & Plan Note (Signed)
 Controlled, no change in medication DASH diet and commitment to daily physical activity for a minimum of 30 minutes discussed and encouraged, as a part of hypertension management. The importance of attaining a healthy weight is also discussed.     06/03/2023    3:38 PM 06/03/2023    2:54 PM 04/21/2023    3:16 PM 03/29/2023    8:01 AM 03/29/2023    7:00 AM 03/29/2023    6:45 AM 01/19/2023    3:06 PM  BP/Weight  Systolic BP 128 134 102 102 154  110  Diastolic BP 70 72 52 52 73  70  Wt. (Lbs)  193.08 185   185   BMI  29.36 kg/m2 28.13 kg/m2   28.13 kg/m2

## 2023-06-07 NOTE — Progress Notes (Signed)
 Megan Villa     MRN: [id number]      DOB: 1955-10-03  Chief Complaint  Patient presents with   Medical Management of Chronic Issues    5 month follow up     HPI Megan Villa is here for follow up and re-evaluation of chronic medical conditions, medication management and review of any available recent lab and radiology data.  Preventive health is updated, specifically  Cancer screening and Immunization.   Had colonoscopy which she states " went extremely well" worse part was the prep. The PT denies any adverse reactions to current medications since the last visit.  Has increased unhealthy snacks on her job, sp blood sugar not as good as she would like, ois exercising reguularly and generally doing well  ROS Denies recent fever or chills. Denies sinus pressure, nasal congestion, ear pain or sore throat. Denies chest congestion, productive cough or wheezing. Denies chest pains, palpitations and leg swelling Denies abdominal pain, nausea, vomiting,diarrhea or constipation.   Denies dysuria, frequency, hesitancy or incontinence. Denies joint pain, swelling and limitation in mobility. Denies headaches, seizures, numbness, or tingling. Denies depression, anxiety or insomnia. Denies skin break down or rash.   PE  BP 128/70   Pulse 72   Resp 16   Ht 5\' 8"  (1.727 m)   Wt 193 lb 1.3 oz (87.6 kg)   SpO2 98%   BMI 29.36 kg/m   Patient alert and oriented and in no cardiopulmonary distress.  HEENT: No facial asymmetry, EOMI,     Neck supple .  Chest: Clear to auscultation bilaterally.  CVS: S1, S2 no murmurs, no S3.Regular rate.  ABD: Soft non tender.   Ext: No edema  MS: Adequate ROM spine, shoulders, hips and knees.  Skin: Intact, no ulcerations or rash noted.  Psych: Good eye contact, normal affect. Memory intact not anxious or depressed appearing.  CNS: CN 2-12 intact, power,  normal throughout.no focal deficits noted.   Assessment & Plan  Type 2 diabetes  mellitus with other specified complication (HCC) Diabetes associated with hypertension, hyperlipidemia, and obesity  Megan Villa is reminded of the importance of commitment to daily physical activity for 30 minutes or more, as able and the need to limit carbohydrate intake to 30 to 60 grams per meal to help with blood sugar control.   No change in current management Megan Villa is reminded of the importance of daily foot exam, annual eye examination, and good blood sugar, blood pressure and cholesterol control.     Latest Ref Rng & Units 06/03/2023    3:54 PM 06/02/2023    9:08 AM 03/24/2023    3:39 PM 01/15/2023    9:05 AM 08/25/2022    9:21 AM  Diabetic Labs  HbA1c 4.8 - 5.6 %  6.4   6.3  6.3   Micro/Creat Ratio 0 - 29 mg/g creat <8       Chol 100 - 199 mg/dL  409    811   HDL >91 mg/dL  68    62   Calc LDL 0 - 99 mg/dL  83    74   Triglycerides 0 - 149 mg/dL  58    69   Creatinine 0.57 - 1.00 mg/dL  4.78  2.95  6.21  3.08       06/03/2023    3:38 PM 06/03/2023    2:54 PM 04/21/2023    3:16 PM 03/29/2023    8:01 AM 03/29/2023    7:00 AM 03/29/2023  6:45 AM 01/19/2023    3:06 PM  BP/Weight  Systolic BP 128 134 102 102 154  110  Diastolic BP 70 72 52 52 73  70  Wt. (Lbs)  193.08 185   185   BMI  29.36 kg/m2 28.13 kg/m2   28.13 kg/m2       Latest Ref Rng & Units 09/04/2022    9:12 AM 08/28/2022    1:00 PM  Foot/eye exam completion dates  Eye Exam No Retinopathy No Retinopathy       Foot Form Completion   Done     This result is from an external source.        Essential hypertension Controlled, no change in medication DASH diet and commitment to daily physical activity for a minimum of 30 minutes discussed and encouraged, as a part of hypertension management. The importance of attaining a healthy weight is also discussed.     06/03/2023    3:38 PM 06/03/2023    2:54 PM 04/21/2023    3:16 PM 03/29/2023    8:01 AM 03/29/2023    7:00 AM 03/29/2023    6:45 AM 01/19/2023     3:06 PM  BP/Weight  Systolic BP 128 134 102 102 154  110  Diastolic BP 70 72 52 52 73  70  Wt. (Lbs)  193.08 185   185   BMI  29.36 kg/m2 28.13 kg/m2   28.13 kg/m2        Hyperlipidemia LDL goal <100 Hyperlipidemia:Low fat diet discussed and encouraged.   Lipid Panel  Lab Results  Component Value Date   CHOL 163 06/02/2023   HDL 68 06/02/2023   LDLCALC 83 06/02/2023   TRIG 58 06/02/2023   CHOLHDL 2.4 06/02/2023     Controlled, no change in medication   Overweight (BMI 25.0-29.9)  Patient re-educated about  the importance of commitment to a  minimum of 150 minutes of exercise per week as able.  The importance of healthy food choices with portion control discussed, as well as eating regularly and within a 12 hour window most days. The need to choose "clean , green" food 50 to 75% of the time is discussed, as well as to make water the primary drink and set a goal of 64 ounces water daily.       06/03/2023    2:54 PM 04/21/2023    3:16 PM 03/29/2023    6:45 AM  Weight /BMI  Weight 193 lb 1.3 oz 185 lb 185 lb  Height 5\' 8"  (1.727 m) 5\' 8"  (1.727 m) 5\' 8"  (1.727 m)  BMI 29.36 kg/m2 28.13 kg/m2 28.13 kg/m2    Has gained 8 pounds and will change diet to reverse this

## 2023-06-07 NOTE — Assessment & Plan Note (Signed)
 Diabetes associated with hypertension, hyperlipidemia, and obesity  Megan Villa is reminded of the importance of commitment to daily physical activity for 30 minutes or more, as able and the need to limit carbohydrate intake to 30 to 60 grams per meal to help with blood sugar control.   No change in current management Megan Villa is reminded of the importance of daily foot exam, annual eye examination, and good blood sugar, blood pressure and cholesterol control.     Latest Ref Rng & Units 06/03/2023    3:54 PM 06/02/2023    9:08 AM 03/24/2023    3:39 PM 01/15/2023    9:05 AM 08/25/2022    9:21 AM  Diabetic Labs  HbA1c 4.8 - 5.6 %  6.4   6.3  6.3   Micro/Creat Ratio 0 - 29 mg/g creat <8       Chol 100 - 199 mg/dL  119    147   HDL >82 mg/dL  68    62   Calc LDL 0 - 99 mg/dL  83    74   Triglycerides 0 - 149 mg/dL  58    69   Creatinine 0.57 - 1.00 mg/dL  9.56  2.13  0.86  5.78       06/03/2023    3:38 PM 06/03/2023    2:54 PM 04/21/2023    3:16 PM 03/29/2023    8:01 AM 03/29/2023    7:00 AM 03/29/2023    6:45 AM 01/19/2023    3:06 PM  BP/Weight  Systolic BP 128 134 102 102 154  110  Diastolic BP 70 72 52 52 73  70  Wt. (Lbs)  193.08 185   185   BMI  29.36 kg/m2 28.13 kg/m2   28.13 kg/m2       Latest Ref Rng & Units 09/04/2022    9:12 AM 08/28/2022    1:00 PM  Foot/eye exam completion dates  Eye Exam No Retinopathy No Retinopathy       Foot Form Completion   Done     This result is from an external source.

## 2023-06-07 NOTE — Assessment & Plan Note (Signed)
 Hyperlipidemia:Low fat diet discussed and encouraged.   Lipid Panel  Lab Results  Component Value Date   CHOL 163 06/02/2023   HDL 68 06/02/2023   LDLCALC 83 06/02/2023   TRIG 58 06/02/2023   CHOLHDL 2.4 06/02/2023     Controlled, no change in medication

## 2023-06-07 NOTE — Assessment & Plan Note (Signed)
  Patient re-educated about  the importance of commitment to a  minimum of 150 minutes of exercise per week as able.  The importance of healthy food choices with portion control discussed, as well as eating regularly and within a 12 hour window most days. The need to choose "clean , green" food 50 to 75% of the time is discussed, as well as to make water the primary drink and set a goal of 64 ounces water daily.       06/03/2023    2:54 PM 04/21/2023    3:16 PM 03/29/2023    6:45 AM  Weight /BMI  Weight 193 lb 1.3 oz 185 lb 185 lb  Height 5\' 8"  (1.727 m) 5\' 8"  (1.727 m) 5\' 8"  (1.727 m)  BMI 29.36 kg/m2 28.13 kg/m2 28.13 kg/m2    Has gained 8 pounds and will change diet to reverse this

## 2023-07-05 ENCOUNTER — Ambulatory Visit: Admitting: Physician Assistant

## 2023-08-18 ENCOUNTER — Other Ambulatory Visit: Payer: Self-pay | Admitting: Family Medicine

## 2023-08-18 NOTE — Telephone Encounter (Signed)
 Copied from CRM #[id number]. Topic: Clinical - Medication Refill >> Aug 18, 2023 10:19 AM Fonda T wrote: Medication: amLODipine  (NORVASC ) 5 MG tablet  Has the patient contacted their pharmacy? Yes, advised per pharmacy to contact office, as was out refills  This is the patient's preferred pharmacy:  [facility] [address], [place] - [address] [place] [address] Phone: [phone] Fax: [phone]  Is this the correct pharmacy for this prescription? Yes If no, delete pharmacy and type the correct one.   Has the prescription been filled recently? Yes  Is the patient out of the medication? No  Has the patient been seen for an appointment in the last year OR does the patient have an upcoming appointment? Yes  Can we respond through MyChart? No, patient prefers a phone call  Agent: Please be advised that Rx refills may take up to 3 business days. We ask that you follow-up with your pharmacy.

## 2023-09-09 ENCOUNTER — Telehealth: Payer: Self-pay | Admitting: Family Medicine

## 2023-09-09 ENCOUNTER — Other Ambulatory Visit: Payer: Self-pay

## 2023-09-09 MED ORDER — LANCET DEVICE MISC
1.0000 | Freq: Three times a day (TID) | 0 refills | Status: DC
Start: 2023-09-09 — End: 2023-10-04

## 2023-09-09 MED ORDER — BLOOD GLUCOSE TEST VI STRP: 1.0000 | ORAL_STRIP | Freq: Three times a day (TID) | 0 refills | Status: DC

## 2023-09-09 MED ORDER — LANCETS MISC. MISC: 1.0000 | Freq: Three times a day (TID) | 0 refills | Status: DC

## 2023-09-09 MED ORDER — BLOOD GLUCOSE MONITORING SUPPL DEVI: 1.0000 | Freq: Three times a day (TID) | 0 refills | Status: DC

## 2023-09-09 NOTE — Telephone Encounter (Signed)
 Sent!

## 2023-09-09 NOTE — Telephone Encounter (Signed)
 Patient here needing a new Rx for a different glucose monitor they are discontinuing hers. Faxed to [phone] along with office notes from last visit. Please advise Thank you

## 2023-09-29 NOTE — Telephone Encounter (Signed)
 Patient here says that fax number was wrong and needs to be resent to [phone]

## 2023-09-30 ENCOUNTER — Other Ambulatory Visit: Payer: Self-pay | Admitting: Family Medicine

## 2023-09-30 MED ORDER — POTASSIUM CHLORIDE CRYS ER 20 MEQ PO TBCR: 20.0000 meq | EXTENDED_RELEASE_TABLET | Freq: Every day | ORAL | 3 refills | Status: AC

## 2023-09-30 NOTE — Telephone Encounter (Signed)
 Copied from CRM #[id number]. Topic: Clinical - Medication Refill >> Sep 30, 2023  2:59 PM DeAngela L wrote: Medication: potassium chloride  SA (KLOR-CON  M20) 20 MEQ tablet  Has the patient contacted their pharmacy? No  (Agent: If no, request that the patient contact the pharmacy for the refill. If patient does not wish to contact the pharmacy document the reason why and proceed with request.) (Agent: If yes, when and what did the pharmacy advise?)  This is the patient's preferred pharmacy:  [facility] [address], [place] - [address] [place] [address] Phone: [phone] Fax: [phone]  Is this the correct pharmacy for this prescription? Yes  If no, delete pharmacy and type the correct one.   Has the prescription been filled recently? Yes   Is the patient out of the medication? no  Has the patient been seen for an appointment in the last year OR does the patient have an upcoming appointment? Yes   Can we respond through MyChart? No  Agent: Please be advised that Rx refills may take up to 3 business days. We ask that you follow-up with your pharmacy.

## 2023-10-04 ENCOUNTER — Other Ambulatory Visit: Payer: Self-pay

## 2023-10-04 MED ORDER — BLOOD GLUCOSE MONITORING SUPPL DEVI: 1.0000 | Freq: Three times a day (TID) | 0 refills | Status: DC

## 2023-10-04 MED ORDER — LANCET DEVICE MISC: 1.0000 | Freq: Three times a day (TID) | 0 refills | Status: DC

## 2023-10-04 MED ORDER — BLOOD GLUCOSE TEST VI STRP: 1.0000 | ORAL_STRIP | Freq: Three times a day (TID) | 0 refills | Status: DC

## 2023-10-04 MED ORDER — LANCETS MISC. MISC: 1.0000 | Freq: Three times a day (TID) | 0 refills | Status: DC

## 2023-10-04 NOTE — Telephone Encounter (Signed)
Re-sent to correct fax number.

## 2023-10-08 ENCOUNTER — Encounter: Admitting: Family Medicine

## 2023-10-14 ENCOUNTER — Telehealth: Payer: Self-pay

## 2023-10-14 ENCOUNTER — Telehealth: Payer: Self-pay | Admitting: Family Medicine

## 2023-10-14 NOTE — Telephone Encounter (Signed)
 Copied from CRM [id number]. Topic: Clinical - Medication Question >> Oct 14, 2023  2:23 PM Megan Villa wrote: Reason for CRM: Patient called.. would like to speak w/her pcp or her nurse.. concerning test strips / lancets? Not to do w/refills.. but needed to speak with either or

## 2023-10-14 NOTE — Telephone Encounter (Signed)
 Spoke to patient

## 2023-10-14 NOTE — Telephone Encounter (Signed)
 Copied from CRM [id number]. Topic: Clinical - Medication Question >> Oct 14, 2023  2:23 PM Emylou G wrote: Reason for CRM: Patient called.. would like to speak w/her pcp or her nurse.. concerning test strips / lancets? Not to do w/refills.. but needed to speak with either or

## 2023-10-14 NOTE — Telephone Encounter (Signed)
 Pt test strips are not compatable with her monitor.pls reach out to the pharmacy thanks

## 2023-10-15 ENCOUNTER — Other Ambulatory Visit: Payer: Self-pay

## 2023-10-15 MED ORDER — BLOOD GLUCOSE MONITORING SUPPL DEVI: 1.0000 | Freq: Three times a day (TID) | 0 refills | Status: AC

## 2023-10-15 MED ORDER — BLOOD GLUCOSE TEST VI STRP: 1.0000 | ORAL_STRIP | Freq: Three times a day (TID) | 0 refills | Status: DC

## 2023-10-15 MED ORDER — LANCET DEVICE MISC: 1.0000 | Freq: Three times a day (TID) | 0 refills | Status: AC

## 2023-10-15 MED ORDER — LANCETS MISC. MISC: 1.0000 | Freq: Three times a day (TID) | 0 refills | Status: AC

## 2023-10-15 NOTE — Telephone Encounter (Signed)
 Lvm to cb, need more information

## 2023-10-27 ENCOUNTER — Ambulatory Visit (INDEPENDENT_AMBULATORY_CARE_PROVIDER_SITE_OTHER)

## 2023-10-27 DIAGNOSIS — Z23 Encounter for immunization: Secondary | ICD-10-CM

## 2023-10-27 NOTE — Progress Notes (Signed)
 Patient is in office today for a nurse visit for Immunization. Patient Injection was given in the  Right deltoid. Patient tolerated injection well.

## 2023-11-08 ENCOUNTER — Ambulatory Visit (HOSPITAL_COMMUNITY)
Admission: RE | Admit: 2023-11-08 | Discharge: 2023-11-08 | Disposition: A | Discharge status: Home / Self Care | Source: Ambulatory Visit | Attending: Family Medicine | Admitting: Family Medicine

## 2023-11-08 ENCOUNTER — Encounter (HOSPITAL_COMMUNITY): Payer: Self-pay

## 2023-11-08 DIAGNOSIS — Z1231 Encounter for screening mammogram for malignant neoplasm of breast: Secondary | ICD-10-CM | POA: Diagnosis not present

## 2023-11-15 ENCOUNTER — Other Ambulatory Visit: Payer: Self-pay | Admitting: Family Medicine

## 2023-11-16 ENCOUNTER — Other Ambulatory Visit: Payer: Self-pay | Admitting: Family Medicine

## 2023-11-17 ENCOUNTER — Ambulatory Visit: Payer: Self-pay

## 2023-12-29 ENCOUNTER — Other Ambulatory Visit: Payer: Self-pay | Admitting: Family Medicine

## 2024-01-19 ENCOUNTER — Encounter: Admitting: Family Medicine

## 2024-01-26 ENCOUNTER — Ambulatory Visit: Admitting: Physician Assistant

## 2024-02-07 ENCOUNTER — Encounter: Payer: Self-pay | Admitting: Family Medicine

## 2024-02-07 ENCOUNTER — Telehealth: Payer: Self-pay | Admitting: Family Medicine

## 2024-02-07 MED ORDER — MONTELUKAST SODIUM 10 MG PO TABS: 10.0000 mg | ORAL_TABLET | Freq: Every day | ORAL | 2 refills | Status: AC

## 2024-02-07 NOTE — Telephone Encounter (Signed)
 Copied from CRM #[id number]. Topic: General - Other >> Feb 07, 2024  3:02 PM Zebedee SAUNDERS wrote: Reason for CRM: Pt would like a call [phone] back regarding montelukast  (SINGULAIR ) 10 MG tablet.  Pt has some questions about medication.

## 2024-02-08 NOTE — Telephone Encounter (Signed)
 [email or web address]

## 2024-02-11 ENCOUNTER — Ambulatory Visit: Payer: Self-pay

## 2024-02-11 NOTE — Telephone Encounter (Signed)
 Pt informed

## 2024-02-11 NOTE — Telephone Encounter (Signed)
 FYI Only or Action Required?: Action required by provider: request for appointment.  Patient was last seen in primary care on 06/03/2023 by Megan Villa BRAVO, MD.  Called Nurse Triage reporting Cough.  Symptoms began a week ago.  Interventions attempted: OTC medications: not helping.  Symptoms are: unchanged.Burning in throat, cough worse at night. Feels like she may have acid reflux. Asking to be worked in today. Please advise pt.  Triage Disposition: See Physician Within 24 Hours  Patient/caregiver understands and will follow disposition?: Yes    Reason for Triage: patient says feels like throat is burning, has bad cough at night and singulair  doesn't seem to help   Reason for Disposition  [1] Continuous (nonstop) coughing interferes with work or school AND [2] no improvement using cough treatment per Care Advice  Answer Assessment - Initial Assessment Questions 1. ONSET: When did the cough begin?      1 week 2. SEVERITY: How bad is the cough today?      Worse at night 3. SPUTUM: Describe the color of your sputum (e.g., none, dry cough; clear, white, yellow, green)     no 4. HEMOPTYSIS: Are you coughing up any blood? If Yes, ask: How much? (e.g., flecks, streaks, tablespoons, etc.)     no 5. DIFFICULTY BREATHING: Are you having difficulty breathing? If Yes, ask: How bad is it? (e.g., mild, moderate, severe)      no 6. FEVER: Do you have a fever? If Yes, ask: What is your temperature, how was it measured, and when did it start?     no 7. CARDIAC HISTORY: Do you have any history of heart disease? (e.g., heart attack, congestive heart failure)      no 8. LUNG HISTORY: Do you have any history of lung disease?  (e.g., pulmonary embolus, asthma, emphysema)     no 9. PE RISK FACTORS: Do you have a history of blood clots? (or: recent major surgery, recent prolonged travel, bedridden)     no 10. OTHER SYMPTOMS: Do you have any other symptoms? (e.g., runny  nose, wheezing, chest pain)       Sore throat 11. PREGNANCY: Is there any chance you are pregnant? When was your last menstrual period?       no 12. TRAVEL: Have you traveled out of the country in the last month? (e.g., travel history, exposures)       no  Protocols used: Cough - Acute Non-Productive-A-AH

## 2024-02-16 ENCOUNTER — Encounter: Payer: Self-pay | Admitting: Family Medicine

## 2024-02-16 ENCOUNTER — Ambulatory Visit: Admitting: Family Medicine

## 2024-02-16 VITALS — BP 134/80 | HR 56 | Resp 18 | Ht 68.0 in | Wt 184.0 lb

## 2024-02-16 DIAGNOSIS — E1169 Type 2 diabetes mellitus with other specified complication: Secondary | ICD-10-CM | POA: Diagnosis not present

## 2024-02-16 DIAGNOSIS — J302 Other seasonal allergic rhinitis: Secondary | ICD-10-CM

## 2024-02-16 DIAGNOSIS — Z9189 Other specified personal risk factors, not elsewhere classified: Secondary | ICD-10-CM

## 2024-02-16 DIAGNOSIS — K219 Gastro-esophageal reflux disease without esophagitis: Secondary | ICD-10-CM | POA: Diagnosis not present

## 2024-02-16 DIAGNOSIS — Z0001 Encounter for general adult medical examination with abnormal findings: Secondary | ICD-10-CM | POA: Diagnosis not present

## 2024-02-16 DIAGNOSIS — R051 Acute cough: Secondary | ICD-10-CM

## 2024-02-16 MED ORDER — FLUTICASONE PROPIONATE 50 MCG/ACT NA SUSP: 2.0000 | Freq: Every day | NASAL | 6 refills | Status: AC

## 2024-02-16 MED ORDER — CHLORPHENIRAMINE MALEATE 4 MG PO TABS: 4.0000 mg | ORAL_TABLET | Freq: Two times a day (BID) | ORAL | 0 refills | Status: AC | PRN

## 2024-02-16 MED ORDER — FLUCONAZOLE 150 MG PO TABS: 150.0000 mg | ORAL_TABLET | Freq: Once | ORAL | 0 refills | Status: AC

## 2024-02-16 MED ORDER — AZITHROMYCIN 250 MG PO TABS: ORAL_TABLET | ORAL | 0 refills | Status: AC

## 2024-02-16 NOTE — Progress Notes (Signed)
 "   Megan Villa     MRN: [id number]      DOB: 10/21/1955  Chief Complaint  Patient presents with   Annual Exam    Cpe    Nasal Congestion    Pt complains of sinus drainage and dry cough x1 week     HPI: Patient is in for annual physical exam. Nasal congestion, sinus drainage and 1 week h/o cough Recent labs,  are reviewed. Immunization is reviewed , and  updated if needed.   PE: BP 134/80   Pulse (!) 56   Resp 18   Ht 5' 8 (1.727 m)   Wt 184 lb 0.6 oz (83.5 kg)   SpO2 96%   BMI 27.98 kg/m   Pleasant  female, alert and oriented x 3, in no cardio-pulmonary distress. Afebrile. HEENT No facial trauma or asymetry. Sinuses non tender. Nasal and sinus  congestion  Extra occullar muscles intact..Left TM erythematous, good light reflex External ears normal, . Neck: supple, no adenopathy,JVD or thyromegaly.No bruits.  Chest: Clear to ascultation bilaterally.No crackles or wheezes. Non tender to palpation   Cardiovascular system; Heart sounds normal,  S1 and  S2 ,no S3.  No murmur, or thrill. Peripheral pulses normal.  Abdomen: Soft, non tender, no organomegaly or masses. No bruits. Bowel sounds normal. No guarding, tenderness or rebound.  .   Musculoskeletal exam: Full ROM of spine, hips , shoulders and knees. No deformity ,swelling or crepitus noted. No muscle wasting or atrophy.   Neurologic: Cranial nerves 2 to 12 intact. Power, tone ,sensation and reflexes normal throughout. No disturbance in gait. No tremor.  Skin: Intact, no ulceration, erythema , scaling or rash noted. Pigmentation normal throughout  Psych; Normal mood and affect. Judgement and concentration normal   Assessment & Plan:  Type 2 diabetes mellitus with other specified complication (HCC) Diabetes associated with hypertension, hyperlipidemia, and obesity  Megan Villa is reminded of the importance of commitment to daily physical activity for 30 minutes or more, as able and the  need to limit carbohydrate intake to 30 to 60 grams per meal to help with blood sugar control.   The need to take medication as prescribed, test blood sugar as directed, and to call between visits if there is a concern that blood sugar is uncontrolled is also discussed.   Megan Villa is reminded of the importance of daily foot exam, annual eye examination, and good blood sugar, blood pressure and cholesterol control.     Latest Ref Rng & Units 06/03/2023    3:54 PM 06/02/2023    9:08 AM 03/24/2023    3:39 PM 01/15/2023    9:05 AM 08/25/2022    9:21 AM  Diabetic Labs  HbA1c 4.8 - 5.6 %  6.4   6.3  6.3   Micro/Creat Ratio 0 - 29 mg/g creat <8       Chol 100 - 199 mg/dL  836    849   HDL >60 mg/dL  68    62   Calc LDL 0 - 99 mg/dL  83    74   Triglycerides 0 - 149 mg/dL  58    69   Creatinine 0.57 - 1.00 mg/dL  9.26  9.28  9.27  9.21       02/16/2024    9:30 AM 06/03/2023    3:38 PM 06/03/2023    2:54 PM 04/21/2023    3:16 PM 03/29/2023    8:01 AM 03/29/2023    7:00 AM 03/29/2023  6:45 AM  BP/Weight  Systolic BP 135 128 134 102 102 154   Diastolic BP 76 70 72 52 52 73   Wt. (Lbs) 184.04  193.08 185   185  BMI 27.98 kg/m2  29.36 kg/m2 28.13 kg/m2   28.13 kg/m2      Latest Ref Rng & Units 09/04/2022    9:12 AM 08/28/2022    1:00 PM  Foot/eye exam completion dates  Eye Exam No Retinopathy No Retinopathy       Foot Form Completion   Done     This result is from an external source.        Encounter for Medicare annual examination with abnormal findings Annual exam as documented. Counseling done  re healthy lifestyle involving commitment to 150 minutes exercise per week, heart healthy diet, and attaining healthy weight.The importance of adequate sleep also discussed. Immunization and cancer screening needs are specifically addressed at this visit.   Seasonal allergies Uncontrolled, avoid excessive cold exposure Commit to daily singulair  and flonase  Chlorpheniramine  one to two  daily for short term use for excess post nasal drainage  Acute cough 1 week h/o worsening with yellow sputum at times, Z pack prescribed Advised to stop cough drops also  GERD (gastroesophageal reflux disease) Intermittent symptoms, notes that certain foods trigger, advised to avoid triggers and use prilosec as needed,   At increased risk for cardiovascular disease Obtain coronary calcium  score based on co morbidities , also c/o intermittent chest discomfort, cardiology eval warranted if abnormal she is already on statins  "

## 2024-02-16 NOTE — Assessment & Plan Note (Signed)
 Annual exam as documented. Counseling done  re healthy lifestyle involving commitment to 150 minutes exercise per week, heart healthy diet, and attaining healthy weight.The importance of adequate sleep also discussed.  Immunization and cancer screening needs are specifically addressed at this visit.

## 2024-02-16 NOTE — Assessment & Plan Note (Addendum)
 Diabetes associated with hypertension, hyperlipidemia, and obesity  Ms. Iverson is reminded of the importance of commitment to daily physical activity for 30 minutes or more, as able and the need to limit carbohydrate intake to 30 to 60 grams per meal to help with blood sugar control.   The need to take medication as prescribed, test blood sugar as directed, and to call between visits if there is a concern that blood sugar is uncontrolled is also discussed.   Ms. Rodak is reminded of the importance of daily foot exam, annual eye examination, and good blood sugar, blood pressure and cholesterol control.     Latest Ref Rng & Units 06/03/2023    3:54 PM 06/02/2023    9:08 AM 03/24/2023    3:39 PM 01/15/2023    9:05 AM 08/25/2022    9:21 AM  Diabetic Labs  HbA1c 4.8 - 5.6 %  6.4   6.3  6.3   Micro/Creat Ratio 0 - 29 mg/g creat <8       Chol 100 - 199 mg/dL  836    849   HDL >60 mg/dL  68    62   Calc LDL 0 - 99 mg/dL  83    74   Triglycerides 0 - 149 mg/dL  58    69   Creatinine 0.57 - 1.00 mg/dL  9.26  9.28  9.27  9.21       02/16/2024    9:30 AM 06/03/2023    3:38 PM 06/03/2023    2:54 PM 04/21/2023    3:16 PM 03/29/2023    8:01 AM 03/29/2023    7:00 AM 03/29/2023    6:45 AM  BP/Weight  Systolic BP 135 128 134 102 102 154   Diastolic BP 76 70 72 52 52 73   Wt. (Lbs) 184.04  193.08 185   185  BMI 27.98 kg/m2  29.36 kg/m2 28.13 kg/m2   28.13 kg/m2      Latest Ref Rng & Units 09/04/2022    9:12 AM 08/28/2022    1:00 PM  Foot/eye exam completion dates  Eye Exam No Retinopathy No Retinopathy       Foot Form Completion   Done     This result is from an external source.

## 2024-02-16 NOTE — Assessment & Plan Note (Signed)
 Obtain coronary calcium  score based on co morbidities , also c/o intermittent chest discomfort, cardiology eval warranted if abnormal she is already on statins

## 2024-02-16 NOTE — Patient Instructions (Addendum)
" °  F/u end August   Labs today already ordered   Pls call and schedule your diabetic eye exam with Dr Darroll ( [facility] on [address]  You are referred for cardiac scan schedule in April per your request  please  No cough drops  Please exercise inside while cold, I believe you are sensitive to the cold temps for prolonged exposure contributing to this cough  Azithromycin  , and chlorpheniramine  and fluconazole  prescribed for excess drainage and cough  Flonase  and montelukast  use every day as long  you have allergy symptom  Prilosec use once daily IF NEEDED for heartburn, pls avoid food that provoke heartburn as well as chocolate "

## 2024-02-16 NOTE — Assessment & Plan Note (Signed)
 Intermittent symptoms, notes that certain foods trigger, advised to avoid triggers and use prilosec as needed,

## 2024-02-16 NOTE — Assessment & Plan Note (Signed)
 1 week h/o worsening with yellow sputum at times, Z pack prescribed Advised to stop cough drops also

## 2024-02-16 NOTE — Assessment & Plan Note (Signed)
 Uncontrolled, avoid excessive cold exposure Commit to daily singulair  and flonase  Chlorpheniramine  one to two daily for short term use for excess post nasal drainage

## 2024-02-17 LAB — CMP14+EGFR
ALT: 8 [IU]/L (ref 0–32)
AST: 15 [IU]/L (ref 0–40)
Albumin: 4.5 g/dL (ref 3.9–4.9)
Alkaline Phosphatase: 103 [IU]/L (ref 49–135)
BUN/Creatinine Ratio: 11 — ABNORMAL LOW (ref 12–28)
BUN: 9 mg/dL (ref 8–27)
Bilirubin Total: 0.3 mg/dL (ref 0.0–1.2)
CO2: 23 mmol/L (ref 20–29)
Calcium: 9.7 mg/dL (ref 8.7–10.3)
Chloride: 100 mmol/L (ref 96–106)
Creatinine, Ser: 0.83 mg/dL (ref 0.57–1.00)
Globulin, Total: 2.9 g/dL (ref 1.5–4.5)
Glucose: 139 mg/dL — ABNORMAL HIGH (ref 70–99)
Potassium: 4.2 mmol/L (ref 3.5–5.2)
Sodium: 140 mmol/L (ref 134–144)
Total Protein: 7.4 g/dL (ref 6.0–8.5)
eGFR: 78 mL/min/{1.73_m2}

## 2024-02-17 LAB — HEMOGLOBIN A1C
Est. average glucose Bld gHb Est-mCnc: 137 mg/dL
Hgb A1c MFr Bld: 6.4 % — ABNORMAL HIGH (ref 4.8–5.6)

## 2024-02-21 ENCOUNTER — Other Ambulatory Visit: Payer: Self-pay

## 2024-02-21 MED ORDER — AMLODIPINE BESYLATE 5 MG PO TABS: 5.0000 mg | ORAL_TABLET | Freq: Every day | ORAL | 0 refills | Status: AC

## 2024-02-21 MED ORDER — HYDROCHLOROTHIAZIDE 25 MG PO TABS: 25.0000 mg | ORAL_TABLET | Freq: Every day | ORAL | 0 refills | Status: AC

## 2024-02-21 MED ORDER — ROSUVASTATIN CALCIUM 10 MG PO TABS: 10.0000 mg | ORAL_TABLET | Freq: Every day | ORAL | 3 refills | Status: AC

## 2024-02-21 NOTE — Telephone Encounter (Signed)
 Refills sent to pharmacy.

## 2024-02-21 NOTE — Telephone Encounter (Signed)
 Note made in chart of adverse reaction reported regarding azithromycin . I will recommend fluids for pt to ensure not deb hydrated  Of note syncope reported

## 2024-02-29 ENCOUNTER — Encounter: Admitting: Family Medicine

## 2024-03-17 ENCOUNTER — Encounter: Admitting: Family Medicine

## 2024-04-25 ENCOUNTER — Ambulatory Visit

## 2024-05-16 ENCOUNTER — Other Ambulatory Visit (HOSPITAL_COMMUNITY)

## 2024-09-13 ENCOUNTER — Ambulatory Visit: Payer: Self-pay | Admitting: Family Medicine

## 2024-10-09 ENCOUNTER — Ambulatory Visit: Admitting: Physician Assistant
